# Patient Record
Sex: Male | Born: 1935
Health system: Southern US, Community
[De-identification: ages and names within clinical notes are randomized; demographics above are authoritative.]

## PROBLEM LIST (undated history)

## (undated) DIAGNOSIS — N189 Chronic kidney disease, unspecified: Secondary | ICD-10-CM

## (undated) DIAGNOSIS — N400 Enlarged prostate without lower urinary tract symptoms: Secondary | ICD-10-CM

## (undated) DIAGNOSIS — I10 Essential (primary) hypertension: Secondary | ICD-10-CM

## (undated) HISTORY — PX: TONSILLECTOMY: SUR1361

---

## 2004-08-21 ENCOUNTER — Inpatient Hospital Stay: Payer: Self-pay | Admitting: Internal Medicine

## 2004-08-21 ENCOUNTER — Other Ambulatory Visit: Payer: Self-pay

## 2009-04-29 ENCOUNTER — Ambulatory Visit: Payer: Self-pay | Admitting: Unknown Physician Specialty

## 2011-04-19 ENCOUNTER — Other Ambulatory Visit: Payer: Self-pay | Admitting: Physician Assistant

## 2012-10-18 DIAGNOSIS — I82409 Acute embolism and thrombosis of unspecified deep veins of unspecified lower extremity: Secondary | ICD-10-CM

## 2012-10-18 HISTORY — DX: Acute embolism and thrombosis of unspecified deep veins of unspecified lower extremity: I82.409

## 2017-10-18 DIAGNOSIS — I499 Cardiac arrhythmia, unspecified: Secondary | ICD-10-CM

## 2017-10-18 HISTORY — DX: Cardiac arrhythmia, unspecified: I49.9

## 2020-01-17 ENCOUNTER — Other Ambulatory Visit: Payer: Self-pay | Admitting: Student

## 2020-01-17 ENCOUNTER — Other Ambulatory Visit: Payer: Self-pay

## 2020-01-17 ENCOUNTER — Other Ambulatory Visit: Payer: Self-pay | Admitting: Family Medicine

## 2020-01-17 ENCOUNTER — Ambulatory Visit
Admission: RE | Admit: 2020-01-17 | Discharge: 2020-01-17 | Disposition: A | Discharge status: Home / Self Care | Payer: Medicare Other | Source: Ambulatory Visit | Attending: Family Medicine | Admitting: Family Medicine

## 2020-01-17 DIAGNOSIS — M542 Cervicalgia: Secondary | ICD-10-CM | POA: Insufficient documentation

## 2020-01-17 DIAGNOSIS — I6782 Cerebral ischemia: Secondary | ICD-10-CM | POA: Diagnosis not present

## 2020-01-17 DIAGNOSIS — W19XXXA Unspecified fall, initial encounter: Secondary | ICD-10-CM

## 2020-01-17 DIAGNOSIS — S12001A Unspecified nondisplaced fracture of first cervical vertebra, initial encounter for closed fracture: Secondary | ICD-10-CM

## 2020-01-17 DIAGNOSIS — R519 Headache, unspecified: Secondary | ICD-10-CM | POA: Diagnosis present

## 2020-01-17 DIAGNOSIS — Y939 Activity, unspecified: Secondary | ICD-10-CM | POA: Insufficient documentation

## 2020-01-17 DIAGNOSIS — Y92538 Other ambulatory health services establishments as the place of occurrence of the external cause: Secondary | ICD-10-CM | POA: Insufficient documentation

## 2020-01-17 DIAGNOSIS — S12000A Unspecified displaced fracture of first cervical vertebra, initial encounter for closed fracture: Secondary | ICD-10-CM | POA: Diagnosis not present

## 2020-01-21 ENCOUNTER — Ambulatory Visit
Admission: RE | Admit: 2020-01-21 | Discharge: 2020-01-21 | Disposition: A | Discharge status: Home / Self Care | Payer: Medicare Other | Source: Ambulatory Visit | Attending: Internal Medicine | Admitting: Internal Medicine

## 2020-01-21 ENCOUNTER — Other Ambulatory Visit: Payer: Self-pay | Admitting: Internal Medicine

## 2020-01-21 ENCOUNTER — Other Ambulatory Visit: Payer: Self-pay

## 2020-01-21 DIAGNOSIS — S12001A Unspecified nondisplaced fracture of first cervical vertebra, initial encounter for closed fracture: Secondary | ICD-10-CM | POA: Insufficient documentation

## 2020-01-22 ENCOUNTER — Ambulatory Visit
Admission: RE | Admit: 2020-01-22 | Discharge: 2020-01-22 | Disposition: A | Discharge status: Home / Self Care | Payer: Medicare Other | Source: Ambulatory Visit | Attending: Student | Admitting: Student

## 2020-01-22 ENCOUNTER — Other Ambulatory Visit: Payer: Self-pay

## 2020-01-22 DIAGNOSIS — S12001A Unspecified nondisplaced fracture of first cervical vertebra, initial encounter for closed fracture: Secondary | ICD-10-CM | POA: Insufficient documentation

## 2020-06-13 ENCOUNTER — Ambulatory Visit: Payer: Self-pay

## 2020-07-07 ENCOUNTER — Ambulatory Visit: Payer: Medicare Other | Attending: Internal Medicine

## 2020-07-07 DIAGNOSIS — Z23 Encounter for immunization: Secondary | ICD-10-CM

## 2020-07-07 NOTE — Progress Notes (Signed)
   Covid-19 Vaccination Clinic  Name:  Alexander Lane    MRN: [id number] DOB: 10-25-1929  07/07/2020  [name] was observed post Covid-19 immunization for 15 minutes without incident. He was provided with Vaccine Information Sheet and instruction to access the V-Safe system.   [name] was instructed to call 911 with any severe reactions post vaccine: Marland Kitchen Difficulty breathing  . Swelling of face and throat  . A fast heartbeat  . A bad rash all over body  . Dizziness and weakness

## 2021-02-18 ENCOUNTER — Other Ambulatory Visit: Payer: Self-pay | Admitting: Gastroenterology

## 2021-02-18 DIAGNOSIS — R1310 Dysphagia, unspecified: Secondary | ICD-10-CM

## 2021-02-23 ENCOUNTER — Ambulatory Visit
Admission: RE | Admit: 2021-02-23 | Discharge: 2021-02-23 | Disposition: A | Discharge status: Home / Self Care | Payer: Medicare Other | Source: Ambulatory Visit | Attending: Gastroenterology | Admitting: Gastroenterology

## 2021-02-23 ENCOUNTER — Other Ambulatory Visit: Payer: Self-pay

## 2021-02-23 DIAGNOSIS — R1310 Dysphagia, unspecified: Secondary | ICD-10-CM | POA: Insufficient documentation

## 2021-03-05 ENCOUNTER — Encounter: Payer: Self-pay | Admitting: *Deleted

## 2021-03-06 ENCOUNTER — Encounter: Payer: Self-pay | Admitting: *Deleted

## 2021-03-06 ENCOUNTER — Ambulatory Visit: Payer: Medicare Other | Admitting: Certified Registered"

## 2021-03-06 ENCOUNTER — Encounter
Admission: RE | Disposition: A | Discharge status: Home / Self Care | Payer: Self-pay | Source: Home / Self Care | Attending: Gastroenterology

## 2021-03-06 ENCOUNTER — Ambulatory Visit
Admission: RE | Admit: 2021-03-06 | Discharge: 2021-03-06 | Disposition: A | Discharge status: Home / Self Care | Payer: Medicare Other | Source: Home / Self Care | Attending: Gastroenterology | Admitting: Gastroenterology

## 2021-03-06 DIAGNOSIS — K449 Diaphragmatic hernia without obstruction or gangrene: Secondary | ICD-10-CM | POA: Insufficient documentation

## 2021-03-06 DIAGNOSIS — R1310 Dysphagia, unspecified: Secondary | ICD-10-CM | POA: Insufficient documentation

## 2021-03-06 DIAGNOSIS — Z87891 Personal history of nicotine dependence: Secondary | ICD-10-CM | POA: Insufficient documentation

## 2021-03-06 DIAGNOSIS — Z886 Allergy status to analgesic agent status: Secondary | ICD-10-CM | POA: Insufficient documentation

## 2021-03-06 DIAGNOSIS — K222 Esophageal obstruction: Secondary | ICD-10-CM | POA: Insufficient documentation

## 2021-03-06 DIAGNOSIS — Q398 Other congenital malformations of esophagus: Secondary | ICD-10-CM | POA: Insufficient documentation

## 2021-03-06 DIAGNOSIS — Z79899 Other long term (current) drug therapy: Secondary | ICD-10-CM | POA: Insufficient documentation

## 2021-03-06 DIAGNOSIS — Z7901 Long term (current) use of anticoagulants: Secondary | ICD-10-CM | POA: Insufficient documentation

## 2021-03-06 HISTORY — DX: Essential (primary) hypertension: I10

## 2021-03-06 HISTORY — PX: ESOPHAGOGASTRODUODENOSCOPY (EGD) WITH PROPOFOL: SHX5813

## 2021-03-06 HISTORY — DX: Chronic kidney disease, unspecified: N18.9

## 2021-03-06 HISTORY — DX: Benign prostatic hyperplasia without lower urinary tract symptoms: N40.0

## 2021-03-06 SURGERY — ESOPHAGOGASTRODUODENOSCOPY (EGD) WITH PROPOFOL
Anesthesia: General

## 2021-03-06 MED ORDER — LIDOCAINE HCL (CARDIAC) PF 100 MG/5ML IV SOSY
PREFILLED_SYRINGE | INTRAVENOUS | Status: DC | PRN
  Administered 2021-03-06: 40 mg via INTRAVENOUS

## 2021-03-06 MED ORDER — PROPOFOL 500 MG/50ML IV EMUL
INTRAVENOUS | Status: DC | PRN
  Administered 2021-03-06: 100 ug/kg/min via INTRAVENOUS

## 2021-03-06 MED ORDER — SODIUM CHLORIDE 0.9 % IV SOLN
INTRAVENOUS | Status: DC
  Administered 2021-03-06: 20 mL/h via INTRAVENOUS

## 2021-03-06 MED ORDER — PROPOFOL 10 MG/ML IV BOLUS
INTRAVENOUS | Status: DC | PRN
  Administered 2021-03-06: 20 mg via INTRAVENOUS
  Administered 2021-03-06: 30 mg via INTRAVENOUS
  Administered 2021-03-06: 10 mg via INTRAVENOUS

## 2021-03-06 NOTE — Anesthesia Postprocedure Evaluation (Signed)
Anesthesia Post Note  Patient: Alexander Lane  Procedure(s) Performed: ESOPHAGOGASTRODUODENOSCOPY (EGD) WITH PROPOFOL (N/A )  Patient location during evaluation: PACU Anesthesia Type: General Level of consciousness: awake and alert and oriented Pain management: pain level controlled Vital Signs Assessment: post-procedure vital signs reviewed and stable Respiratory status: spontaneous breathing Cardiovascular status: blood pressure returned to baseline Anesthetic complications: no   No complications documented.   Last Vitals:  Vitals:   03/06/21 1000 03/06/21 1010  BP: 127/77 (!) 154/94  Pulse: 72 74  Resp: (!) 24 17  Temp:    SpO2: 97% 97%    Last Pain:  Vitals:   03/06/21 0823  TempSrc: Temporal  PainSc: 0-No pain                 Teva Bronkema

## 2021-03-06 NOTE — Interval H&P Note (Signed)
History and Physical Interval Note:  03/06/2021 9:32 AM  Alexander Lane  has presented today for surgery, with the diagnosis of dysphagia.  The various methods of treatment have been discussed with the patient and family. After consideration of risks, benefits and other options for treatment, the patient has consented to  Procedure(s): ESOPHAGOGASTRODUODENOSCOPY (EGD) WITH PROPOFOL (N/A) as a surgical intervention.  The patient's history has been reviewed, patient examined, no change in status, stable for surgery.  I have reviewed the patient's chart and labs.  Questions were answered to the patient's satisfaction.     [name]  Ok to proceed with EGD

## 2021-03-06 NOTE — Op Note (Signed)
[facility] Patient Name: Alexander Lane Procedure Date: 03/06/2021 9:25 AM MRN: [id number] Account #: [id number] Date of Birth: 07/17/1930 Admit Type: Outpatient Age: 87 Room: [facility] ENDO ROOM 3 Gender: Male Note Status: Finalized Procedure:             Upper GI endoscopy Indications:           Dysphagia Providers:             Andrey Farmer MD, MD Referring MD:          Rusty Aus, MD (Referring MD) Medicines:             Monitored Anesthesia Care Complications:         No immediate complications. Estimated blood loss:                         Minimal. Procedure:             Pre-Anesthesia Assessment:                        - Prior to the procedure, a History and Physical was                         performed, and patient medications and allergies were                         reviewed. The patient is competent. The risks and                         benefits of the procedure and the sedation options and                         risks were discussed with the patient. All questions                         were answered and informed consent was obtained.                         Patient identification and proposed procedure were                         verified by the physician, the nurse, the anesthetist                         and the technician in the endoscopy suite. Mental                         Status Examination: alert and oriented. Airway                         Examination: normal oropharyngeal airway and neck                         mobility. Respiratory Examination: clear to                         auscultation. CV Examination: normal. Prophylactic                         Antibiotics:  The patient does not require prophylactic                         antibiotics. Prior Anticoagulants: The patient has                         taken Coumadin (warfarin), last dose was 7 days prior                         to procedure. ASA Grade Assessment:  III - A patient                         with severe systemic disease. After reviewing the                         risks and benefits, the patient was deemed in                         satisfactory condition to undergo the procedure. The                         anesthesia plan was to use monitored anesthesia care                         (MAC). Immediately prior to administration of                         medications, the patient was re-assessed for adequacy                         to receive sedatives. The heart rate, respiratory                         rate, oxygen saturations, blood pressure, adequacy of                         pulmonary ventilation, and response to care were                         monitored throughout the procedure. The physical                         status of the patient was re-assessed after the                         procedure.                        After obtaining informed consent, the endoscope was                         passed under direct vision. Throughout the procedure,                         the patient's blood pressure, pulse, and oxygen                         saturations were monitored continuously. The Endoscope  was introduced through the mouth, and advanced to the                         second part of duodenum. The upper GI endoscopy was                         accomplished without difficulty. The patient tolerated                         the procedure well. Findings:      A moderate Schatzki ring was found at the gastroesophageal junction. A       TTS dilator was passed through the scope. Dilation with a 12-13.5-15 mm       balloon dilator was performed to 15 mm. The dilation site was examined       and showed moderate mucosal disruption. Estimated blood loss was minimal.      A 4 cm hiatal hernia was present.      The examined esophagus was tortuous.      The entire examined stomach was normal.      The examined  duodenum was normal. Impression:            - Moderate Schatzki ring. Dilated.                        - 4 cm hiatal hernia.                        - Tortuous esophagus.                        - Normal stomach.                        - Normal examined duodenum.                        - No specimens collected. Recommendation:        - Discharge patient to home.                        - Advance diet as tolerated.                        - Resume Coumadin (warfarin) at prior dose today.                        - Use Protonix (pantoprazole) 40 mg PO BID.                        - Repeat upper endoscopy in 4 weeks for retreatment.                        - Return to referring physician as previously                         scheduled. Procedure Code(s):     --- Professional ---                        [phone], Esophagogastroduodenoscopy, flexible,  transoral; with transendoscopic balloon dilation of                         esophagus (less than 30 mm diameter) Diagnosis Code(s):     --- Professional ---                        K22.2, Esophageal obstruction                        K44.9, Diaphragmatic hernia without obstruction or                         gangrene                        Q39.9, Congenital malformation of esophagus,                         unspecified                        R13.10, Dysphagia, unspecified CPT copyright 2019 American Medical Association. All rights reserved. The codes documented in this report are preliminary and upon coder review may  be revised to meet current compliance requirements. Andrey Farmer MD, MD 03/06/2021 9:53:42 AM Number of Addenda: 0 Note Initiated On: 03/06/2021 9:25 AM Estimated Blood Loss:  Estimated blood loss was minimal.      [facility]

## 2021-03-06 NOTE — Transfer of Care (Signed)
Immediate Anesthesia Transfer of Care Note  Patient: CLIFTON SAFLEY  Procedure(s) Performed: ESOPHAGOGASTRODUODENOSCOPY (EGD) WITH PROPOFOL (N/A )  Patient Location: PACU  Anesthesia Type:General  Level of Consciousness: awake and alert   Airway & Oxygen Therapy: Patient Spontanous Breathing  Post-op Assessment: Report given to RN and Post -op Vital signs reviewed and stable  Post vital signs: Reviewed and stable  Last Vitals:  Vitals Value Taken Time  BP 121/67 03/06/21 0951  Temp    Pulse 72 03/06/21 0952  Resp 19 03/06/21 0952  SpO2 99 % 03/06/21 0952  Vitals shown include unvalidated device data.  Last Pain:  Vitals:   03/06/21 0823  TempSrc: Temporal  PainSc: 0-No pain         Complications: No complications documented.

## 2021-03-06 NOTE — H&P (Signed)
Outpatient short stay form Pre-procedure 03/06/2021 9:29 AM Alexander Miyamoto MD, MPH  Primary Physician: Dr. Sabra Heck  Reason for visit:  Dysphagia  History of present illness:   87 y/o gentleman with history of smoking here for 3 month history of dysphagia to solids. Takes coumadin but has not had anything for one week. No family history of GI malignancies.    Current Facility-Administered Medications:  .  0.9 %  sodium chloride infusion, , Intravenous, Continuous, Aira Sallade, Hilton Cork, MD, Last Rate: 20 mL/hr at 03/06/21 0834, 20 mL/hr at 03/06/21 0834  Medications Prior to Admission  Medication Sig Dispense Refill Last Dose  . acetaminophen (TYLENOL) 500 MG tablet Take 500 mg by mouth.   Past Week at Unknown time  . amLODipine (NORVASC) 2.5 MG tablet Take 2.5 mg by mouth at bedtime.   03/05/2021 at Unknown time  . loratadine (CLARITIN) 10 MG tablet Take 10 mg by mouth daily as needed for allergies.   Past Week at Unknown time  . olmesartan (BENICAR) 20 MG tablet Take 10 mg by mouth daily.   Past Week at Unknown time  . pantoprazole (PROTONIX) 40 MG tablet Take 40 mg by mouth daily.   Past Week at Unknown time  . simvastatin (ZOCOR) 20 MG tablet Take 20 mg by mouth at bedtime.   03/05/2021 at Unknown time  . terazosin (HYTRIN) 5 MG capsule Take 5 mg by mouth at bedtime.   Past Week at Unknown time  . warfarin (COUMADIN) 5 MG tablet Take 5 mg by mouth daily. Alternating with 1 and 1/2 tablets (7.5mg ) every other day   Past Week at Unknown time     Allergies  Allergen Reactions  . Celebrex [Celecoxib]      Past Medical History:  Diagnosis Date  . BPH (benign prostatic hyperplasia)   . Chronic kidney disease   . DVT (deep venous thrombosis) ([place]) 2014   right leg, IVC filter  . Dysrhythmia 2019   Paroxysmal atrial fibrillation  . Hypertension     Review of systems:  Otherwise negative.    Physical Exam  Gen: Alert, oriented. Appears stated age.  HEENT: PERRLA. Lungs: No  respiratory distress CV: RRR Abd: soft, benign, no masses Ext: No edema.     Planned procedures: Proceed with EGD. The patient understands the nature of the planned procedure, indications, risks, alternatives and potential complications including but not limited to bleeding, infection, perforation, damage to internal organs and possible oversedation/side effects from anesthesia. The patient agrees and gives consent to proceed.  Please refer to procedure notes for findings, recommendations and patient disposition/instructions.     Alexander Miyamoto MD, MPH Gastroenterology 03/06/2021  9:29 AM

## 2021-03-06 NOTE — Anesthesia Preprocedure Evaluation (Signed)
Anesthesia Evaluation  Patient identified by MRN, date of birth, ID band Patient awake    Reviewed: Allergy & Precautions, NPO status , Patient's Chart, lab work & pertinent test results  Airway Mallampati: II  TM Distance: >3 FB     Dental   Pulmonary neg pulmonary ROS,    Pulmonary exam normal        Cardiovascular hypertension, + DVT  Normal cardiovascular exam+ dysrhythmias Atrial Fibrillation      Neuro/Psych negative neurological ROS  negative psych ROS   GI/Hepatic negative GI ROS, Neg liver ROS,   Endo/Other  negative endocrine ROS  Renal/GU Renal InsufficiencyRenal disease  negative genitourinary   Musculoskeletal   Abdominal Normal abdominal exam  (+)   Peds negative pediatric ROS (+)  Hematology negative hematology ROS (+)   Anesthesia Other Findings Past Medical History: No date: BPH (benign prostatic hyperplasia) No date: Chronic kidney disease 2014: DVT (deep venous thrombosis) ([facility])     Comment:  right leg, IVC filter 2019: Dysrhythmia     Comment:  Paroxysmal atrial fibrillation No date: Hypertension  Reproductive/Obstetrics                             Anesthesia Physical Anesthesia Plan  ASA: III  Anesthesia Plan: General   Post-op Pain Management:    Induction: Intravenous  PONV Risk Score and Plan: Propofol infusion  Airway Management Planned: Nasal Cannula  Additional Equipment:   Intra-op Plan:   Post-operative Plan:   Informed Consent: I have reviewed the patients History and Physical, chart, labs and discussed the procedure including the risks, benefits and alternatives for the proposed anesthesia with the patient or authorized representative who has indicated his/her understanding and acceptance.     Dental advisory given  Plan Discussed with: CRNA and Surgeon  Anesthesia Plan Comments:         Anesthesia Quick Evaluation

## 2021-03-08 ENCOUNTER — Emergency Department: Payer: Medicare Other

## 2021-03-08 ENCOUNTER — Encounter: Payer: Self-pay | Admitting: Internal Medicine

## 2021-03-08 ENCOUNTER — Other Ambulatory Visit: Payer: Self-pay

## 2021-03-08 ENCOUNTER — Inpatient Hospital Stay
Admission: EM | Admit: 2021-03-08 | Discharge: 2021-03-18 | DRG: 177 | Disposition: A | Discharge status: Skilled Nursing Facility | Payer: Medicare Other | Attending: Internal Medicine | Admitting: Internal Medicine

## 2021-03-08 ENCOUNTER — Inpatient Hospital Stay: Payer: Medicare Other

## 2021-03-08 DIAGNOSIS — D329 Benign neoplasm of meninges, unspecified: Secondary | ICD-10-CM | POA: Diagnosis not present

## 2021-03-08 DIAGNOSIS — I10 Essential (primary) hypertension: Secondary | ICD-10-CM | POA: Diagnosis present

## 2021-03-08 DIAGNOSIS — Z87891 Personal history of nicotine dependence: Secondary | ICD-10-CM

## 2021-03-08 DIAGNOSIS — G936 Cerebral edema: Secondary | ICD-10-CM | POA: Diagnosis present

## 2021-03-08 DIAGNOSIS — N183 Chronic kidney disease, stage 3 unspecified: Secondary | ICD-10-CM | POA: Diagnosis present

## 2021-03-08 DIAGNOSIS — Z86718 Personal history of other venous thrombosis and embolism: Secondary | ICD-10-CM

## 2021-03-08 DIAGNOSIS — I82409 Acute embolism and thrombosis of unspecified deep veins of unspecified lower extremity: Secondary | ICD-10-CM | POA: Diagnosis present

## 2021-03-08 DIAGNOSIS — Z515 Encounter for palliative care: Secondary | ICD-10-CM | POA: Diagnosis not present

## 2021-03-08 DIAGNOSIS — D320 Benign neoplasm of cerebral meninges: Secondary | ICD-10-CM | POA: Diagnosis present

## 2021-03-08 DIAGNOSIS — E785 Hyperlipidemia, unspecified: Secondary | ICD-10-CM | POA: Diagnosis present

## 2021-03-08 DIAGNOSIS — E860 Dehydration: Secondary | ICD-10-CM | POA: Diagnosis present

## 2021-03-08 DIAGNOSIS — W19XXXA Unspecified fall, initial encounter: Secondary | ICD-10-CM | POA: Diagnosis not present

## 2021-03-08 DIAGNOSIS — K222 Esophageal obstruction: Secondary | ICD-10-CM | POA: Diagnosis present

## 2021-03-08 DIAGNOSIS — J1282 Pneumonia due to coronavirus disease 2019: Secondary | ICD-10-CM | POA: Diagnosis present

## 2021-03-08 DIAGNOSIS — R531 Weakness: Secondary | ICD-10-CM

## 2021-03-08 DIAGNOSIS — I480 Paroxysmal atrial fibrillation: Secondary | ICD-10-CM | POA: Diagnosis present

## 2021-03-08 DIAGNOSIS — Z7901 Long term (current) use of anticoagulants: Secondary | ICD-10-CM

## 2021-03-08 DIAGNOSIS — Z79899 Other long term (current) drug therapy: Secondary | ICD-10-CM

## 2021-03-08 DIAGNOSIS — G935 Compression of brain: Secondary | ICD-10-CM | POA: Diagnosis present

## 2021-03-08 DIAGNOSIS — G9389 Other specified disorders of brain: Secondary | ICD-10-CM | POA: Diagnosis not present

## 2021-03-08 DIAGNOSIS — K449 Diaphragmatic hernia without obstruction or gangrene: Secondary | ICD-10-CM | POA: Diagnosis present

## 2021-03-08 DIAGNOSIS — N179 Acute kidney failure, unspecified: Secondary | ICD-10-CM | POA: Diagnosis present

## 2021-03-08 DIAGNOSIS — I129 Hypertensive chronic kidney disease with stage 1 through stage 4 chronic kidney disease, or unspecified chronic kidney disease: Secondary | ICD-10-CM | POA: Diagnosis present

## 2021-03-08 DIAGNOSIS — Z888 Allergy status to other drugs, medicaments and biological substances status: Secondary | ICD-10-CM

## 2021-03-08 DIAGNOSIS — J690 Pneumonitis due to inhalation of food and vomit: Secondary | ICD-10-CM

## 2021-03-08 DIAGNOSIS — I160 Hypertensive urgency: Secondary | ICD-10-CM | POA: Diagnosis present

## 2021-03-08 DIAGNOSIS — Q399 Congenital malformation of esophagus, unspecified: Secondary | ICD-10-CM

## 2021-03-08 DIAGNOSIS — A419 Sepsis, unspecified organism: Secondary | ICD-10-CM | POA: Diagnosis present

## 2021-03-08 DIAGNOSIS — J9601 Acute respiratory failure with hypoxia: Secondary | ICD-10-CM | POA: Diagnosis present

## 2021-03-08 DIAGNOSIS — Z7189 Other specified counseling: Secondary | ICD-10-CM | POA: Diagnosis not present

## 2021-03-08 DIAGNOSIS — D496 Neoplasm of unspecified behavior of brain: Secondary | ICD-10-CM

## 2021-03-08 DIAGNOSIS — Y92009 Unspecified place in unspecified non-institutional (private) residence as the place of occurrence of the external cause: Secondary | ICD-10-CM

## 2021-03-08 DIAGNOSIS — G9341 Metabolic encephalopathy: Secondary | ICD-10-CM | POA: Diagnosis present

## 2021-03-08 DIAGNOSIS — U071 COVID-19: Secondary | ICD-10-CM | POA: Diagnosis present

## 2021-03-08 DIAGNOSIS — N400 Enlarged prostate without lower urinary tract symptoms: Secondary | ICD-10-CM | POA: Diagnosis present

## 2021-03-08 DIAGNOSIS — K219 Gastro-esophageal reflux disease without esophagitis: Secondary | ICD-10-CM | POA: Diagnosis present

## 2021-03-08 DIAGNOSIS — R6510 Systemic inflammatory response syndrome (SIRS) of non-infectious origin without acute organ dysfunction: Secondary | ICD-10-CM | POA: Diagnosis present

## 2021-03-08 DIAGNOSIS — N1831 Chronic kidney disease, stage 3a: Secondary | ICD-10-CM | POA: Diagnosis present

## 2021-03-08 DIAGNOSIS — Z808 Family history of malignant neoplasm of other organs or systems: Secondary | ICD-10-CM

## 2021-03-08 DIAGNOSIS — R748 Abnormal levels of other serum enzymes: Secondary | ICD-10-CM | POA: Diagnosis present

## 2021-03-08 DIAGNOSIS — Z86011 Personal history of benign neoplasm of the brain: Secondary | ICD-10-CM

## 2021-03-08 LAB — CBC WITH DIFFERENTIAL/PLATELET
Abs Immature Granulocytes: 0.02 10*3/uL (ref 0.00–0.07)
Basophils Absolute: 0 10*3/uL (ref 0.0–0.1)
Basophils Relative: 1 %
Eosinophils Absolute: 0 10*3/uL (ref 0.0–0.5)
Eosinophils Relative: 0 %
HCT: 36.7 % — ABNORMAL LOW (ref 39.0–52.0)
Hemoglobin: 12.3 g/dL — ABNORMAL LOW (ref 13.0–17.0)
Immature Granulocytes: 0 %
Lymphocytes Relative: 13 %
Lymphs Abs: 0.8 10*3/uL (ref 0.7–4.0)
MCH: 30.2 pg (ref 26.0–34.0)
MCHC: 33.5 g/dL (ref 30.0–36.0)
MCV: 90.2 fL (ref 80.0–100.0)
Monocytes Absolute: 0.8 10*3/uL (ref 0.1–1.0)
Monocytes Relative: 13 %
Neutro Abs: 4.5 10*3/uL (ref 1.7–7.7)
Neutrophils Relative %: 73 %
Platelets: 187 10*3/uL (ref 150–400)
RBC: 4.07 MIL/uL — ABNORMAL LOW (ref 4.22–5.81)
RDW: 13 % (ref 11.5–15.5)
WBC: 6.1 10*3/uL (ref 4.0–10.5)
nRBC: 0 % (ref 0.0–0.2)

## 2021-03-08 LAB — TROPONIN I (HIGH SENSITIVITY): Troponin I (High Sensitivity): 14 ng/L (ref <18)

## 2021-03-08 LAB — COMPREHENSIVE METABOLIC PANEL
ALT: 15 U/L (ref 0–44)
AST: 30 U/L (ref 15–41)
Albumin: 4 g/dL (ref 3.5–5.0)
Alkaline Phosphatase: 52 U/L (ref 38–126)
Anion gap: 9 (ref 5–15)
BUN: 29 mg/dL — ABNORMAL HIGH (ref 8–23)
CO2: 26 mmol/L (ref 22–32)
Calcium: 9.2 mg/dL (ref 8.9–10.3)
Chloride: 108 mmol/L (ref 98–111)
Creatinine, Ser: 1.39 mg/dL — ABNORMAL HIGH (ref 0.61–1.24)
GFR, Estimated: 49 mL/min — ABNORMAL LOW (ref >60)
Glucose, Bld: 100 mg/dL — ABNORMAL HIGH (ref 70–99)
Potassium: 4 mmol/L (ref 3.5–5.1)
Sodium: 143 mmol/L (ref 135–145)
Total Bilirubin: 0.7 mg/dL (ref 0.3–1.2)
Total Protein: 6.5 g/dL (ref 6.5–8.1)

## 2021-03-08 LAB — PROTIME-INR
INR: 1.2 (ref 0.8–1.2)
Prothrombin Time: 14.7 seconds (ref 11.4–15.2)

## 2021-03-08 LAB — LACTIC ACID, PLASMA
Lactic Acid, Venous: 1 mmol/L (ref 0.5–1.9)
Lactic Acid, Venous: 0.8 mmol/L (ref 0.5–1.9)

## 2021-03-08 LAB — CK: Total CK: 742 U/L — ABNORMAL HIGH (ref 49–397)

## 2021-03-08 LAB — C-REACTIVE PROTEIN: CRP: 4.9 mg/dL — ABNORMAL HIGH (ref <1.0)

## 2021-03-08 LAB — RESP PANEL BY RT-PCR (FLU A&B, COVID) ARPGX2
Influenza A by PCR: NEGATIVE
Influenza B by PCR: NEGATIVE
SARS Coronavirus 2 by RT PCR: POSITIVE — AB

## 2021-03-08 LAB — PROCALCITONIN: Procalcitonin: <0.1 ng/mL

## 2021-03-08 LAB — FERRITIN: Ferritin: 111 ng/mL (ref 24–336)

## 2021-03-08 LAB — D-DIMER, QUANTITATIVE: D-Dimer, Quant: 0.99 ug/mL-FEU — ABNORMAL HIGH (ref 0.00–0.50)

## 2021-03-08 MED ORDER — WARFARIN - PHARMACIST DOSING INPATIENT: Freq: Every day | Status: DC

## 2021-03-08 MED ORDER — LACTATED RINGERS IV BOLUS
1000.0000 mL | Freq: Once | INTRAVENOUS | Status: AC
  Administered 2021-03-08: 1000 mL via INTRAVENOUS

## 2021-03-08 MED ORDER — SODIUM CHLORIDE 0.9 % IV SOLN
200.0000 mg | Freq: Once | INTRAVENOUS | Status: AC
  Administered 2021-03-08: 200 mg via INTRAVENOUS
  Filled 2021-03-08: qty 200

## 2021-03-08 MED ORDER — PANTOPRAZOLE SODIUM 40 MG PO TBEC
40.0000 mg | DELAYED_RELEASE_TABLET | Freq: Two times a day (BID) | ORAL | Status: DC
  Administered 2021-03-08 – 2021-03-18 (×20): 40 mg via ORAL
  Filled 2021-03-08 (×21): qty 1

## 2021-03-08 MED ORDER — TERAZOSIN HCL 5 MG PO CAPS
5.0000 mg | ORAL_CAPSULE | Freq: Every day | ORAL | Status: DC
  Administered 2021-03-08 – 2021-03-17 (×10): 5 mg via ORAL
  Filled 2021-03-08 (×11): qty 1

## 2021-03-08 MED ORDER — DEXAMETHASONE SODIUM PHOSPHATE 10 MG/ML IJ SOLN
10.0000 mg | Freq: Once | INTRAMUSCULAR | Status: AC
  Administered 2021-03-08: 10 mg via INTRAVENOUS
  Filled 2021-03-08: qty 1

## 2021-03-08 MED ORDER — LEVETIRACETAM IN NACL 500 MG/100ML IV SOLN
500.0000 mg | Freq: Two times a day (BID) | INTRAVENOUS | Status: DC
  Administered 2021-03-08 (×2): 500 mg via INTRAVENOUS
  Filled 2021-03-08 (×4): qty 100

## 2021-03-08 MED ORDER — DM-GUAIFENESIN ER 30-600 MG PO TB12
1.0000 | ORAL_TABLET | Freq: Two times a day (BID) | ORAL | Status: DC | PRN
  Administered 2021-03-11 – 2021-03-15 (×4): 1 via ORAL
  Filled 2021-03-08 (×4): qty 1

## 2021-03-08 MED ORDER — SIMVASTATIN 20 MG PO TABS
20.0000 mg | ORAL_TABLET | Freq: Every day | ORAL | Status: DC
  Administered 2021-03-08 – 2021-03-17 (×10): 20 mg via ORAL
  Filled 2021-03-08 (×9): qty 1

## 2021-03-08 MED ORDER — IPRATROPIUM BROMIDE HFA 17 MCG/ACT IN AERS
2.0000 | INHALATION_SPRAY | RESPIRATORY_TRACT | Status: DC
  Administered 2021-03-08 – 2021-03-18 (×59): 2 via RESPIRATORY_TRACT
  Filled 2021-03-08 (×4): qty 12.9

## 2021-03-08 MED ORDER — LORAZEPAM 2 MG/ML IJ SOLN: 1.0000 mg | INTRAMUSCULAR | Status: DC | PRN

## 2021-03-08 MED ORDER — ACETAMINOPHEN 325 MG PO TABS: 650.0000 mg | ORAL_TABLET | Freq: Four times a day (QID) | ORAL | Status: DC | PRN

## 2021-03-08 MED ORDER — GADOBUTROL 1 MMOL/ML IV SOLN
7.5000 mL | Freq: Once | INTRAVENOUS | Status: AC | PRN
  Administered 2021-03-08: 7.5 mL via INTRAVENOUS

## 2021-03-08 MED ORDER — DEXAMETHASONE 4 MG PO TABS
4.0000 mg | ORAL_TABLET | Freq: Four times a day (QID) | ORAL | Status: DC
  Administered 2021-03-08 – 2021-03-18 (×39): 4 mg via ORAL
  Filled 2021-03-08 (×39): qty 1

## 2021-03-08 MED ORDER — SODIUM CHLORIDE 0.9 % IV SOLN
100.0000 mg | Freq: Every day | INTRAVENOUS | Status: AC
  Administered 2021-03-09 – 2021-03-12 (×4): 100 mg via INTRAVENOUS
  Filled 2021-03-08: qty 20
  Filled 2021-03-08 (×4): qty 100

## 2021-03-08 MED ORDER — IRBESARTAN 150 MG PO TABS
150.0000 mg | ORAL_TABLET | Freq: Every day | ORAL | Status: DC
  Administered 2021-03-08 – 2021-03-18 (×10): 150 mg via ORAL
  Filled 2021-03-08 (×11): qty 1

## 2021-03-08 MED ORDER — HYDRALAZINE HCL 20 MG/ML IJ SOLN
5.0000 mg | INTRAMUSCULAR | Status: DC | PRN
  Filled 2021-03-08: qty 1

## 2021-03-08 MED ORDER — AMLODIPINE BESYLATE 5 MG PO TABS
2.5000 mg | ORAL_TABLET | Freq: Every day | ORAL | Status: DC
  Administered 2021-03-08 – 2021-03-11 (×3): 2.5 mg via ORAL
  Filled 2021-03-08 (×4): qty 1

## 2021-03-08 MED ORDER — DEXAMETHASONE SODIUM PHOSPHATE 10 MG/ML IJ SOLN: 10.0000 mg | Freq: Once | INTRAMUSCULAR | Status: DC

## 2021-03-08 MED ORDER — ALBUTEROL SULFATE HFA 108 (90 BASE) MCG/ACT IN AERS
2.0000 | INHALATION_SPRAY | RESPIRATORY_TRACT | Status: DC | PRN
  Filled 2021-03-08: qty 6.7

## 2021-03-08 MED ORDER — WARFARIN SODIUM 7.5 MG PO TABS
7.5000 mg | ORAL_TABLET | Freq: Once | ORAL | Status: AC
  Administered 2021-03-08: 17:00:00 7.5 mg via ORAL
  Filled 2021-03-08: qty 1

## 2021-03-08 MED ORDER — ACETAMINOPHEN 650 MG RE SUPP: 650.0000 mg | Freq: Four times a day (QID) | RECTAL | Status: DC | PRN

## 2021-03-08 NOTE — Consult Note (Signed)
Remdesivir - Pharmacy Brief Note   O:  ALT: 15 CXR: "No acute cardiopulmonary abnormality" SpO2: On RA   A/P:  5/22 SARS-CoV-2 PCR (+)  Remdesivir 200 mg IVPB once followed by 100 mg IVPB daily x 4 days.   Benita Gutter 03/08/2021 9:36 AM

## 2021-03-08 NOTE — ED Notes (Signed)
Report given to Kate, RN

## 2021-03-08 NOTE — Consult Note (Signed)
Oct[facility] for Warfarin Indication: History of DVT / Afib  Patient Measurements: Height: 5\' 7"  (170.2 cm) Weight: 83.9 kg (185 lb) IBW/kg (Calculated) : 66.1  Labs: Recent Labs    03/08/21 0535  HGB 12.3*  HCT 36.7*  PLT 187  LABPROT 14.7  INR 1.2  CREATININE 1.39*  TROPONINIHS 14    Estimated Creatinine Clearance: 38 mL/min (A) (by C-G formula based on SCr of 1.39 mg/dL (H)).   Medical History: Past Medical History:  Diagnosis Date  . BPH (benign prostatic hyperplasia)   . Chronic kidney disease   . DVT (deep venous thrombosis) ([place]) 2014   right leg, IVC filter  . Dysrhythmia 2019   Paroxysmal atrial fibrillation  . Hypertension     Medications:  Warfarin 5 mg daily prior to admission per med rec  Per [facility] records: warfarin 5 mg alternating with 7.5 mg every other day  Last patient reported dose 5/21 at 1800  Assessment: Patient is an 87 y/o M with medical history as above including Afib / DVT s/p IVC filter on warfarin who presented with generalized weakness / falls. Subsequently found to be positive for COVID-19 infection. 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 Pharmacy has been consulted for management of warfarin while patient is in-house.   Baseline CBC with Hgb 12.3, Plt 187 Albumin: 4, other LFTs WNL DDIs: Dexamethasone  Date INR Plan  5/22 1.2 Warfarin 7.5 mg    Goal of Therapy:  INR 2-3   Plan:  --Suspect INR low given patient was holding warfarin for EGD on 03/06/21 and likely just resumed within last 1-2 days --Will give warfarin 7.5 mg tonight --Daily INR per protocol --CBC at least every 3 days per protocol  Benita Gutter 03/08/2021,10:50 AM

## 2021-03-08 NOTE — ED Provider Notes (Signed)
[facility] Emergency Department Provider Note  ____________________________________________  Time seen: Approximately 6:30 AM  I have reviewed the triage vital signs and the nursing notes.   HISTORY  Chief Complaint Weakness and Fall   HPI Alexander Lane is a 87 y.o. male with a history of A. fib on Coumadin, DVT, BPH, CKD, hypertension who presents for evaluation of generalized weakness and fall.  Patient has had 2 falls last night.  After the first 1 and the family was able to assist him up but the second time they were unable due to generalized weakness.  Wife reports that the only thing different that happened over the last 2 days was a colonoscopy 48 hours ago.  He has had no cough, no chest pain, no shortness of breath, no headache, no fever, no abdominal pain, no vomiting or diarrhea, no dysuria or hematuria.  Patient just feels very weak.  He denies any injuries from the fall.   Past Medical History:  Diagnosis Date  . BPH (benign prostatic hyperplasia)   . Chronic kidney disease   . DVT (deep venous thrombosis) ([place]) 2014   right leg, IVC filter  . Dysrhythmia 2019   Paroxysmal atrial fibrillation  . Hypertension     There are no problems to display for this patient.   Past Surgical History:  Procedure Laterality Date  . TONSILLECTOMY      Prior to Admission medications   Medication Sig Start Date End Date Taking? Authorizing Provider  acetaminophen (TYLENOL) 500 MG tablet Take 500 mg by mouth.    [provider]  amLODipine (NORVASC) 2.5 MG tablet Take 2.5 mg by mouth at bedtime.    [provider]  loratadine (CLARITIN) 10 MG tablet Take 10 mg by mouth daily as needed for allergies.    [provider]  olmesartan (BENICAR) 20 MG tablet Take 10 mg by mouth daily.    [provider]  pantoprazole (PROTONIX) 40 MG tablet Take 40 mg by mouth daily.    [provider]  simvastatin (ZOCOR) 20  MG tablet Take 20 mg by mouth at bedtime.    [provider]  terazosin (HYTRIN) 5 MG capsule Take 5 mg by mouth at bedtime.    [provider]  warfarin (COUMADIN) 5 MG tablet Take 5 mg by mouth daily. Alternating with 1 and 1/2 tablets (7.5mg ) every other day    [provider]    Allergies Celebrex [celecoxib]  FH Heart failure Mother       Social History  Smoking - no Alcohol - no Drugs - no  Review of Systems  Constitutional: Negative for fever. + generalized weakness Eyes: Negative for visual changes. ENT: Negative for sore throat. Neck: No neck pain  Cardiovascular: Negative for chest pain. Respiratory: Negative for shortness of breath. Gastrointestinal: Negative for abdominal pain, vomiting or diarrhea. Genitourinary: Negative for dysuria. Musculoskeletal: Negative for back pain. Skin: Negative for rash. Neurological: Negative for headaches, weakness or numbness. Psych: No SI or HI  ____________________________________________   PHYSICAL EXAM:  VITAL SIGNS: ED Triage Vitals [03/08/21 0529]  Enc Vitals Group     BP (!) 155/100     Pulse Rate 94     Resp (!) 23     Temp 98.5 F (36.9 C)     Temp Source Oral     SpO2 97 %     Weight 185 lb (83.9 kg)     Height 5\' 7"  (1.702 m)  Head Circumference      Peak Flow      Pain Score      Pain Loc      Pain Edu?      Excl. in [place]?     Constitutional: Alert and oriented. Well appearing and in no apparent distress. HEENT:      Head: Normocephalic and atraumatic.         Eyes: Conjunctivae are normal. Sclera is non-icteric.       Mouth/Throat: Mucous membranes are moist.       Neck: Supple with no signs of meningismus. Cardiovascular: Regular rate and rhythm. No murmurs, gallops, or rubs. 2+ symmetrical distal pulses are present in all extremities.  Respiratory: Patient is tachypneic with normal sats and clear lungs Gastrointestinal: Soft, non tender, and non distended with  positive bowel sounds. No rebound or guarding. Genitourinary: Soiled with watery brown diarrhea Musculoskeletal:  No edema, cyanosis, or erythema of extremities. Neurologic: Normal speech and language. Face is symmetric. Moving all extremities. No gross focal neurologic deficits are appreciated. Skin: Skin is warm, dry and intact. No rash noted. Psychiatric: Mood and affect are normal. Speech and behavior are normal.  ____________________________________________   LABS (all labs ordered are listed, but only abnormal results are displayed)  Labs Reviewed  CBC WITH DIFFERENTIAL/PLATELET - Abnormal; Notable for the following components:      Result Value   RBC 4.07 (*)    Hemoglobin 12.3 (*)    HCT 36.7 (*)    All other components within normal limits  COMPREHENSIVE METABOLIC PANEL - Abnormal; Notable for the following components:   Glucose, Bld 100 (*)    BUN 29 (*)    Creatinine, Ser 1.39 (*)    GFR, Estimated 49 (*)    All other components within normal limits  RESP PANEL BY RT-PCR (FLU A&B, COVID) [id number]  PROTIME-INR  URINALYSIS, COMPLETE (UACMP) WITH MICROSCOPIC  TROPONIN I (HIGH SENSITIVITY)   ____________________________________________  EKG  ED ECG REPORT I, Rudene Re, the attending physician, personally viewed and interpreted this ECG.  Normal sinus rhythm with a rate of 95, normal intervals, normal axis, no ST elevations or depressions. ____________________________________________  RADIOLOGY  I have personally reviewed the images performed during this visit and I agree with the Radiologist's read.  Imaging PND   ____________________________________________   PROCEDURES  Procedure(s) performed:yes .1-3 Lead EKG Interpretation Performed by: Rudene Re, MD Authorized by: Rudene Re, MD     Interpretation: non-specific     ECG rate assessment: normal     Rhythm: sinus rhythm     Ectopy: none     Conduction: normal     Critical  Care performed:  None ____________________________________________   INITIAL IMPRESSION / ASSESSMENT AND PLAN / ED COURSE   87 y.o. male with a history of A. fib on Coumadin, DVT, BPH, CKD, hypertension who presents for evaluation of generalized weakness and fall.  Patient is status post colonoscopy 48 hours ago.  Has increased work of breathing and is tachypneic with no hypoxia and clear lungs.  Looks euvolemic.  Patient looks pale, abdomen is soft and nontender.  EKG is nonischemic and with no signs of dysrhythmias.  He was placed on telemetry for monitoring of cardiorespiratory status.  History gathered from his wife and patient.  Ddx dehydration versus aspiration versus UTI versus DKA versus pneumonia versus COVID versus flu versus anemia  Labs pending, will get CT head due to the several falls, chest x-ray to rule out pneumonia.  UA to rule out UTI.  Will give IV fluids.   _________________________ 7:16 AM on 03/08/2021 ----------------------------------------- Labs showing stable kidney function, no significant electrolyte derangements, no leukocytosis, no significant anemia, normal troponin.  Patient on Coumadin therefore INR was added on and that is pending.  Respiratory panel, chest x-ray and urinalysis pending.  Care transferred to Dr. Joni Fears.     _____________________________________________ Please note:  Patient was evaluated in Emergency Department today for the symptoms described in the history of present illness. Patient was evaluated in the context of the global COVID-19 pandemic, which necessitated consideration that the patient might be at risk for infection with the SARS-CoV-2 virus that causes COVID-19. Institutional protocols and algorithms that pertain to the evaluation of patients at risk for COVID-19 are in a state of rapid change based on information released by regulatory bodies including the CDC and federal and state organizations. These policies and algorithms were  followed during the patient's care in the ED.  Some ED evaluations and interventions may be delayed as a result of limited staffing during the pandemic.   [place] Controlled Substance Database was reviewed by me. ____________________________________________   FINAL CLINICAL IMPRESSION(S) / ED DIAGNOSES   Final diagnoses:  Generalized weakness  Fall, initial encounter      NEW MEDICATIONS STARTED DURING THIS VISIT:  ED Discharge Orders    None       Note:  This document was prepared using Dragon voice recognition software and may include unintentional dictation errors.    Rudene Re, MD 03/08/21 [phone]

## 2021-03-08 NOTE — ED Notes (Signed)
Pt resting in the bed with wife at bedside

## 2021-03-08 NOTE — ED Notes (Signed)
US at bedside

## 2021-03-08 NOTE — ED Provider Notes (Signed)
Procedures     ----------------------------------------- 10:00 AM on 03/08/2021 -----------------------------------------  COVID test is positive.  CT scan shows a left frontal mass with vasogenic edema and some regional midline shift, concerning for malignancy.  Results discussed with the patient and his spouse at bedside.  They are both triple vaccinated for COVID.  Case discussed with neurosurgery Dr. Cari Caraway who recommends treatment for COVID at this time, medical optimization after which point he can follow-up in neurosurgery office for evaluation and brain mass management.  Recommends Decadron and usual COVID care.  Case discussed with hospitalist for further management.  Decadron and remdesivir ordered.  MRI ordered per radiology recommendation for further evaluation of brain mass.    Carrie Mew, MD 03/08/21 1036

## 2021-03-08 NOTE — H&P (Addendum)
History and Physical    Alexander Lane OFB:[id number] DOB: 22-Apr-1930 DOA: 03/08/2021  Referring MD/NP/PA:   PCP: Rusty Aus, MD   Patient coming from:  The patient is coming from home.       Chief Complaint: Generalized weakness, fall, cough, confusion  HPI: Alexander Lane is a 87 y.o. male with medical history significant of HTN, HLD, GERD, DVT and A. fib on Coumadin, CKD-3a, BPH, who presents with generalized weakness, fall, cough, confusion.  Per pt's wife at bedside, patient has generalized weakness in the past several days.  Patient fell twice, first fall on Saturday and second fall happened this early morning at about 3 AM. After the first fall, family was able to assist him up but the second time they were unable due to generalized weakness.  Patient also has dry cough, mild shortness of breath, no chest pain, fever or chills.  Patient does not have nausea, vomiting, diarrhea or abdominal pain currently, but wife states that patient had one loose stool bowel movement yesterday.  Patient did not complains of symptoms of UTI.  At his normal baseline, patient is alert and oriented x3.  Today patient is confused.  He knows his own name, an knows that he is in hospital, but confused about time. No facial droop or slurred speech.  Patient was vaccinated against COVID-19, received 3 doses of vaccine.  ED Course: pt was found to have positive COVID-19 PCR, WBC 6.1, INR 1.2, troponin level 14, stable renal function, temperature normal, blood pressure 260/100, 159/75, heart rate 94, RR 30, oxygen saturation 97% on room air.  Chest x-ray negative.  Patient is admitted to [facility] bed as inpatient.  Dr. Izora Ribas of neurosurgery is consulted.  CT of head: 1. Anterior left hemisphere mixed density mass measures at least 5 cm diameter with associated vasogenic edema, regional midline shift. Although a progressive left frontal Meningioma is possible, hypodensity and expansion of the  anterior corpus callosum is suspicious for an infiltrative malignancy. Recommend Brain MRI without and with contrast to further characterize. 2. No acute traumatic injury identified to the brain. 3. Mild anterior scalp hematoma. No skull fracture identified.    Review of Systems: Could not be reviewed accurately due to altered mental status.   Allergy:  Allergies  Allergen Reactions  . Celebrex [Celecoxib]     Past Medical History:  Diagnosis Date  . BPH (benign prostatic hyperplasia)   . Chronic kidney disease   . DVT (deep venous thrombosis) ([place]) 2014   right leg, IVC filter  . Dysrhythmia 2019   Paroxysmal atrial fibrillation  . Hypertension     Past Surgical History:  Procedure Laterality Date  . TONSILLECTOMY      Social History:  reports that he has quit smoking. He has never used smokeless tobacco. He reports that he does not drink alcohol and does not use drugs.  Family History:  Family History  Problem Relation Age of Onset  . Melanoma Brother      Prior to Admission medications   Medication Sig Start Date End Date Taking? Authorizing Provider  acetaminophen (TYLENOL) 500 MG tablet Take 500-1,000 mg by mouth every 6 (six) hours as needed for mild pain or moderate pain.   Yes [provider]  amLODipine (NORVASC) 2.5 MG tablet Take 2.5 mg by mouth at bedtime.    [provider]  loratadine (CLARITIN) 10 MG tablet Take 10 mg by mouth daily as needed for allergies.    [provider]  olmesartan (BENICAR) 20 MG tablet Take 10 mg by mouth daily.    [provider]  pantoprazole (PROTONIX) 40 MG tablet Take 40 mg by mouth daily.    [provider]  simvastatin (ZOCOR) 20 MG tablet Take 20 mg by mouth at bedtime.    [provider]  terazosin (HYTRIN) 5 MG capsule Take 5 mg by mouth at bedtime.    [provider]  warfarin (COUMADIN) 5 MG tablet Take 5 mg by mouth daily. Alternating with 1 and 1/2  tablets (7.5mg ) every other day    [provider]    Physical Exam: Vitals:   03/08/21 0600 03/08/21 0630 03/08/21 0700 03/08/21 0730  BP: (!) 173/74 (!) 206/67 (!) 170/71 (!) 159/75  Pulse: 89 91 81 87  Resp: (!) 23 (!) 26 20 18   Temp:      TempSrc:      SpO2: 97% 98% 96% 97%  Weight:      Height:       General: Not in acute distress HEENT:       Eyes: PERRL, EOMI, no scleral icterus.       ENT: No discharge from the ears and nose.       Neck: No JVD, no bruit, no mass felt. Heme: No neck lymph node enlargement. Cardiac: S1/S2, RRR, No murmurs, No gallops or rubs. Respiratory: No rales, wheezing, rhonchi or rubs. GI: Soft, nondistended, nontender, no organomegaly, BS present. GU: No hematuria Ext: has 1+ pitting leg edema bilaterally. The right lower leg is mildly erythematous.  1+DP/PT pulse bilaterally. Musculoskeletal: No joint deformities, No joint redness or warmth, no limitation of ROM in spin. Skin: No rashes.  Neuro: Knows his own name, an knows that he is in hospital, but confused about time. cranial nerves II-XII grossly intact, moves all extremities. Psych: Patient is not psychotic, no suicidal or hemocidal ideation.  Labs on Admission: I have personally reviewed following labs and imaging studies  CBC: Recent Labs  Lab 03/08/21 0535  WBC 6.1  NEUTROABS 4.5  HGB 12.3*  HCT 36.7*  MCV 90.2  PLT 384   Basic Metabolic Panel: Recent Labs  Lab 03/08/21 0535  NA 143  K 4.0  CL 108  CO2 26  GLUCOSE 100*  BUN 29*  CREATININE 1.39*  CALCIUM 9.2   GFR: Estimated Creatinine Clearance: 38 mL/min (A) (by C-G formula based on SCr of 1.39 mg/dL (H)). Liver Function Tests: Recent Labs  Lab 03/08/21 0535  AST 30  ALT 15  ALKPHOS 52  BILITOT 0.7  PROT 6.5  ALBUMIN 4.0   No results for input(s): LIPASE, AMYLASE in the last 168 hours. No results for input(s): AMMONIA in the last 168 hours. Coagulation Profile: Recent Labs  Lab 03/08/21 0535   INR 1.2   Cardiac Enzymes: Recent Labs  Lab 03/08/21 0535  CKTOTAL 742*   BNP (last 3 results) No results for input(s): PROBNP in the last 8760 hours. HbA1C: No results for input(s): HGBA1C in the last 72 hours. CBG: No results for input(s): GLUCAP in the last 168 hours. Lipid Profile: No results for input(s): CHOL, HDL, LDLCALC, TRIG, CHOLHDL, LDLDIRECT in the last 72 hours. Thyroid Function Tests: No results for input(s): TSH, T4TOTAL, FREET4, T3FREE, THYROIDAB in the last 72 hours. Anemia Panel: No results for input(s): VITAMINB12, FOLATE, FERRITIN, TIBC, IRON, RETICCTPCT in the last 72 hours. Urine analysis: No results found for: COLORURINE, APPEARANCEUR, [place], [place], [place], [place], [place], [place], [place], [place],  NITRITE, LEUKOCYTESUR Sepsis Labs: @LABRCNTIP (procalcitonin:4,lacticidven:4) ) Recent Results (from the past 240 hour(s))  Resp Panel by RT-PCR (Flu A&B, Covid) Nasopharyngeal Swab     Status: Abnormal   Collection Time: 03/08/21  5:26 AM   Specimen: Nasopharyngeal Swab; Nasopharyngeal(NP) swabs in vial transport medium  Result Value Ref Range Status   SARS Coronavirus 2 by RT PCR POSITIVE (A) NEGATIVE Final    Comment: RESULT CALLED TO, READ BACK BY AND VERIFIED WITH: ARIEL SMITH 03/08/21 0837 [facility] (NOTE) SARS-CoV-2 target nucleic acids are DETECTED.  The SARS-CoV-2 RNA is generally detectable in upper respiratory specimens during the acute phase of infection. Positive results are indicative of the presence of the identified virus, but do not rule out bacterial infection or co-infection with other pathogens not detected by the test. Clinical correlation with patient history and other diagnostic information is necessary to determine patient infection status. The expected result is Negative.  Fact Sheet for Patients: EntrepreneurPulse.com.au  Fact Sheet for Healthcare  Providers: IncredibleEmployment.be  This test is not yet approved or cleared by the [place] FDA and  has been authorized for detection and/or diagnosis of SARS-CoV-2 by FDA under an Emergency Use Authorization (EUA).  This EUA will remain in effect (meaning this test can be use d) for the duration of  the COVID-19 declaration under Section 564(b)(1) of the Act, 21 U.S.C. section 360bbb-3(b)(1), unless the authorization is terminated or revoked sooner.     Influenza A by PCR NEGATIVE NEGATIVE Final   Influenza B by PCR NEGATIVE NEGATIVE Final    Comment: (NOTE) The Xpert Xpress SARS-CoV-2/FLU/RSV plus assay is intended as an aid in the diagnosis of influenza from Nasopharyngeal swab specimens and should not be used as a sole basis for treatment. Nasal washings and aspirates are unacceptable for Xpert Xpress SARS-CoV-2/FLU/RSV testing.  Fact Sheet for Patients: EntrepreneurPulse.com.au  Fact Sheet for Healthcare Providers: IncredibleEmployment.be  This test is not yet approved or cleared by the [place] FDA and has been authorized for detection and/or diagnosis of SARS-CoV-2 by FDA under an Emergency Use Authorization (EUA). This EUA will remain in effect (meaning this test can be used) for the duration of the COVID-19 declaration under Section 564(b)(1) of the Act, 21 U.S.C. section 360bbb-3(b)(1), unless the authorization is terminated or revoked.  Performed at [facility], [address], [place], [place] [address]      Radiological Exams on Admission: DG Chest 2 View  Result Date: 03/08/2021 CLINICAL DATA:  87 year old male positive COVID-19.  Weakness. EXAM: CHEST - 2 VIEW COMPARISON:  None. FINDINGS: Seated AP and lateral views of the chest. Borderline to mild cardiomegaly. Other mediastinal contours are within normal limits. Visualized tracheal air column is within normal limits. Lung volumes  and ventilation within normal limits. No pneumothorax, pleural effusion, pulmonary edema or confluent pulmonary opacity. Probable widespread thoracic spine ankylosis due to flowing endplate osteophytes. Osteopenia. No acute osseous abnormality identified. Negative visible bowel gas. IMPRESSION: No acute cardiopulmonary abnormality. Electronically Signed   By: Genevie Ann M.D.   On: 03/08/2021 08:27     EKG: I have personally reviewed.  Sinus rhythm, QTC 447, low voltage, early R wave progression, nonspecific T wave change  Assessment/Plan Principal Problem:   COVID-19 virus infection Active Problems:   BPH (benign prostatic hyperplasia)   CKD (chronic kidney disease), stage IIIa   DVT (deep venous thrombosis) (HCC)   Hypertension   PAF (paroxysmal atrial fibrillation) (HCC)   Brain mass   Acute metabolic encephalopathy  Hypertensive urgency   Fall at home, initial encounter   GERD (gastroesophageal reflux disease)   HLD (hyperlipidemia)   Sepsis (HCC)   Elevated CK  Sepsis due to COVID-19 virus infection: Patient has dry cough, mild shortness of breath, tachypnea with RR up to 30s.  No oxygen desaturation.  No infiltration on chest x-ray.  Patient initially meets criteria for sepsis with tachycardia with heart rate up to 94, tachypnea with RR 30.  Pending lactic acid level.  Currently hemodynamically stable.  -will admit to med-surg bed as inpt -Remdesivir per pharm -Bronchodilators -PRN Mucinex for cough -Gentle IV fluid: 1L of LR was given, then 75 cc/h due to elevated CK 742 --> will give more IVF bolus if lactic acid is elevated -Get procalcitonin and trend lactic acid level -f/u inflammation markers - if pt develop oxygen desaturation, will ask the patient to maintain an awake prone position for 16+ hours a day, if possible, with a minimum of 2-3 hours at a time   BPH (benign prostatic hyperplasia) -Terazosin  CKD (chronic kidney disease), stage IIIa: Stable -Follow-up with  the BMP  DVT (deep venous thrombosis) (HCC): on Coumadin, but INR is subtherapeutic at 1.2 -coumadin per pharm -Repeat lower extremity Doppler  PAF (paroxysmal atrial fibrillation): HR 94 -->87 -on coumadin  Hypertension and hypertensive urgency: Blood pressure 260/100, 159/75 -IV hydralazine as needed -Amlodipine, irbesartan  Acute metabolic encephalopathy: Likely multifactorial etiology, including COVID infection) mass. -Frequent neurochecks -Follow-up urinalysis  Fall at home, initial encounter and generalized weakness -PT/OT  GERD (gastroesophageal reflux disease) -Protonix  HLD (hyperlipidemia) -Zocor  Brain mass: CT-head showed anterior left hemisphere mixed density mass measures at least 5 cm diameter with associated vasogenic edema, regional midline shift. Although a progressive left frontal Meningioma is possible, hypodensity and expansion of the anterior corpus callosum is suspicious for an infiltrative malignancy.  EDP consulted discussed with neurosurgery Dr. Marcell Barlow. He recommends "treatment for COVID at this time, medical optimization after which point he can follow-up in neurosurgery office for evaluation and brain mass management.  Recommends Decadron and usual COVID care".  -will get brain MRI without and with contrast -Decadrone, 10 mg in ED --> then 4 mg q6h -Palliative care consult  Addendum: Dr. Myer Haff sent me a security chat message, recommended to get MRI w/ and w/o contrast when patient is stable, fine to start Keppra 500 mg bid. He will see pt when MRI is done. -will start Kappra 500 mg bid -f/u MRI-brain w/ and w/o constrast. -Seizure precaution -When necessary Ativan for seizure   Elevated CK: CK 742 -1L of LR was given, then 75 cc/h -repeat CK in AM   DVT ppx: on Coumadin Code Status: Full code per her daughter who is the power of attorney Family Communication:  Yes, patient's wife   at bed side.  I also talked to her daughter on the  phone Disposition Plan:  Anticipate discharge back to previous environment Consults called: neurosurgeon, Dr. Myer Haff Admission status and Level of care: Med-Surg:    as inpt       Status is: Inpatient  Remains inpatient appropriate because:Inpatient level of care appropriate due to severity of illness   Dispo: The patient is from: Home              Anticipated d/c is to: Home              Patient currently is not medically stable to d/c.   Difficult to place patient No  Date of Service 03/08/2021    Ivor Costa [facility]   If 7PM-7AM, please contact night-coverage [email or web address] 03/08/2021, 11:56 AM

## 2021-03-08 NOTE — ED Notes (Signed)
Pt taken for scans 

## 2021-03-08 NOTE — ED Triage Notes (Signed)
Patient to rm 26 from home via EMS for generalized weakness.  EMS reports that patient has had 2 falls tonight and that with the 2nd fall he and wife were unable to get him back up and into bed.  Wife reports she heard the patient shuffling around at 2am then realized he had not gotten back into bed and went to check on him and found him in the floor.

## 2021-03-09 ENCOUNTER — Encounter: Payer: Self-pay | Admitting: Gastroenterology

## 2021-03-09 DIAGNOSIS — U071 COVID-19: Secondary | ICD-10-CM | POA: Diagnosis not present

## 2021-03-09 DIAGNOSIS — W19XXXA Unspecified fall, initial encounter: Secondary | ICD-10-CM | POA: Diagnosis not present

## 2021-03-09 DIAGNOSIS — Z7189 Other specified counseling: Secondary | ICD-10-CM

## 2021-03-09 DIAGNOSIS — Z515 Encounter for palliative care: Secondary | ICD-10-CM

## 2021-03-09 DIAGNOSIS — G9389 Other specified disorders of brain: Secondary | ICD-10-CM | POA: Diagnosis not present

## 2021-03-09 DIAGNOSIS — G9341 Metabolic encephalopathy: Secondary | ICD-10-CM | POA: Diagnosis not present

## 2021-03-09 LAB — COMPREHENSIVE METABOLIC PANEL
ALT: 16 U/L (ref 0–44)
AST: 34 U/L (ref 15–41)
Albumin: 3.4 g/dL — ABNORMAL LOW (ref 3.5–5.0)
Alkaline Phosphatase: 46 U/L (ref 38–126)
Anion gap: 9 (ref 5–15)
BUN: 27 mg/dL — ABNORMAL HIGH (ref 8–23)
CO2: 26 mmol/L (ref 22–32)
Calcium: 8.6 mg/dL — ABNORMAL LOW (ref 8.9–10.3)
Chloride: 107 mmol/L (ref 98–111)
Creatinine, Ser: 1.04 mg/dL (ref 0.61–1.24)
GFR, Estimated: >60 mL/min (ref >60)
Glucose, Bld: 122 mg/dL — ABNORMAL HIGH (ref 70–99)
Potassium: 4.1 mmol/L (ref 3.5–5.1)
Sodium: 142 mmol/L (ref 135–145)
Total Bilirubin: 0.7 mg/dL (ref 0.3–1.2)
Total Protein: 5.8 g/dL — ABNORMAL LOW (ref 6.5–8.1)

## 2021-03-09 LAB — D-DIMER, QUANTITATIVE: D-Dimer, Quant: 1.3 ug/mL-FEU — ABNORMAL HIGH (ref 0.00–0.50)

## 2021-03-09 LAB — FERRITIN: Ferritin: 110 ng/mL (ref 24–336)

## 2021-03-09 LAB — CBC WITH DIFFERENTIAL/PLATELET
Abs Immature Granulocytes: 0.01 10*3/uL (ref 0.00–0.07)
Basophils Absolute: 0 10*3/uL (ref 0.0–0.1)
Basophils Relative: 0 %
Eosinophils Absolute: 0 10*3/uL (ref 0.0–0.5)
Eosinophils Relative: 0 %
HCT: 36.3 % — ABNORMAL LOW (ref 39.0–52.0)
Hemoglobin: 12.2 g/dL — ABNORMAL LOW (ref 13.0–17.0)
Immature Granulocytes: 0 %
Lymphocytes Relative: 15 %
Lymphs Abs: 0.8 10*3/uL (ref 0.7–4.0)
MCH: 29.8 pg (ref 26.0–34.0)
MCHC: 33.6 g/dL (ref 30.0–36.0)
MCV: 88.5 fL (ref 80.0–100.0)
Monocytes Absolute: 0.2 10*3/uL (ref 0.1–1.0)
Monocytes Relative: 4 %
Neutro Abs: 4.3 10*3/uL (ref 1.7–7.7)
Neutrophils Relative %: 81 %
Platelets: 184 10*3/uL (ref 150–400)
RBC: 4.1 MIL/uL — ABNORMAL LOW (ref 4.22–5.81)
RDW: 12.6 % (ref 11.5–15.5)
WBC: 5.4 10*3/uL (ref 4.0–10.5)
nRBC: 0 % (ref 0.0–0.2)

## 2021-03-09 LAB — C-REACTIVE PROTEIN: CRP: 6.2 mg/dL — ABNORMAL HIGH (ref <1.0)

## 2021-03-09 LAB — PROTIME-INR
INR: 1.3 — ABNORMAL HIGH (ref 0.8–1.2)
Prothrombin Time: 16.4 seconds — ABNORMAL HIGH (ref 11.4–15.2)

## 2021-03-09 LAB — CK: Total CK: 589 U/L — ABNORMAL HIGH (ref 49–397)

## 2021-03-09 MED ORDER — WARFARIN SODIUM 7.5 MG PO TABS
7.5000 mg | ORAL_TABLET | Freq: Once | ORAL | Status: AC
  Administered 2021-03-09: 7.5 mg via ORAL
  Filled 2021-03-09 (×2): qty 1

## 2021-03-09 MED ORDER — ACETAMINOPHEN 325 MG PO TABS
650.0000 mg | ORAL_TABLET | Freq: Four times a day (QID) | ORAL | Status: DC | PRN
  Administered 2021-03-14 – 2021-03-15 (×4): 650 mg via ORAL
  Filled 2021-03-09: qty 2

## 2021-03-09 NOTE — Evaluation (Signed)
Physical Therapy Evaluation Patient Details Name: Alexander Lane MRN: [id number] DOB: 1930/06/22 Today's Date: 03/09/2021   History of Present Illness  Pt is admitted for covid with complaints of weakness and falls. HIstory includes HTN, HLD, GERD, falls and cough. Per chart, multiple falls at home prior to admission.  Clinical Impression  Pt is a pleasant 87 year old male who was admitted for covid. Pt performs bed mobility, transfers, and ambulation with mod assist and HHA. Pt demonstrates deficits with unsteadiness/endurance/strength/cognition. Doesn't appear to be at baseline level. Would benefit from skilled PT to address above deficits and promote optimal return to PLOF; recommend transition to STR upon discharge from acute hospitalization.     Follow Up Recommendations SNF    Equipment Recommendations  Rolling walker with 5" wheels    Recommendations for Other Services       Precautions / Restrictions Precautions Precautions: Fall Restrictions Weight Bearing Restrictions: No      Mobility  Bed Mobility Overal bed mobility: Needs Assistance Bed Mobility: Supine to Sit     Supine to sit: Mod assist     General bed mobility comments: needs assist for sliding B LE off bed and trunkal assistance. Once seated at EOB, upright posture attained and able to sit with supervision.    Transfers Overall transfer level: Needs assistance Equipment used: 1 person hand held assist Transfers: Sit to/from Stand Sit to Stand: Mod assist         General transfer comment: unsteady and reaches out for furniture. Forward flexed posture. No AD available, however would benefit with further transfers  Ambulation/Gait Ambulation/Gait assistance: Mod assist Gait Distance (Feet): 2 Feet Assistive device: 1 person hand held assist Gait Pattern/deviations: Step-to pattern     General Gait Details: ambulated with unsteadiness over to recliner. Reaching out for furniture and has  difficulty with weight shifting for stepping. Not safe for further ambulation trial at this time  Stairs            Wheelchair Mobility    Modified Rankin (Stroke Patients Only)       Balance Overall balance assessment: Needs assistance;History of Falls Sitting-balance support: Bilateral upper extremity supported Sitting balance-Leahy Scale: Fair     Standing balance support: Single extremity supported Standing balance-Leahy Scale: Poor                               Pertinent Vitals/Pain Pain Assessment: No/denies pain    Home Living Family/patient expects to be discharged to:: Private residence Living Arrangements: Spouse/significant other Available Help at Discharge: Family Type of Home: House Home Access: Stairs to enter Entrance Stairs-Rails: Right Entrance Stairs-Number of Steps: 7 Home Layout: Two level;Bed/bath upstairs Home Equipment: Walker - 4 wheels;Cane - quad;Cane - single point      Prior Function Level of Independence: Independent with assistive device(s)         Comments: primarily uses SPC for ambulation     Hand Dominance        Extremity/Trunk Assessment   Upper Extremity Assessment Upper Extremity Assessment: Overall WFL for tasks assessed    Lower Extremity Assessment Lower Extremity Assessment: Generalized weakness (R knee flexion contracture; B LE grossly 3+/5)       Communication   Communication: HOH  Cognition Arousal/Alertness: Awake/alert Behavior During Therapy: WFL for tasks assessed/performed Overall Cognitive Status: Impaired/Different from baseline  General Comments: appears confused and is poor historian. Alert to name/place. Not oriented to year and situation.      General Comments      Exercises Other Exercises Other Exercises: seated ther-ex performed on B LE including SLRs, hip abd/add, and AP. 10 reps performed with min assist. Other  Exercises: Pt set up with breakfast tray, however has difficulty taking tops off, opening milk and then begins pouring milk on pancakes. Was able to cut food into bite size pieces.   Assessment/Plan    PT Assessment Patient needs continued PT services  PT Problem List Decreased strength;Decreased activity tolerance;Decreased balance;Decreased mobility;Decreased knowledge of use of DME;Decreased safety awareness       PT Treatment Interventions Gait training;DME instruction;Therapeutic exercise;Balance training    PT Goals (Current goals can be found in the Care Plan section)  Acute Rehab PT Goals Patient Stated Goal: to go home PT Goal Formulation: With patient Time For Goal Achievement: 03/23/21 Potential to Achieve Goals: Good    Frequency Min 2X/week   Barriers to discharge        Co-evaluation               AM-PAC PT "6 Clicks" Mobility  Outcome Measure Help needed turning from your back to your side while in a flat bed without using bedrails?: A Little Help needed moving from lying on your back to sitting on the side of a flat bed without using bedrails?: A Little Help needed moving to and from a bed to a chair (including a wheelchair)?: A Lot Help needed standing up from a chair using your arms (e.g., wheelchair or bedside chair)?: A Lot Help needed to walk in hospital room?: Total Help needed climbing 3-5 steps with a railing? : Total 6 Click Score: 12    End of Session Equipment Utilized During Treatment: Gait belt Activity Tolerance: Patient tolerated treatment well Patient left: in chair;with chair alarm set Nurse Communication: Mobility status PT Visit Diagnosis: Unsteadiness on feet (R26.81);Muscle weakness (generalized) (M62.81);Difficulty in walking, not elsewhere classified (R26.2)    Time: 7829-5621 PT Time Calculation (min) (ACUTE ONLY): 26 min   Charges:   PT Evaluation $PT Eval Low Complexity: 1 Low PT Treatments $Therapeutic Exercise: 8-22  mins        Greggory Stallion, PT, DPT [phone]   Carlynn Leduc 03/09/2021, 1:28 PM

## 2021-03-09 NOTE — Progress Notes (Signed)
PROGRESS NOTE  [name]  DOB: 1930/01/04  PCP: Rusty Aus, MD TDD:[id number]  DOA: 03/08/2021  LOS: 1 day  Hospital Day: 2   Chief Complaint  Patient presents with  . Weakness  . Fall    Brief narrative: Alexander Lane is a 87 y.o. adult with PMH significant for HTN, HLD,  DVT, A. fib on Coumadin, CKD 3A, GERD, BPH. Patient presented to the ED on 5/22 with complaint of generalized weakness, fall, cough, confusion, progressively worsening for the last several days. Patient had received all 3 doses of vaccine against COVID-19.  In the ED, blood pressure mostly elevated up to 206/67, heart rate 94 COVID PCR positive. Chest x-ray negative. CT head showed an anterior left hemisphere mixed density mass measuring at least 5 cm diameter with associated vasogenic edema, regional midline shift. Although a progressive left frontal Meningioma is possible, hypodensity and expansion of the anterior corpus callosum is suspicious for an infiltrative malignancy. MRI brain was obtained which showed a large enhancing mass in the left frontal lobe, extra-axial and consistent with meningioma with extensive mass-effect and edema in the left frontal lobe with 11 mm subfalcine herniation. Admitted to hospitalist service. Neurosurgery consulted.  Subjective: Patient was seen and examined this morning.  Pleasant elderly Caucasian male.  Sitting up in chair.  Not in distress.  Feels weak.  Just worked with physical therapy.  Not on supplemental oxygen.  Wants to go home. Chart reviewed. No fever, heart rate in 80s, blood pressure in 150s mostly CK level slightly elevated 589, INR subtherapeutic at 1.3  Assessment/Plan: COVID pneumonia Acute respiratory failure with hypoxia  -Presented with cough, shortness of breath, tachypnea. -COVID test: PCR positive on admission -Chest imaging: Chest x-ray negative for infiltrates. -Treatment: Started on IV remdesivir and dexamethasone.  Currently  breathing on room air. -Progression: Not on supplemental oxygen but still has significant weakness. -Supportive care: Vitamin C, Zinc, PRN inhalers, Tylenol, Antitussives (benzonatate/ Mucinex/Tussionex).   -Encouraged incentive spirometry, prone position, out of bed and early mobilization as much as possible -WBC and inflammatory markers trend as below. Recent Labs  Lab 03/08/21 0526 03/08/21 0535 03/08/21 0631 03/08/21 1332 03/08/21 1656 03/09/21 0425  SARSCOV2NAA POSITIVE*  --   --   --   --   --   WBC  --  6.1  --   --   --  5.4  LATICACIDVEN  --   --   --  1.0 0.8  --   PROCALCITON  --  <0.10  --   --   --   --   DDIMER  --   --  0.99*  --   --  1.30*  FERRITIN  --   --  111  --   --  110  CRP  --   --   --  4.9*  --   --   ALT  --  15  --   --   --  16   SIRS - POA -Patient met SIRS criteria on admission because of COVID infection.  No evidence of bacterial infection.  Procalcitonin negative.  Lactic acid level normal.  WC count normal.  Sepsis ruled out.  Hypertensive urgency -Systolic blood pressure on admission was over 200.   -On amlodipine and irbesartan at home.  Acute metabolic encephalopathy:  -Likely multifactorial etiology, including COVID infection, dehydration, elevated blood pressure, intracranial mass. -Frequent neurochecks -Continue to monitor mental status change  Intracranial mass  -CT-head and MRI brain  finding as above showing a large meningioma.   -On admission, patient was started on steroids and Keppra.   -Neurology consultation obtained with Dr. Izora Ribas.  No need of inpatient intervention.  Recommended to stop Keppra and wean off steroids in 5 to 7 days.  He will arrange for outpatient follow-up in couple of weeks.   Paroxysmal A. Fib Subtherapeutic INR -On Coumadin. -Pharmacy to monitor INR Recent Labs  Lab 03/08/21 0535 03/09/21 0425  INR 1.2 1.3*   AKI on CKD 3A -Creatinine was elevated to 1.39 on admission.  Improved this morning.   Continue to monitor. Recent Labs    03/08/21 0535 03/09/21 0425  BUN 29* 27*  CREATININE 1.39* 1.04   BPH (benign prostatic hyperplasia) -Continue terazosin  DVT (deep venous thrombosis) -Continue Coumadin.  DVT scan of lower extremities negative.    generalized weakness/fall -Pending PT/OT eval  GERD (gastroesophageal reflux disease) -Protonix  HLD (hyperlipidemia) -Zocor  Mobility: PT eval appreciated. Code Status:   Code Status: Full Code  Nutritional status: Body mass index is 28.98 kg/m.     Diet Order            Diet Heart Room service appropriate? Yes; Fluid consistency: Thin  Diet effective now                 DVT prophylaxis: warfarin   Antimicrobials:  None Fluid: None Consultants: Neurosurgery Family Communication:  Called and updated patient's wife Mrs. Debono this morning.  Status is: Inpatient  Remains inpatient appropriate because: COVID treatment  Dispo: The patient is from: Home              Anticipated d/c is to: Depends on PT recommendation              Patient currently is not medically stable to d/c.   Difficult to place patient No     Infusions:  . levETIRAcetam 500 mg (03/08/21 2213)  . remdesivir 100 mg in NS 100 mL      Scheduled Meds: . amLODipine  2.5 mg Oral Daily  . dexamethasone  4 mg Oral Q6H  . ipratropium  2 puff Inhalation Q4H  . irbesartan  150 mg Oral Daily  . pantoprazole  40 mg Oral BID  . simvastatin  20 mg Oral q1800  . terazosin  5 mg Oral QHS  . Warfarin - Pharmacist Dosing Inpatient   Does not apply q1600    Antimicrobials: Anti-infectives (From admission, onward)   Start     Dose/Rate Route Frequency Ordered Stop   03/09/21 1000  remdesivir 100 mg in sodium chloride 0.9 % 100 mL IVPB       "Followed by" Linked Group Details   100 mg 200 mL/hr over 30 Minutes Intravenous Daily 03/08/21 0935 03/13/21 0959   03/08/21 1200  remdesivir 200 mg in sodium chloride 0.9% 250 mL IVPB       "Followed  by" Linked Group Details   200 mg 580 mL/hr over 30 Minutes Intravenous Once 03/08/21 0935 03/08/21 1548      PRN meds: acetaminophen, acetaminophen, albuterol, dextromethorphan-guaiFENesin, hydrALAZINE, LORazepam   Objective: Vitals:   03/09/21 0452 03/09/21 0812  BP: (!) 154/76 (!) 160/85  Pulse: 77 84  Resp: 20 18  Temp: (!) 97.5 F (36.4 C) (!) 97.5 F (36.4 C)  SpO2: 95% 95%    Intake/Output Summary (Last 24 hours) at 03/09/2021 0904 Last data filed at 03/09/2021 0452 Gross per 24 hour  Intake 350 ml  Output  950 ml  Net -600 ml   Filed Weights   03/08/21 0529  Weight: 83.9 kg   Weight change:  Body mass index is 28.98 kg/m.   Physical Exam: General exam: Pleasant, elderly Caucasian male.  Sitting up in chair.  Not in distress Skin: No rashes, lesions or ulcers. HEENT: Atraumatic, normocephalic, no obvious bleeding Lungs: Clear on auscultation bilaterally CVS: Regular rate and rhythm, no murmur GI/Abd soft, nontender, nondistended, bowel sound present CNS: Alert, awake, oriented x3, slow to respond Psychiatry: Mood appropriate Extremities: No pedal edema, no calf tenderness  Data Review: I have personally reviewed the laboratory data and studies available.  Recent Labs  Lab 03/08/21 0535 03/09/21 0425  WBC 6.1 5.4  NEUTROABS 4.5 4.3  HGB 12.3* 12.2*  HCT 36.7* 36.3*  MCV 90.2 88.5  PLT 187 184   Recent Labs  Lab 03/08/21 0535 03/09/21 0425  NA 143 142  K 4.0 4.1  CL 108 107  CO2 26 26  GLUCOSE 100* 122*  BUN 29* 27*  CREATININE 1.39* 1.04  CALCIUM 9.2 8.6*    F/u labs ordered. Unresulted Labs (From admission, onward)          Start     Ordered   03/09/21 0500  CBC with Differential/Platelet  Daily,   STAT      03/08/21 1130   03/09/21 0500  Comprehensive metabolic panel  Daily,   STAT      03/08/21 1130   03/09/21 0500  C-reactive protein  Tomorrow morning,   R        03/08/21 1130   03/09/21 0500  Protime-INR  Daily,   R       03/08/21 1311   03/08/21 1131  Culture, sputum-assessment  Once,   STAT        03/08/21 1130   03/08/21 0526  Urinalysis, Complete w Microscopic  ONCE - STAT,   STAT        03/08/21 0525          Signed, Terrilee Croak, MD [facility] 03/09/2021

## 2021-03-09 NOTE — Consult Note (Signed)
[place] for Warfarin Indication: History of DVT / Afib  Patient Measurements: Height: 5\' 7"  (170.2 cm) Weight: 83.9 kg (185 lb) IBW/kg (Calculated) : 66.1  Labs: Recent Labs    03/08/21 0535 03/09/21 0425  HGB 12.3* 12.2*  HCT 36.7* 36.3*  PLT 187 184  LABPROT 14.7 16.4*  INR 1.2 1.3*  CREATININE 1.39* 1.04  CKTOTAL 742* 589*  TROPONINIHS 14  --     Estimated Creatinine Clearance (by C-G formula based on SCr of 1.04 mg/dL) Male: 41.6 mL/min Male: 50.8 mL/min   Medical History: Past Medical History:  Diagnosis Date  . BPH (benign prostatic hyperplasia)   . Chronic kidney disease   . DVT (deep venous thrombosis) ([place]) 2014   right leg, IVC filter  . Dysrhythmia 2019   Paroxysmal atrial fibrillation  . Hypertension     Medications:  Warfarin 5 mg daily prior to admission per med rec  Per [facility] records: warfarin 5 mg alternating with 7.5 mg every other day  Last patient reported dose 5/21 at 1800  Assessment: Patient is an 88 y/o M with medical history as above including Afib / DVT s/p IVC filter on warfarin who presented with generalized weakness / falls. Subsequently found to be positive for COVID-19 infection. 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   Pharmacy has been consulted for management of warfarin while patient is in-house.   Baseline CBC with Hgb 12.3, Plt 187 Albumin: 4, other LFTs WNL DDIs: Dexamethasone  Date INR Plan  5/22 1.2 warfarin 7.5 mg  5/23 1.3 Warfarin 7.5mg     Goal of Therapy:  INR 2-3   Plan:  --Suspect INR low given patient was holding warfarin for EGD on 03/06/21 and likely just resumed within last 1-2 days --Will give warfarin 7.5 mg tonight again tonight with plan to resume alternating dosing once INR therapeutic --Daily INR per protocol --CBC at least every 3 days per protocol  Lu Duffel, PharmD,  BCPS Clinical Pharmacist 03/09/2021 7:33 AM

## 2021-03-09 NOTE — Consult Note (Signed)
Consultation Note Date: 03/09/2021   Patient Name: Alexander Lane  DOB: 1930/06/20  MRN: [id number]  Age / Sex: 87 y.o., adult   PCP: Rusty Aus, MD Referring Physician: Terrilee Croak, MD   REASON FOR CONSULTATION:Establishing goals of care  Palliative Care consult requested for goals of care discussion in this 87 y.o. adult with a medical history significant for lipidemia, GERD, hypertension, atrial fibrillation (Coumadin), BPH, CKD stage III, and DVT.  Patient presented from home via EMS with complaints of generalized weakness.  Wife reports patient fell twice within 24 hours.  COVID 19 PCR positive.  Chest x-ray negative.  CT of head showed anterior left hemisphere density mass measuring 5 cm with associated vasogenic edema, regional midline shift.  Possible left frontal meningioma, hypodensity and expansion of the anterior corpus callosum is suspicious for an infiltrative malignancy.  Neurosurgery has been consulted recommendations for this medical management (steroids) with plans for outpatient follow-up.  Clinical Assessment and Goals of Care: I have reviewed medical records including lab results, imaging, Epic notes, and MAR, received report from the bedside RN, and assessed the patient.   Patient is working with physical therapy.  Much more awake alert and oriented today.  I spoke with patient's wife via phone to discuss diagnosis prognosis, Blue, EOL wishes, disposition and options.  I introduced Palliative Medicine as specialized medical care for people living with serious illness. It focuses on providing relief from the symptoms and stress of a serious illness. The goal is to improve quality of life for both the patient and the family.  We discussed a brief life review of the patient, along with his functional and nutritional status.  Patient and wife have been married for more than 60 years.  They have 2 children, 4 grandchildren, and 5 great-grandchildren who they love  dearly and are very actively involved in their lives.  Wife shares their son-in-law who recently passed away and was generalized on last Tuesday.  Emotional support provided.  Patient is a retired Engineer, manufacturing systems.  Prior to admission and current illness wife reports patient was ambulatory with a cane.  He was able to perform all ADLs independently.  He has not driven in some time due to his low reflexes.  Appetite has always been good with no concerns.  Several days prior to admission wife shares noticing patient becoming more weak.  He had a fall on the Saturday prior to admission and EMS came to assist him up.  He used a walker for the remainder of the day however early Sunday morning wife found patient on the floor again with increased weakness at that time warranting hospital admission.  He states patient seems somewhat disoriented which is not his baseline as he has not had any problems with memory prior to this incident.  We discussed His current illness and what it means in the larger context of His on-going co-morbidities. Natural disease trajectory and expectations at EOL were discussed.  Wife is clear in her understanding of patient's current illness and ongoing comorbidities.  She recently received updates from attending and verbalizes appreciation of continued support and communication.  We discussed newly found brain mass at length.  Wife states she and her children have been in constant discussion regarding next steps.  She states they are unsure if they would seek any aggressive work-up and are for certain they would not pursue any surgical interventions.   A detailed discussion was had today regarding advanced directives.  Concepts specific to code status, artifical feeding and hydration, continued IV antibiotics and rehospitalization.  Mrs. Zucker states patient does have an advanced directive in place.  Their daughter is his Education officer, community.  Wife states they chose to name their  daughter as their medical decision-maker given their elderly age and uncertainty if either of them would pass away.  We discussed at length patient's current full CODE STATUS with consideration of his current illness, newly found mass, and ongoing comorbidities.  Wife verbalized patient has always stated he wanted all aggressive medical interventions including heroic measures however she plans to further discuss with him as she feels if ever needed life-sustaining measures he would not do well and would not want her or their children to have to make such a decision.  We discussed DNR/DNI and what this would look like for patient.  Wife verbalized her understanding and her agreement with DNR.  She states she plans to further discuss with patient later today or tomorrow and would like to revisit his wishes at that time.  The difference between a aggressive medical intervention and a palliative comfort care path were discussed at length. Values and goals of care important to patient and family were attempted to be elicited.   Patient and wife are clear and expressed goals to continue to treat the treatable, no surgical interventions, with a goal of eventually returning home.  Patient states his wishes are to return home after hospitalization however wife is concerned with his deconditioning and weakness.  She states she does not feel it is safe for him to return home in his current state and is very much open and considering rehabilitation to allow him every opportunity to show some improvement prior to returning home.  She plans to continue ongoing discussions with her husband with hopes that he will be in agreement as she does not feel comfortable with him coming home at this time but does remain hopeful he will return home soon.   I discussed the importance of continued conversation with family and their medical providers regarding overall plan of care and treatment options, ensuring decisions are within the  context of the patients values and GOCs.  Questions and concerns were addressed. The family was encouraged to call with questions or concerns.  PMT will continue to support holistically as needed.   CODE STATUS: Full code  ADVANCE DIRECTIVES: Primary Decision Maker: Patient, wife, daughter   SYMPTOM MANAGEMENT: Per attending  Palliative Prophylaxis:   Aspiration, Delirium Protocol and Frequent Pain Assessment  PSYCHO-SOCIAL/SPIRITUAL:  Support System: Family  Desire for further Chaplaincy support: No  Additional Recommendations (Limitations, Scope, Preferences):  No Surgical Procedures and Continue to treat the treatable  Education on hospice/palliative    PAST MEDICAL HISTORY: Past Medical History:  Diagnosis Date  . BPH (benign prostatic hyperplasia)   . Chronic kidney disease   . DVT (deep venous thrombosis) ([place]) 2014   right leg, IVC filter  . Dysrhythmia 2019   Paroxysmal atrial fibrillation  . Hypertension     ALLERGIES:  is allergic to celebrex [celecoxib].   MEDICATIONS:  Current Facility-Administered Medications  Medication Dose Route Frequency Provider Last Rate Last Admin  . acetaminophen (TYLENOL) suppository 650 mg  650 mg Rectal Q6H PRN Ivor Costa, MD      . acetaminophen (TYLENOL) tablet 650 mg  650 mg Oral Q6H PRN Shanlever, Pierce Crane, RPH      . albuterol (VENTOLIN HFA) 108 (90 Base) MCG/ACT inhaler 2 puff  2  puff Inhalation Q4H PRN Ivor Costa, MD      . amLODipine (NORVASC) tablet 2.5 mg  2.5 mg Oral Daily Ivor Costa, MD   2.5 mg at 03/09/21 1036  . dexamethasone (DECADRON) tablet 4 mg  4 mg Oral Q6H Ivor Costa, MD   4 mg at 03/09/21 [phone]  . dextromethorphan-guaiFENesin (MUCINEX DM) 30-600 MG per 12 hr tablet 1 tablet  1 tablet Oral BID PRN Ivor Costa, MD      . hydrALAZINE (APRESOLINE) injection 5 mg  5 mg Intravenous Q2H PRN Ivor Costa, MD      . ipratropium (ATROVENT HFA) inhaler 2 puff  2 puff Inhalation Q4H Ivor Costa, MD   2 puff at  03/09/21 0850  . irbesartan (AVAPRO) tablet 150 mg  150 mg Oral Daily Ivor Costa, MD   150 mg at 03/09/21 1035  . LORazepam (ATIVAN) injection 1 mg  1 mg Intravenous Q2H PRN Ivor Costa, MD      . pantoprazole (PROTONIX) EC tablet 40 mg  40 mg Oral BID Ivor Costa, MD   40 mg at 03/09/21 1036  . remdesivir 100 mg in sodium chloride 0.9 % 100 mL IVPB  100 mg Intravenous Daily Ivor Costa, MD 200 mL/hr at 03/09/21 1041 100 mg at 03/09/21 1041  . simvastatin (ZOCOR) tablet 20 mg  20 mg Oral q1800 Ivor Costa, MD   20 mg at 03/08/21 1733  . terazosin (HYTRIN) capsule 5 mg  5 mg Oral QHS Ivor Costa, MD   5 mg at 03/08/21 2209  . Warfarin - Pharmacist Dosing Inpatient   Does not apply q1600 Benita Gutter, [facility]   Given at 03/08/21 1716    VITAL SIGNS: BP 134/81 (BP Location: Right Arm)   Pulse 84   Temp 98.1 F (36.7 C)   Resp (!) 22   Ht 5\' 7"  (1.702 m)   Wt 83.9 kg   SpO2 94%   BMI 28.98 kg/m  Filed Weights   03/08/21 0529  Weight: 83.9 kg    Estimated body mass index is 28.98 kg/m as calculated from the following:   Height as of this encounter: 5\' 7"  (1.702 m).   Weight as of this encounter: 83.9 kg.  LABS: CBC:    Component Value Date/Time   WBC 5.4 03/09/2021 0425   HGB 12.2 (L) 03/09/2021 0425   HCT 36.3 (L) 03/09/2021 0425   PLT 184 03/09/2021 0425   Comprehensive Metabolic Panel:    Component Value Date/Time   NA 142 03/09/2021 0425   K 4.1 03/09/2021 0425   BUN 27 (H) 03/09/2021 0425   CREATININE 1.04 03/09/2021 0425   ALBUMIN 3.4 (L) 03/09/2021 0425     Review of Systems  Constitutional: Positive for fatigue.  Neurological: Positive for weakness.  Unless otherwise noted, a complete review of systems is negative.  The above conversation was completed via telephone due to the visitor restrictions during the COVID-19 pandemic. Thorough chart review and discussion with necessary members of the care team was completed as part of assessment. All issues were discussed  and addressed but no physical exam was performed.   Prognosis: Unable to determine guarded  Discharge Planning:  To Be Determined  Recommendations: . Full Code-as confirmed by wife.  Wife states she is in agreement with DNR/DNI recommendation however she would like to have further discussions with her husband if able as he is always stated he would want heroic measures.  She is realistic in her understanding as well  as her children. . Continue to treat the treatable . Family is clear and expressed goals for no surgical interventions, they continue to have ongoing discussions in regards to work-up for newly found brain mass, open to SNF for rehabilitation to allow patient every opportunity to continue to thrive and increase his strength prior to returning home. . Recommendation for outpatient palliative at minimum to continue ongoing support/discussions.  Marland Kitchen PMT will continue to support and follow as needed. Please call team line with urgent needs.   Palliative Performance Scale: PPS 30%              Wife expressed understanding and was in agreement with this plan.   Thank you for allowing the Palliative Medicine Team to assist in the care of this patient. Please utilize secure chat with additional questions, if there is no response within 30 minutes please call the above phone number.   Time In: 1245 Time Out: 1340 Time Total: 55 min   Visit consisted of counseling and education dealing with the complex and emotionally intense issues of symptom management and palliative care in the setting of serious and potentially life-threatening illness.Greater than 50%  of this time was spent counseling and coordinating care related to the above assessment and plan.  Signed by:  Alda Lea, AGPCNP-BC Palliative Medicine Team  Phone: [phone] Pager: [phone] Amion: [facility] Team providers are available by phone from 7am to 7pm daily and can be  reached through the team cell phone.  Should this patient require assistance outside of these hours, please call the patient's attending physician.

## 2021-03-09 NOTE — Evaluation (Signed)
Occupational Therapy Evaluation Patient Details Name: Alexander Lane MRN: [id number] DOB: 1930-07-17 Today's Date: 03/09/2021    History of Present Illness Pt is admitted for covid with complaints of weakness and falls. HIstory includes HTN, HLD, GERD, falls and cough. Per chart, multiple falls at home prior to admission.   Clinical Impression   Pt was seen for OT evaluation this date. Prior to hospital admission, pt reports he was independent with basic ADL and using a SPC vs 2WW for mobility. Pt lives with his spouse and is eager to return home with her. Pt alert and oriented x3 upon OT's arrival, agreeable to session. Pt unsure why he is here. Able to follow commands with verbal cues (also HOH). Currently pt demonstrates impairments as described below (See OT problem list) which functionally limit his ability to perform ADL/self-care tasks. Pt currently requires MIN-MOD A for LB ADL, MIN A for ADL transfers with RW + cues for safety and RW mgt. During repeated STS transfers EOB, pt had 1 LOB posteriorly and sat back down on EOB.  Pt would benefit from skilled OT services to address noted impairments and functional limitations (see below for any additional details) in order to maximize safety and independence while minimizing falls risk and caregiver burden. Upon hospital discharge, recommend STR to maximize pt safety and return to PLOF.     Follow Up Recommendations  SNF    Equipment Recommendations  3 in 1 bedside commode    Recommendations for Other Services       Precautions / Restrictions Precautions Precautions: Fall Restrictions Weight Bearing Restrictions: No      Mobility Bed Mobility Overal bed mobility: Needs Assistance Bed Mobility: Supine to Sit;Sit to Supine     Supine to sit: Min assist;HOB elevated Sit to supine: Min assist   General bed mobility comments: assist for BLE and trunk elevation    Transfers Overall transfer level: Needs assistance Equipment  used: 1 person hand held assist Transfers: Sit to/from Stand Sit to Stand: Mod assist         General transfer comment: unsteady and reaches out for furniture. Forward flexed posture. No AD available, however would benefit with further transfers    Balance Overall balance assessment: Needs assistance;History of Falls Sitting-balance support: Bilateral upper extremity supported Sitting balance-Leahy Scale: Fair     Standing balance support: Bilateral upper extremity supported Standing balance-Leahy Scale: Poor Standing balance comment: requires BUE support, has LOB posteriorly and sits back down on EOB                           ADL either performed or assessed with clinical judgement   ADL Overall ADL's : Needs assistance/impaired                                       General ADL Comments: Pt requires MIN-MOD A for LB ADL tasks involving STS transfers, MIN A for transfers, and set up and supervision for seated grooming tasks     Vision Patient Visual Report: No change from baseline       Perception     Praxis      Pertinent Vitals/Pain Pain Assessment: No/denies pain     Hand Dominance     Extremity/Trunk Assessment Upper Extremity Assessment Upper Extremity Assessment: Overall WFL for tasks assessed (grossly 4+/5)   Lower Extremity Assessment Lower  Extremity Assessment: Generalized weakness (R knee flexion contracture; B LE grossly 3+/5)       Communication Communication Communication: HOH   Cognition Arousal/Alertness: Awake/alert Behavior During Therapy: WFL for tasks assessed/performed Overall Cognitive Status: No family/caregiver present to determine baseline cognitive functioning                                 General Comments: Alert and oriented to place, month, year, self, and disoriented to situation (not sure why he is here), follows commands with cues, HOH   General Comments       Exercises Other  Exercises: Pt performed repeated STS transfers EOB with RW and MIN A + VC for hand placement and safety   Shoulder Instructions      Home Living Family/patient expects to be discharged to:: Private residence Living Arrangements: Spouse/significant other Available Help at Discharge: Family Type of Home: House Home Access: Stairs to enter Technical brewer of Steps: 7 Entrance Stairs-Rails: Right Home Layout: Two level;Bed/bath upstairs Alternate Level Stairs-Number of Steps: flight   Bathroom Shower/Tub: Teacher, early years/pre: Standard     Home Equipment: Environmental consultant - 4 wheels;Cane - quad;Cane - single point          Prior Functioning/Environment Level of Independence: Independent with assistive device(s)        Comments: primarily uses SPC for ambulation        OT Problem List: Decreased strength;Decreased activity tolerance;Impaired balance (sitting and/or standing);Decreased knowledge of use of DME or AE      OT Treatment/Interventions: Self-care/ADL training;Therapeutic exercise;Therapeutic activities;DME and/or AE instruction;Patient/family education;Balance training;Energy conservation    OT Goals(Current goals can be found in the care plan section) Acute Rehab OT Goals Patient Stated Goal: to go home OT Goal Formulation: With patient Time For Goal Achievement: 03/23/21 Potential to Achieve Goals: Good ADL Goals Pt Will Perform Lower Body Dressing: with supervision;with set-up;sit to/from stand Pt Will Transfer to Toilet: with supervision;with set-up;ambulating (elevated commode, LRAD For amb) Additional ADL Goal #1: Pt will verbalize plan to implement at least 1 learned falls prevention stategy.  OT Frequency: Min 2X/week   Barriers to D/C:            Co-evaluation              AM-PAC OT "6 Clicks" Daily Activity     Outcome Measure Help from another person eating meals?: A Little Help from another person taking care of personal  grooming?: A Little Help from another person toileting, which includes using toliet, bedpan, or urinal?: A Little Help from another person bathing (including washing, rinsing, drying)?: A Lot Help from another person to put on and taking off regular upper body clothing?: A Little Help from another person to put on and taking off regular lower body clothing?: A Lot 6 Click Score: 16   End of Session    Activity Tolerance: Patient tolerated treatment well Patient left: in bed;with bed alarm set;with call bell/phone within reach  OT Visit Diagnosis: Other abnormalities of gait and mobility (R26.89);Muscle weakness (generalized) (M62.81)                Time: 5427-0623 OT Time Calculation (min): 24 min Charges:  OT General Charges $OT Visit: 1 Visit OT Evaluation $OT Eval Moderate Complexity: 1 Mod OT Treatments $Self Care/Home Management : 8-22 mins  Hanley Hays, MPH, MS, OTR/L ascom [phone] 03/09/21, 2:36 PM

## 2021-03-10 DIAGNOSIS — U071 COVID-19: Secondary | ICD-10-CM | POA: Diagnosis not present

## 2021-03-10 LAB — CBC WITH DIFFERENTIAL/PLATELET
Abs Immature Granulocytes: 0.07 10*3/uL (ref 0.00–0.07)
Basophils Absolute: 0 10*3/uL (ref 0.0–0.1)
Basophils Relative: 0 %
Eosinophils Absolute: 0 10*3/uL (ref 0.0–0.5)
Eosinophils Relative: 0 %
HCT: 39.6 % (ref 39.0–52.0)
Hemoglobin: 13.9 g/dL (ref 13.0–17.0)
Immature Granulocytes: 1 %
Lymphocytes Relative: 11 %
Lymphs Abs: 1.2 10*3/uL (ref 0.7–4.0)
MCH: 30.4 pg (ref 26.0–34.0)
MCHC: 35.1 g/dL (ref 30.0–36.0)
MCV: 86.7 fL (ref 80.0–100.0)
Monocytes Absolute: 0.6 10*3/uL (ref 0.1–1.0)
Monocytes Relative: 6 %
Neutro Abs: 9.3 10*3/uL — ABNORMAL HIGH (ref 1.7–7.7)
Neutrophils Relative %: 82 %
Platelets: 228 10*3/uL (ref 150–400)
RBC: 4.57 MIL/uL (ref 4.22–5.81)
RDW: 12.7 % (ref 11.5–15.5)
WBC: 11.1 10*3/uL — ABNORMAL HIGH (ref 4.0–10.5)
nRBC: 0 % (ref 0.0–0.2)

## 2021-03-10 LAB — COMPREHENSIVE METABOLIC PANEL
ALT: 19 U/L (ref 0–44)
AST: 42 U/L — ABNORMAL HIGH (ref 15–41)
Albumin: 3.7 g/dL (ref 3.5–5.0)
Alkaline Phosphatase: 47 U/L (ref 38–126)
Anion gap: 10 (ref 5–15)
BUN: 34 mg/dL — ABNORMAL HIGH (ref 8–23)
CO2: 27 mmol/L (ref 22–32)
Calcium: 8.9 mg/dL (ref 8.9–10.3)
Chloride: 101 mmol/L (ref 98–111)
Creatinine, Ser: 1.15 mg/dL (ref 0.61–1.24)
GFR, Estimated: >60 mL/min (ref >60)
Glucose, Bld: 118 mg/dL — ABNORMAL HIGH (ref 70–99)
Potassium: 4 mmol/L (ref 3.5–5.1)
Sodium: 138 mmol/L (ref 135–145)
Total Bilirubin: 0.6 mg/dL (ref 0.3–1.2)
Total Protein: 6 g/dL — ABNORMAL LOW (ref 6.5–8.1)

## 2021-03-10 LAB — PROTIME-INR
INR: 1.6 — ABNORMAL HIGH (ref 0.8–1.2)
Prothrombin Time: 18.8 seconds — ABNORMAL HIGH (ref 11.4–15.2)

## 2021-03-10 MED ORDER — WARFARIN SODIUM 5 MG PO TABS
5.0000 mg | ORAL_TABLET | Freq: Once | ORAL | Status: AC
  Administered 2021-03-10: 5 mg via ORAL
  Filled 2021-03-10: qty 1

## 2021-03-10 NOTE — Progress Notes (Signed)
Physical Therapy Treatment Patient Details Name: Alexander Lane MRN: [id number] DOB: Aug 18, 1930 Today's Date: 03/10/2021    History of Present Illness Pt is admitted for covid with complaints of weakness and falls. HIstory includes HTN, HLD, GERD, falls and cough. Per chart, multiple falls at home prior to admission.    PT Comments    Pt is making good progress towards goals. Pt agreeable to ambulate to recliner. Still presents with weakness and balance deficits. Currently not at baseline level. Will continue to progress as able.   Follow Up Recommendations  SNF     Equipment Recommendations  Rolling walker with 5" wheels    Recommendations for Other Services       Precautions / Restrictions Precautions Precautions: Fall Restrictions Weight Bearing Restrictions: No    Mobility  Bed Mobility Overal bed mobility: Needs Assistance Bed Mobility: Supine to Sit;Sit to Supine     Supine to sit: Min guard     General bed mobility comments: needs cues for initiation. Once seated at EOB, able to sit with upright posture    Transfers Overall transfer level: Needs assistance Equipment used: Rolling walker (2 wheeled) Transfers: Sit to/from Stand Sit to Stand: Min assist         General transfer comment: cues for hand placement. Once standing with RW, limited weight bearing on R LE due to pain. L Lateral leaning  Ambulation/Gait Ambulation/Gait assistance: Mod assist Gait Distance (Feet): 5 Feet Assistive device: Rolling walker (2 wheeled) Gait Pattern/deviations: Step-to pattern     General Gait Details: decreased weight shift towards L with decreased R step length. manual facilitation to step with L LE. Poor use of RW keeping too far away from body, needing constant cues for safety.   Stairs             Wheelchair Mobility    Modified Rankin (Stroke Patients Only)       Balance Overall balance assessment: Needs assistance;History of  Falls Sitting-balance support: Bilateral upper extremity supported Sitting balance-Leahy Scale: Fair     Standing balance support: Bilateral upper extremity supported Standing balance-Leahy Scale: Fair                              Cognition Arousal/Alertness: Awake/alert Behavior During Therapy: WFL for tasks assessed/performed Overall Cognitive Status: Impaired/Different from baseline                                 General Comments: sleepy, however easily awakens to verbal commands. Pt thinks he's in [facility], re-oriented      Exercises Other Exercises Other Exercises: supine/seated ther-ex performed on B LE including SLRs, hip abd/add, and LAQ. All ther-ex performed x 10 reps with cues for sequencing    General Comments        Pertinent Vitals/Pain Pain Assessment: No/denies pain    Home Living                      Prior Function            PT Goals (current goals can now be found in the care plan section) Acute Rehab PT Goals Patient Stated Goal: to go home PT Goal Formulation: With patient Time For Goal Achievement: 03/23/21 Potential to Achieve Goals: Good Progress towards PT goals: Progressing toward goals    Frequency    Min 2X/week  PT Plan Current plan remains appropriate    Co-evaluation              AM-PAC PT "6 Clicks" Mobility   Outcome Measure  Help needed turning from your back to your side while in a flat bed without using bedrails?: A Little Help needed moving from lying on your back to sitting on the side of a flat bed without using bedrails?: A Little Help needed moving to and from a bed to a chair (including a wheelchair)?: A Lot Help needed standing up from a chair using your arms (e.g., wheelchair or bedside chair)?: A Lot Help needed to walk in hospital room?: A Lot Help needed climbing 3-5 steps with a railing? : Total 6 Click Score: 13    End of Session Equipment  Utilized During Treatment: Gait belt Activity Tolerance: Patient tolerated treatment well Patient left: in chair;with chair alarm set Nurse Communication: Mobility status PT Visit Diagnosis: Unsteadiness on feet (R26.81);Muscle weakness (generalized) (M62.81);Difficulty in walking, not elsewhere classified (R26.2)     Time: 1610-9604 PT Time Calculation (min) (ACUTE ONLY): 24 min  Charges:  $Gait Training: 8-22 mins $Therapeutic Exercise: 8-22 mins                     Greggory Stallion, PT, DPT [phone]    Hartlee Amedee 03/10/2021, 4:36 PM

## 2021-03-10 NOTE — Consult Note (Signed)
[facility] for Warfarin Indication: History of DVT / Afib  Patient Measurements: Height: 5\' 7"  (170.2 cm) Weight: 83.9 kg (185 lb) IBW/kg (Calculated) : 66.1  Labs: Recent Labs    03/08/21 0535 03/09/21 0425 03/10/21 0533  HGB 12.3* 12.2* 13.9  HCT 36.7* 36.3* 39.6  PLT 187 184 228  LABPROT 14.7 16.4* 18.8*  INR 1.2 1.3* 1.6*  CREATININE 1.39* 1.04 1.15  CKTOTAL 742* 589*  --   TROPONINIHS 14  --   --     Estimated Creatinine Clearance (by C-G formula based on SCr of 1.15 mg/dL) Male: 37.6 mL/min Male: 46 mL/min   Medical History: Past Medical History:  Diagnosis Date  . BPH (benign prostatic hyperplasia)   . Chronic kidney disease   . DVT (deep venous thrombosis) ([place]) 2014   right leg, IVC filter  . Dysrhythmia 2019   Paroxysmal atrial fibrillation  . Hypertension     Medications:  Warfarin 5 mg daily prior to admission per med rec  Per [facility] records: warfarin 5 mg alternating with 7.5 mg every other day  Last patient reported dose 5/21 at 1800  Assessment: Patient is an 87 y/o M with medical history as above including Afib / DVT s/p IVC filter on warfarin who presented with generalized weakness / falls. Subsequently found to be positive for COVID-19 infection. 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   Pharmacy has been consulted for management of warfarin while patient is in-house.   Baseline CBC with Hgb 12.3, Plt 187 Albumin: 4, other LFTs WNL DDIs: Dexamethasone  Date INR Plan  5/22 1.2 warfarin 7.5 mg  5/23 1.3 Warfarin 7.5mg   5/24 1.6         Goal of Therapy:  INR 2-3   Plan:  --Suspect INR low given patient was holding warfarin for EGD on 03/06/21 and likely just resumed within last 1-2 days --Will give warfarin 5 mg today as INR trending up. --Daily INR per protocol --CBC at least every 3 days per protocol  Noralee Space,  PharmD Clinical Pharmacist 03/10/2021 10:11 AM

## 2021-03-10 NOTE — Progress Notes (Signed)
PROGRESS NOTE  Alexander Lane  DOB: Mar 12, 1930  PCP: Rusty Aus, MD YSA:[id number]  DOA: 03/08/2021  LOS: 2 days  Hospital Day: 3   Chief Complaint  Patient presents with  . Weakness  . Fall    Brief narrative: Alexander Lane is a 87 y.o. adult with PMH significant for HTN, HLD,  DVT, A. fib on Coumadin, CKD 3A, GERD, BPH. Patient presented to the ED on 5/22 with complaint of generalized weakness, fall, cough, confusion, progressively worsening for the last several days. Patient had received all 3 doses of vaccine against COVID-19.  In the ED, blood pressure mostly elevated up to 206/67, heart rate 94 COVID PCR positive. Chest x-ray negative. CT head showed an anterior left hemisphere mixed density mass measuring at least 5 cm diameter with associated vasogenic edema, regional midline shift. Although a progressive left frontal Meningioma is possible, hypodensity and expansion of the anterior corpus callosum is suspicious for an infiltrative malignancy. MRI brain was obtained which showed a large enhancing mass in the left frontal lobe, extra-axial and consistent with meningioma with extensive mass-effect and edema in the left frontal lobe with 11 mm subfalcine herniation. Admitted to hospitalist service. Neurosurgery consulted.  Subjective: Patient was seen and examined this morning.   Propped up in bed.  Taking breakfast.  Feels better than yesterday.  Not on submental oxygen.    Assessment/Plan: COVID pneumonia Acute respiratory failure with hypoxia  -Presented with cough, shortness of breath, tachypnea. -COVID test: PCR positive on admission -Chest imaging: Chest x-ray negative for infiltrates. -Treatment: Currently on IV remdesivir and dexamethasone.  Currently breathing on room air. -Progression: Not on supplemental oxygen but still has significant weakness.  -Supportive care: Vitamin C, Zinc, PRN inhalers, Tylenol, Antitussives -Encouraged incentive  spirometry, prone position, out of bed and early mobilization as much as possible -WBC and inflammatory markers trend as below. Recent Labs  Lab 03/08/21 0526 03/08/21 0535 03/08/21 0631 03/08/21 1332 03/08/21 1656 03/09/21 0425 03/10/21 0533  SARSCOV2NAA POSITIVE*  --   --   --   --   --   --   WBC  --  6.1  --   --   --  5.4 11.1*  LATICACIDVEN  --   --   --  1.0 0.8  --   --   PROCALCITON  --  <0.10  --   --   --   --   --   DDIMER  --   --  0.99*  --   --  1.30*  --   FERRITIN  --   --  111  --   --  110  --   CRP  --   --   --  4.9*  --  6.2*  --   ALT  --  15  --   --   --  16 19   SIRS - POA -Patient met SIRS criteria on admission because of COVID infection.  No evidence of bacterial infection.  Procalcitonin negative.  Lactic acid level normal.  WBC count was normal.  Sepsis ruled out.  Hypertensive urgency -Systolic blood pressure on admission was over 200.   -On amlodipine and irbesartan at home.  Currently continued on the same.  Blood pressure reasonably controlled.  Acute metabolic encephalopathy:  -Likely multifactorial etiology, including COVID infection, dehydration, elevated blood pressure, intracranial mass. -Mental status improved back to baseline.  Intracranial mass  -CT-head and MRI brain finding as above showing a large meningioma.   -  On admission, patient was started on steroids and Keppra.   -Neurology consultation obtained with Dr. Izora Ribas.  No need of inpatient intervention.  Recommended to stop Keppra and wean off steroids in 5 to 7 days.  He will arrange for outpatient follow-up in couple of weeks.   Paroxysmal A. Fib Subtherapeutic INR -On Coumadin.  Subtherapeutic. -Pharmacy to monitor INR Recent Labs  Lab 03/08/21 0535 03/09/21 0425 03/10/21 0533  INR 1.2 1.3* 1.6*   AKI on CKD 3A -Creatinine was elevated to 1.39 on admission.  Improved with hydration.  Currently not on IV fluid Recent Labs    03/08/21 0535 03/09/21 0425  03/10/21 0533  BUN 29* 27* 34*  CREATININE 1.39* 1.04 1.15   BPH (benign prostatic hyperplasia) -Continue terazosin  DVT (deep venous thrombosis) -Continue Coumadin.  DVT scan of lower extremities negative.    GERD (gastroesophageal reflux disease) -Protonix  HLD (hyperlipidemia) -Zocor  generalized weakness/fall -PT OT eval obtained.  SNF recommended.  Mobility: PT eval appreciated. Code Status:   Code Status: Full Code  Nutritional status: Body mass index is 28.98 kg/m.     Diet Order            Diet Heart Room service appropriate? Yes; Fluid consistency: Thin  Diet effective now                 DVT prophylaxis: warfarin warfarin (COUMADIN) tablet 5 mg   Antimicrobials:  None Fluid: None Consultants: Neurosurgery signed off Family Communication:  Called and updated patient's wife Mrs. Hemminger on 5/23  Status is: Inpatient  Remains inpatient appropriate because: COVID treatment  Dispo: The patient is from: Home              Anticipated d/c is to: SNF recommended by PT. I have a feeling that he would ask to be discharged to home while waiting for 10 days of isolation to be over for SNF.              Patient currently is not medically stable to d/c.   Difficult to place patient No     Infusions:  . remdesivir 100 mg in NS 100 mL 100 mg (03/10/21 1126)    Scheduled Meds: . amLODipine  2.5 mg Oral Daily  . dexamethasone  4 mg Oral Q6H  . ipratropium  2 puff Inhalation Q4H  . irbesartan  150 mg Oral Daily  . pantoprazole  40 mg Oral BID  . simvastatin  20 mg Oral q1800  . terazosin  5 mg Oral QHS  . warfarin  5 mg Oral ONCE-1600  . Warfarin - Pharmacist Dosing Inpatient   Does not apply q1600    Antimicrobials: Anti-infectives (From admission, onward)   Start     Dose/Rate Route Frequency Ordered Stop   03/09/21 1000  remdesivir 100 mg in sodium chloride 0.9 % 100 mL IVPB       "Followed by" Linked Group Details   100 mg 200 mL/hr over 30  Minutes Intravenous Daily 03/08/21 0935 03/13/21 0959   03/08/21 1200  remdesivir 200 mg in sodium chloride 0.9% 250 mL IVPB       "Followed by" Linked Group Details   200 mg 580 mL/hr over 30 Minutes Intravenous Once 03/08/21 0935 03/08/21 1548      PRN meds: acetaminophen, acetaminophen, albuterol, dextromethorphan-guaiFENesin, hydrALAZINE, LORazepam   Objective: Vitals:   03/10/21 0839 03/10/21 1156  BP: (!) 151/77 (!) 147/71  Pulse: 80 73  Resp: 20 17  Temp: (!)  97.3 F (36.3 C) (!) 97.5 F (36.4 C)  SpO2: 97% 97%    Intake/Output Summary (Last 24 hours) at 03/10/2021 1220 Last data filed at 03/10/2021 0203 Gross per 24 hour  Intake 100 ml  Output 650 ml  Net -550 ml   Filed Weights   03/08/21 0529  Weight: 83.9 kg   Weight change:  Body mass index is 28.98 kg/m.   Physical Exam: General exam: Pleasant, elderly Caucasian male.  Sitting up in bed.  Not in distress.  Not on supplemental oxygen. Skin: No rashes, lesions or ulcers. HEENT: Atraumatic, normocephalic, no obvious bleeding Lungs: Clear on auscultation bilaterally.  No wheezing. CVS: Regular rate and rhythm, no murmur GI/Abd soft, nontender, nondistended, bowel sound present CNS: Alert, awake, oriented x3 Psychiatry: Mood appropriate Extremities: No pedal edema, no calf tenderness  Data Review: I have personally reviewed the laboratory data and studies available.  Recent Labs  Lab 03/08/21 0535 03/09/21 0425 03/10/21 0533  WBC 6.1 5.4 11.1*  NEUTROABS 4.5 4.3 9.3*  HGB 12.3* 12.2* 13.9  HCT 36.7* 36.3* 39.6  MCV 90.2 88.5 86.7  PLT 187 184 228   Recent Labs  Lab 03/08/21 0535 03/09/21 0425 03/10/21 0533  NA 143 142 138  K 4.0 4.1 4.0  CL 108 107 101  CO2 26 26 27   GLUCOSE 100* 122* 118*  BUN 29* 27* 34*  CREATININE 1.39* 1.04 1.15  CALCIUM 9.2 8.6* 8.9    F/u labs ordered. Unresulted Labs (From admission, onward)          Start     Ordered   03/09/21 0500  CBC with  Differential/Platelet  Daily,   STAT      03/08/21 1130   03/09/21 0500  Comprehensive metabolic panel  Daily,   STAT      03/08/21 1130   03/09/21 0500  Protime-INR  Daily,   R      03/08/21 1311   03/08/21 1131  Culture, sputum-assessment  Once,   STAT        03/08/21 1130   03/08/21 0526  Urinalysis, Complete w Microscopic  ONCE - STAT,   STAT        03/08/21 0525          Signed, Terrilee Croak, MD [facility] 03/10/2021

## 2021-03-11 DIAGNOSIS — G9389 Other specified disorders of brain: Secondary | ICD-10-CM | POA: Diagnosis not present

## 2021-03-11 DIAGNOSIS — N1831 Chronic kidney disease, stage 3a: Secondary | ICD-10-CM | POA: Diagnosis not present

## 2021-03-11 DIAGNOSIS — U071 COVID-19: Secondary | ICD-10-CM | POA: Diagnosis not present

## 2021-03-11 DIAGNOSIS — W19XXXA Unspecified fall, initial encounter: Secondary | ICD-10-CM | POA: Diagnosis not present

## 2021-03-11 LAB — CBC WITH DIFFERENTIAL/PLATELET
Abs Immature Granulocytes: 0.05 10*3/uL (ref 0.00–0.07)
Basophils Absolute: 0 10*3/uL (ref 0.0–0.1)
Basophils Relative: 0 %
Eosinophils Absolute: 0 10*3/uL (ref 0.0–0.5)
Eosinophils Relative: 0 %
HCT: 35 % — ABNORMAL LOW (ref 39.0–52.0)
Hemoglobin: 12.1 g/dL — ABNORMAL LOW (ref 13.0–17.0)
Immature Granulocytes: 1 %
Lymphocytes Relative: 10 %
Lymphs Abs: 1 10*3/uL (ref 0.7–4.0)
MCH: 29.9 pg (ref 26.0–34.0)
MCHC: 34.6 g/dL (ref 30.0–36.0)
MCV: 86.4 fL (ref 80.0–100.0)
Monocytes Absolute: 0.6 10*3/uL (ref 0.1–1.0)
Monocytes Relative: 6 %
Neutro Abs: 7.8 10*3/uL — ABNORMAL HIGH (ref 1.7–7.7)
Neutrophils Relative %: 83 %
Platelets: 217 10*3/uL (ref 150–400)
RBC: 4.05 MIL/uL — ABNORMAL LOW (ref 4.22–5.81)
RDW: 12.7 % (ref 11.5–15.5)
WBC: 9.4 10*3/uL (ref 4.0–10.5)
nRBC: 0 % (ref 0.0–0.2)

## 2021-03-11 LAB — COMPREHENSIVE METABOLIC PANEL
ALT: 17 U/L (ref 0–44)
AST: 28 U/L (ref 15–41)
Albumin: 3.2 g/dL — ABNORMAL LOW (ref 3.5–5.0)
Alkaline Phosphatase: 36 U/L — ABNORMAL LOW (ref 38–126)
Anion gap: 7 (ref 5–15)
BUN: 39 mg/dL — ABNORMAL HIGH (ref 8–23)
CO2: 27 mmol/L (ref 22–32)
Calcium: 8.3 mg/dL — ABNORMAL LOW (ref 8.9–10.3)
Chloride: 102 mmol/L (ref 98–111)
Creatinine, Ser: 0.96 mg/dL (ref 0.61–1.24)
GFR, Estimated: >60 mL/min (ref >60)
Glucose, Bld: 106 mg/dL — ABNORMAL HIGH (ref 70–99)
Potassium: 4 mmol/L (ref 3.5–5.1)
Sodium: 136 mmol/L (ref 135–145)
Total Bilirubin: 0.4 mg/dL (ref 0.3–1.2)
Total Protein: 5.3 g/dL — ABNORMAL LOW (ref 6.5–8.1)

## 2021-03-11 LAB — PROTIME-INR
INR: 2 — ABNORMAL HIGH (ref 0.8–1.2)
Prothrombin Time: 22.2 seconds — ABNORMAL HIGH (ref 11.4–15.2)

## 2021-03-11 MED ORDER — AMLODIPINE BESYLATE 5 MG PO TABS
5.0000 mg | ORAL_TABLET | Freq: Every day | ORAL | Status: DC
  Administered 2021-03-12 – 2021-03-15 (×4): 5 mg via ORAL
  Filled 2021-03-11 (×4): qty 1

## 2021-03-11 MED ORDER — WARFARIN SODIUM 7.5 MG PO TABS
7.5000 mg | ORAL_TABLET | Freq: Once | ORAL | Status: AC
  Administered 2021-03-11: 17:00:00 7.5 mg via ORAL
  Filled 2021-03-11: qty 1

## 2021-03-11 NOTE — Progress Notes (Signed)
   Daily Progress Note   Patient Name: Alexander Lane       Date: 03/11/2021 DOB: 1930/05/22  Age: 87 y.o. MRN#: [id number] Attending Physician: Nolberto Hanlon, MD Primary Care Physician: Rusty Aus, MD Admit Date: 03/08/2021  Reason for Consultation/Follow-up: Establishing goals of care  Subjective: Chart Reviewed. Updates Received.  Patient awake, alert and oriented. Denies pain or discomfort. Generalized weakness. No family is at the bedside.   I spoke with wife at length via phone. Updates provided. We reviewed goals of care. Wife and patient remains hopeful for continued improvement.   Wife states she and daughters are hopeful patient can go to rehab prior to returning home to allow him additional time to regain some strength. She is concerned with how he will do in the home with just her assistance. She voiced understanding of his continued improvements with working with PT.   Encouraged continued discussions regarding code status given guarded long-term prognosis.   All questions answered and support provided.   Length of Stay: 3 days  Vital Signs: BP (!) 145/92 (BP Location: Left Arm)   Pulse 77   Temp 97.8 F (36.6 C) (Oral)   Resp 19   Ht 5\' 7"  (1.702 m)   Wt 83.9 kg   SpO2 96%   BMI 28.98 kg/m  SpO2: SpO2: 96 % O2 Device: O2 Device: Room Air O2 Flow Rate:              Palliative Care Assessment & Plan  HPI: Palliative Care consult requested for goals of care discussion in this 87 y.o. adult with a medical history significant for lipidemia, GERD, hypertension, atrial fibrillation (Coumadin), BPH, CKD stage III, and DVT.  Patient presented from home via EMS with complaints of generalized weakness.  Wife reports patient fell twice within 24 hours.  COVID 19 PCR positive.  Chest x-ray negative.  CT of head showed anterior left hemisphere density mass measuring 5 cm with associated vasogenic edema, regional midline shift.  Possible left frontal meningioma,  hypodensity and expansion of the anterior corpus callosum is suspicious for an infiltrative malignancy.  Neurosurgery has been consulted recommendations for this medical management (steroids) with plans for outpatient follow-up.  Code Status:  Full code  Goals of Care/Recommendations:  Ongoing family discussions regarding goals of care/code status.   Patient showing some improvement. Both he and family remaining hopeful but also preparing for future (the unknown with brain mass). They are clear in expressed goals for no surgical interventions.   Wife and daughters are hopeful for rehab prior to discharging home with a goal of increasing strength.   PMT will continue to support and follow as needed.   Prognosis: Guarded  Discharge Planning: To Be Determined  Thank you for allowing the Palliative Medicine Team to assist in the care of this patient.  Time Total: 40 min.   Visit consisted of counseling and education dealing with the complex and emotionally intense issues of symptom management and palliative care in the setting of serious and potentially life-threatening illness.Greater than 50%  of this time was spent counseling and coordinating care related to the above assessment and plan.  Alda Lea, AGPCNP-BC  Palliative Medicine Team [phone]

## 2021-03-11 NOTE — Progress Notes (Signed)
PROGRESS NOTE    Alexander Lane  CZY:[id number] DOB: [birth year] DOA: 03/08/2021 PCP: Rusty Aus, MD    Brief Narrative:  Alexander Lane is a 87 y.o. adult with PMH significant for HTN, HLD, DVT, A. fib on Coumadin, CKD 3A, GERD, BPH. Patient presented to the ED on 5/22 with complaint of generalized weakness, fall, cough, confusion, progressively worsening for the last several days. Patient had received all 3 doses of vaccine against COVID-19.  In the ED, blood pressure mostly elevated up to 206/67, heart rate 94 COVID PCR positive. Chest x-ray negative. CT head showed an anterior left hemisphere mixed density mass measuring at least 5 cm diameter with associated vasogenic edema, regional midline shift. Although a progressive left frontal Meningioma is possible, hypodensity and expansion of the anterior corpus callosum is suspicious for an infiltrative malignancy. MRI brain was obtained which showed a large enhancing mass in the left frontal lobe, extra-axial and consistent with meningioma with extensive mass-effect and edema in the left frontal lobe with 11 mm subfalcine herniation. Admitted to hospitalist service. Neurosurgery consulted.    Consultants:   Palliative care  Procedures:   Antimicrobials:    remdesivir   Subjective: Denies worsening sob, cp, abd pain. Feels and appears weak  Objective: Vitals:   03/10/21 1758 03/11/21 0007 03/11/21 0443 03/11/21 0828  BP: 134/65  (!) 185/77 (!) 174/84  Pulse: 93 62 67 74  Resp: _0 Temp: 97.7 F (36.5 C) 97.6 F (36.4 C) 97.6 F (36.4 C) 97.8 F (36.6 C)  TempSrc: Oral Oral Oral Oral  SpO2: 98% 100% 97% 93%  Weight:      Height:        Intake/Output Summary (Last 24 hours) at 03/11/2021 0859 Last data filed at 03/11/2021 0447 Gross per 24 hour  Intake --  Output 800 ml  Net -800 ml   Filed Weights   03/08/21 0529  Weight: 83.9 kg    Examination: nad calm Decrease bs no w/r Reg s1s2  no gallop Soft benign +bs No edema  Awake and alert grossly intact Mood and affect appropriate in current setting   Data Reviewed: I have personally reviewed following labs and imaging studies  CBC: Recent Labs  Lab 03/08/21 0535 03/09/21 0425 03/10/21 0533 03/11/21 0533  WBC 6.1 5.4 11.1* 9.4  NEUTROABS 4.5 4.3 9.3* 7.8*  HGB 12.3* 12.2* 13.9 12.1*  HCT 36.7* 36.3* 39.6 35.0*  MCV 90.2 88.5 86.7 86.4  PLT 187 184 228 093   Basic Metabolic Panel: Recent Labs  Lab 03/08/21 0535 03/09/21 0425 03/10/21 0533 03/11/21 0533  NA 143 142 138 136  K 4.0 4.1 4.0 4.0  CL 108 107 101 102  CO2 _1 GLUCOSE 100* 122* 118* 106*  BUN 29* 27* 34* 39*  CREATININE 1.39* 1.04 1.15 0.96  CALCIUM 9.2 8.6* 8.9 8.3*   GFR: Estimated Creatinine Clearance (by C-G formula based on SCr of 0.96 mg/dL) Male: 45.1 mL/min Male: 55.1 mL/min Liver Function Tests: Recent Labs  Lab 03/08/21 0535 03/09/21 0425 03/10/21 0533 03/11/21 0533  AST 30 34 42* 28  ALT _2 ALKPHOS 52 46 47 36*  BILITOT 0.7 0.7 0.6 0.4  PROT 6.5 5.8* 6.0* 5.3*  ALBUMIN 4.0 3.4* 3.7 3.2*   No results for input(s): LIPASE, AMYLASE in the last 168 hours. No results for input(s): AMMONIA in the last 168 hours. Coagulation Profile: Recent Labs  Lab 03/08/21 0535 03/09/21  0425 03/10/21 0533 03/11/21 0533  INR 1.2 1.3* 1.6* 2.0*   Cardiac Enzymes: Recent Labs  Lab 03/08/21 0535 03/09/21 0425  CKTOTAL 742* 589*   BNP (last 3 results) No results for input(s): PROBNP in the last 8760 hours. HbA1C: No results for input(s): HGBA1C in the last 72 hours. CBG: No results for input(s): GLUCAP in the last 168 hours. Lipid Profile: No results for input(s): CHOL, HDL, LDLCALC, TRIG, CHOLHDL, LDLDIRECT in the last 72 hours. Thyroid Function Tests: No results for input(s): TSH, T4TOTAL, FREET4, T3FREE, THYROIDAB in the last 72 hours. Anemia Panel: Recent Labs    03/09/21 0425  FERRITIN 110    Sepsis Labs: Recent Labs  Lab 03/08/21 0535 03/08/21 1332 03/08/21 1656  PROCALCITON <0.10  --   --   LATICACIDVEN  --  1.0 0.8    Recent Results (from the past 240 hour(s))  Resp Panel by RT-PCR (Flu A&B, Covid) Nasopharyngeal Swab     Status: Abnormal   Collection Time: 03/08/21  5:26 AM   Specimen: Nasopharyngeal Swab; Nasopharyngeal(NP) swabs in vial transport medium  Result Value Ref Range Status   SARS Coronavirus 2 by RT PCR POSITIVE (A) NEGATIVE Final    Comment: RESULT CALLED TO, READ BACK BY AND VERIFIED WITH: ARIEL SMITH 03/08/21 0837 [facility] (NOTE) SARS-CoV-2 target nucleic acids are DETECTED.  The SARS-CoV-2 RNA is generally detectable in upper respiratory specimens during the acute phase of infection. Positive results are indicative of the presence of the identified virus, but do not rule out bacterial infection or co-infection with other pathogens not detected by the test. Clinical correlation with patient history and other diagnostic information is necessary to determine patient infection status. The expected result is Negative.  Fact Sheet for Patients: EntrepreneurPulse.com.au  Fact Sheet for Healthcare Providers: IncredibleEmployment.be  This test is not yet approved or cleared by the [place] FDA and  has been authorized for detection and/or diagnosis of SARS-CoV-2 by FDA under an Emergency Use Authorization (EUA).  This EUA will remain in effect (meaning this test can be use d) for the duration of  the COVID-19 declaration under Section 564(b)(1) of the Act, 21 U.S.C. section 360bbb-3(b)(1), unless the authorization is terminated or revoked sooner.     Influenza A by PCR NEGATIVE NEGATIVE Final   Influenza B by PCR NEGATIVE NEGATIVE Final    Comment: (NOTE) The Xpert Xpress SARS-CoV-2/FLU/RSV plus assay is intended as an aid in the diagnosis of influenza from Nasopharyngeal swab specimens and should not be  used as a sole basis for treatment. Nasal washings and aspirates are unacceptable for Xpert Xpress SARS-CoV-2/FLU/RSV testing.  Fact Sheet for Patients: EntrepreneurPulse.com.au  Fact Sheet for Healthcare Providers: IncredibleEmployment.be  This test is not yet approved or cleared by the [place] FDA and has been authorized for detection and/or diagnosis of SARS-CoV-2 by FDA under an Emergency Use Authorization (EUA). This EUA will remain in effect (meaning this test can be used) for the duration of the COVID-19 declaration under Section 564(b)(1) of the Act, 21 U.S.C. section 360bbb-3(b)(1), unless the authorization is terminated or revoked.  Performed at [facility], [address] [place], [place] [address]          Radiology Studies: No results found.      Scheduled Meds: . amLODipine  2.5 mg Oral Daily  . dexamethasone  4 mg Oral Q6H  . ipratropium  2 puff Inhalation Q4H  . irbesartan  150 mg Oral Daily  . pantoprazole  40 mg Oral BID  . simvastatin  20 mg Oral q1800  . terazosin  5 mg Oral QHS  . warfarin  7.5 mg Oral ONCE-1600  . Warfarin - Pharmacist Dosing Inpatient   Does not apply q1600   Continuous Infusions: . remdesivir 100 mg in NS 100 mL 100 mg (03/11/21 0840)    Assessment & Plan:   Principal Problem:   COVID-19 virus infection Active Problems:   BPH (benign prostatic hyperplasia)   CKD (chronic kidney disease), stage IIIa   DVT (deep venous thrombosis) (HCC)   Hypertension   PAF (paroxysmal atrial fibrillation) (HCC)   Brain mass   Acute metabolic encephalopathy   Hypertensive urgency   Fall at home, initial encounter   GERD (gastroesophageal reflux disease)   HLD (hyperlipidemia)   Sepsis ([place])   Elevated CK   COVID pneumonia Acute respiratory failure with hypoxia  -Presented with cough, shortness of breath, tachypnea. -COVID test: PCR positive on admission -Chest imaging:  Chest x-ray negative for infiltrates. 5/25- continue IV remdesivir and steroids Currently on room air Appears weak, PT recommends SNF Continue antitussives, vitamin C, zinc I-S, prone positioning     SIRS - POA -Patient met SIRS criteria on admission because of COVID infection.  5/25-no evidence of bacterial infection. LA and wbc nml Sepsis ruled out   Hypertensive urgency -Systolic blood pressure on admission was over 200.   On amlodipine and ARB's.  Has room for improvement we will increase amlodipine to 5 mg daily   Acute metabolic encephalopathy: -Likely multifactorial etiology, including COVID infection, dehydration, elevated blood pressure, intracranial mass. 5/25-MS improved and at baseline  Intracranial mass  -CT-head and MRI brain finding as above showing a large meningioma.   -On admission, patient was started on steroids and Keppra.   -Neurology consultation obtained with Dr. Izora Ribas.  No need of inpatient intervention.  Recommended to stop Keppra and wean off steroids in 5 to 7 days.  He will arrange for outpatient follow-up in couple of weeks.   Paroxysmal A. Fib Subtherapeutic INR -On Coumadin.  INR therapeutic at 2.0    AKI on CKD 3A -Creatinine was elevated to 1.39 on admission.  Improved with hydration.   BPH (benign prostatic hyperplasia) -Continue terazosin  DVT (deep venous thrombosis) -Continue Coumadin.  DVT scan of lower extremities negative.  f  GERD (gastroesophageal reflux disease) -Protonix  HLD (hyperlipidemia) -Zocor   generalized weakness/fall Elevated Ck, trending down, likely from fall.      DVT prophylaxis: coumadin Code Status:full Family Communication: none at baseline  Status is: Inpatient  Remains inpatient appropriate because:Unsafe d/c plan   Dispo: The patient is from: Home              Anticipated d/c is to: SNF              Patient currently is medically stable to d/c.   Difficult to place  patient No   Pt has to wait for 10 days of isolation to be over for SNF         LOS: 3 days   Time spent: 35 minutes with more than 50% on [place], MD [facility] Pager [phone]  If 7PM-7AM, please contact night-coverage 03/11/2021, 8:59 AM

## 2021-03-11 NOTE — Progress Notes (Signed)
Occupational Therapy Treatment Patient Details Name: Alexander Lane MRN: [id number] DOB: [birth year] Today's Date: 03/11/2021    History of present illness Pt is admitted for covid with complaints of weakness and falls. HIstory includes HTN, HLD, GERD, falls and cough. Per chart, multiple falls at home prior to admission.   OT comments  Pt seen for OT tx this date to f/u re: safety with ADLs/ADL mobility. Pt making limitd progress towards OT goals as of this session this date d/t being somewhat self-limiting with OOB activity. OT engages pt in repositioning with MOD/MAX A to scoot up to optimize positioning. Pt declines to get to recliner for meal time. OT educates re: benefits. Pt voices that he is afraid of being forgotten in chair as he was up "way too long last time". Therapeutic listening implemented. Pt educated re: importance of OOB activity including prevention of atrophy, skin breakdown, etc. Pt receptive to education, but not agreeable to getting OOB with OT at this time. In addition, OT engages pt in [facility] tasks to setup dinner tray. Pt requires SETUP to open some of the condiments/containers including the milk carton. Will continue to follow. Continue to anticipate that pt will require STR f/u with OT services upon d/c from acute setting.    Follow Up Recommendations  SNF    Equipment Recommendations  3 in 1 bedside commode    Recommendations for Other Services      Precautions / Restrictions Precautions Precautions: Fall Restrictions Weight Bearing Restrictions: No       Mobility Bed Mobility Overal bed mobility: Needs Assistance Bed Mobility: Supine to Sit;Sit to Supine     Supine to sit: Min assist Sit to supine: Min assist   General bed mobility comments: MOD/MAX A to scoot up towards HOB to optimize positioning for self feeding.    Transfers Overall transfer level: Needs assistance Equipment used: Rolling walker (2 wheeled) Transfers: Sit to/from Stand Sit  to Stand: Min assist         General transfer comment: pt declines to get OOB with OT at this time    Balance Overall balance assessment: Needs assistance;History of Falls Sitting-balance support: Bilateral upper extremity supported Sitting balance-Leahy Scale: Fair     Standing balance support: Bilateral upper extremity supported Standing balance-Leahy Scale: Fair                             ADL either performed or assessed with clinical judgement   ADL Overall ADL's : Needs assistance/impaired Eating/Feeding: Set up;Bed level Eating/Feeding Details (indicate cue type and reason): HOB elevated, high fowler's. Declines getting OOB to recliner to eat dinner. Assist to open some condiment packets, milk carton. Grooming: Wash/dry hands;Set up;Bed level                                 General ADL Comments: pt not agreeable to getting to recliner to eat dinner this afternoon/evening d/t fear of being left up in chair. Therapeutic listening implemented.     Vision Patient Visual Report: No change from baseline     Perception     Praxis      Cognition Arousal/Alertness: Awake/alert Behavior During Therapy: WFL for tasks assessed/performed Overall Cognitive Status: Impaired/Different from baseline  General Comments: more awake and more oriented this date; however still slightly confused        Exercises Other Exercises Other Exercises: OT engages pt in repositioning with MOD/MAX A to scoot up to optimize positioning. Pt declines to get to recliner for meal time. OT educates re: benefits. Pt voices that he is afraid of being forgotten in chair as he was up "way too long last time". Therapeutic listening implemented. Pt educated re: importance of OOB activity including prevention of atrophy, skin breakdown, etc. Pt receptive to education, but not agreeable to getting OOB with OT at this time. In addition, OT  engages pt in [facility] tasks to setup dinner tray. Pt requires SETUP to open some of the condiments/containers including the milk carton. Other Exercises: During mobility, pt noted to be soiled. Assisted with rolling in bed for linen change and CNA called to assist with hygiene.   Shoulder Instructions       General Comments      Pertinent Vitals/ Pain       Pain Assessment: No/denies pain  Home Living                                          Prior Functioning/Environment              Frequency  Min 2X/week        Progress Toward Goals  OT Goals(current goals can now be found in the care plan section)  Progress towards OT goals: OT to reassess next treatment (pt somewhat self-limiting which in-turn limits his progress toward OT goals.)  Acute Rehab OT Goals Patient Stated Goal: to go home OT Goal Formulation: With patient Time For Goal Achievement: 03/23/21 Potential to Achieve Goals: Good  Plan Discharge plan remains appropriate    Co-evaluation                 AM-PAC OT "6 Clicks" Daily Activity     Outcome Measure   Help from another person eating meals?: A Little Help from another person taking care of personal grooming?: A Little Help from another person toileting, which includes using toliet, bedpan, or urinal?: A Little Help from another person bathing (including washing, rinsing, drying)?: A Lot Help from another person to put on and taking off regular upper body clothing?: A Little Help from another person to put on and taking off regular lower body clothing?: A Lot 6 Click Score: 16    End of Session    OT Visit Diagnosis: Other abnormalities of gait and mobility (R26.89);Muscle weakness (generalized) (M62.81)   Activity Tolerance Patient tolerated treatment well   Patient Left in bed;with bed alarm set;with call bell/phone within reach   Nurse Communication          Time: 1610-9604 OT Time Calculation (min): 11  min  Charges: OT General Charges $OT Visit: 1 Visit OT Treatments $Self Care/Home Management : 8-22 mins  Gerrianne Scale, [place], OTR/L ascom [phone] 03/11/21, 5:39 PM

## 2021-03-11 NOTE — Care Management Important Message (Signed)
Important Message  Patient Details  Name: Alexander Lane MRN: [id number] Date of Birth: 10-02-1930   Medicare Important Message Given:  Yes  Patient is in an isolation room so I talked with him by phone [phone]) and reviewed the Important Message from Medicare with him.  He stated he understood the contents of the form and didn't foresee needing to appeal the discharge. I asked if he would like me to e-mail or mail him a copy and he replied no.  I wished him well and thanked him for his time.  Juliann Pulse A Juell Radney 03/11/2021, 10:47 AM

## 2021-03-11 NOTE — Consult Note (Signed)
[facility] for Warfarin Indication: History of DVT / Afib  Patient Measurements: Height: 5\' 7"  (170.2 cm) Weight: 83.9 kg (185 lb) IBW/kg (Calculated) : 66.1  Labs: Recent Labs    03/09/21 0425 03/10/21 0533 03/11/21 0533  HGB 12.2* 13.9 12.1*  HCT 36.3* 39.6 35.0*  PLT 184 228 217  LABPROT 16.4* 18.8* 22.2*  INR 1.3* 1.6* 2.0*  CREATININE 1.04 1.15 0.96  CKTOTAL 589*  --   --     Estimated Creatinine Clearance (by C-G formula based on SCr of 0.96 mg/dL) Male: 45.1 mL/min Male: 55.1 mL/min   Medical History: Past Medical History:  Diagnosis Date  . BPH (benign prostatic hyperplasia)   . Chronic kidney disease   . DVT (deep venous thrombosis) ([place]) 2014   right leg, IVC filter  . Dysrhythmia 2019   Paroxysmal atrial fibrillation  . Hypertension     Medications:  Warfarin 5 mg daily prior to admission per med rec  Per [facility] records: warfarin 5 mg alternating with 7.5 mg every other day  Last patient reported dose 5/21 at 1800  Assessment: Patient is an 87 y/o M with medical history as above including Afib / DVT s/p IVC filter on warfarin who presented with generalized weakness / falls. Subsequently found to be positive for COVID-19 infection. 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   Pharmacy has been consulted for management of warfarin while patient is in-house.   Suspect INR was low given patient was holding warfarin for EGD on 03/06/21 and likely just resumed within last 1-2 days  Baseline CBC with Hgb 12.3, Plt 187 Albumin: 4, other LFTs WNL DDIs: Dexamethasone  Date INR Plan  5/22 1.2 warfarin 7.5 mg  5/23 1.3 Warfarin 7.5mg   5/24 1.6 5 mg  5/25 2.0     Goal of Therapy:  INR 2-3   Plan:  --INR 2.0 therapeutic. Will order warfarin 7.5 mg x1 today. Consider resuming alternating schedule tomorrow if INR appropriate? --Daily INR per protocol. It  has been noted that elevated INRs have been seen in Covid+ patients-monitor. --CBC at least every 3 days per protocol  Noralee Space, PharmD Clinical Pharmacist 03/11/2021 7:29 AM

## 2021-03-11 NOTE — Progress Notes (Signed)
Physical Therapy Treatment Patient Details Name: Alexander Lane MRN: [id number] DOB: 1930-01-20 Today's Date: 03/11/2021    History of Present Illness Pt is admitted for covid with complaints of weakness and falls. HIstory includes HTN, HLD, GERD, falls and cough. Per chart, multiple falls at home prior to admission.    PT Comments    Pt is making limited progress towards goals. Pt reports frustration with RN care reporting he was left in the recliner for hours yesterday after calling for assistance. Today, he declines to get up to recliner due to fear of getting forgotten. Active listening given. Pt agreeable to therapy this date and would like to focus on balance. Pt still demonstrates poor dynamic balance and has difficulty following commands and sequencing with RW. Needs frequent cues for RW management. Appears more oriented today. Returned to bed per patient request at end of session. Will continue to progress as able.   Follow Up Recommendations  SNF     Equipment Recommendations  Rolling walker with 5" wheels    Recommendations for Other Services       Precautions / Restrictions Precautions Precautions: Fall Restrictions Weight Bearing Restrictions: No    Mobility  Bed Mobility Overal bed mobility: Needs Assistance Bed Mobility: Supine to Sit;Sit to Supine     Supine to sit: Min assist Sit to supine: Min assist   General bed mobility comments: needs assist for truncal elevation. Once seated at EOB, upright posture noted    Transfers Overall transfer level: Needs assistance Equipment used: Rolling walker (2 wheeled) Transfers: Sit to/from Stand Sit to Stand: Min assist         General transfer comment: needs bed elevated for successful transfer. RW used with unsteadiness noted. 2 LOB noted with min/mod assist for correction  Ambulation/Gait Ambulation/Gait assistance: Mod assist Gait Distance (Feet): 2 Feet Assistive device: Rolling walker (2  wheeled) Gait Pattern/deviations: Step-to pattern     General Gait Details: decreased stance time on R LE and has trouble sequencing RW. When asked to take side steps, he begins to ambulate forward and turning RW. Needs mod assist for correction.   Stairs             Wheelchair Mobility    Modified Rankin (Stroke Patients Only)       Balance Overall balance assessment: Needs assistance;History of Falls Sitting-balance support: Bilateral upper extremity supported Sitting balance-Leahy Scale: Fair     Standing balance support: Bilateral upper extremity supported Standing balance-Leahy Scale: Fair                              Cognition Arousal/Alertness: Awake/alert Behavior During Therapy: WFL for tasks assessed/performed Overall Cognitive Status: Impaired/Different from baseline                                 General Comments: more awake and more oriented this date; however still slightly confused      Exercises Other Exercises Other Exercises: Supine/seated therex performed including B LE SLR, alt. marching, LAQ, and seated bird dog alt UE/LE. Seated balance activities including forward reaching outside of BOS with B UE in all direction. Attempted standing marching performed, however limited due to pain with R LE WBing Other Exercises: During mobility, pt noted to be soiled. Assisted with rolling in bed for linen change and CNA called to assist with hygiene.    General  Comments        Pertinent Vitals/Pain Pain Assessment: No/denies pain    Home Living                      Prior Function            PT Goals (current goals can now be found in the care plan section) Acute Rehab PT Goals Patient Stated Goal: to go home PT Goal Formulation: With patient Time For Goal Achievement: 03/23/21 Potential to Achieve Goals: Good Progress towards PT goals: Progressing toward goals    Frequency    Min 2X/week      PT  Plan Current plan remains appropriate    Co-evaluation              AM-PAC PT "6 Clicks" Mobility   Outcome Measure  Help needed turning from your back to your side while in a flat bed without using bedrails?: A Little Help needed moving from lying on your back to sitting on the side of a flat bed without using bedrails?: A Little Help needed moving to and from a bed to a chair (including a wheelchair)?: A Lot Help needed standing up from a chair using your arms (e.g., wheelchair or bedside chair)?: A Lot Help needed to walk in hospital room?: A Lot Help needed climbing 3-5 steps with a railing? : Total 6 Click Score: 13    End of Session Equipment Utilized During Treatment: Gait belt Activity Tolerance: Patient tolerated treatment well Patient left: in bed;with bed alarm set Nurse Communication: Mobility status PT Visit Diagnosis: Unsteadiness on feet (R26.81);Muscle weakness (generalized) (M62.81);Difficulty in walking, not elsewhere classified (R26.2)     Time: 3646-8032 PT Time Calculation (min) (ACUTE ONLY): 23 min  Charges:  $Therapeutic Exercise: 8-22 mins $Therapeutic Activity: 8-22 mins                     Greggory Stallion, PT, DPT [phone]    Mili Piltz 03/11/2021, 3:40 PM

## 2021-03-12 DIAGNOSIS — U071 COVID-19: Secondary | ICD-10-CM | POA: Diagnosis not present

## 2021-03-12 LAB — CBC WITH DIFFERENTIAL/PLATELET
Abs Immature Granulocytes: 0.04 10*3/uL (ref 0.00–0.07)
Basophils Absolute: 0 10*3/uL (ref 0.0–0.1)
Basophils Relative: 0 %
Eosinophils Absolute: 0 10*3/uL (ref 0.0–0.5)
Eosinophils Relative: 0 %
HCT: 37.2 % — ABNORMAL LOW (ref 39.0–52.0)
Hemoglobin: 12.8 g/dL — ABNORMAL LOW (ref 13.0–17.0)
Immature Granulocytes: 1 %
Lymphocytes Relative: 13 %
Lymphs Abs: 1 10*3/uL (ref 0.7–4.0)
MCH: 30.4 pg (ref 26.0–34.0)
MCHC: 34.4 g/dL (ref 30.0–36.0)
MCV: 88.4 fL (ref 80.0–100.0)
Monocytes Absolute: 0.4 10*3/uL (ref 0.1–1.0)
Monocytes Relative: 5 %
Neutro Abs: 6.4 10*3/uL (ref 1.7–7.7)
Neutrophils Relative %: 81 %
Platelets: 208 10*3/uL (ref 150–400)
RBC: 4.21 MIL/uL — ABNORMAL LOW (ref 4.22–5.81)
RDW: 12.5 % (ref 11.5–15.5)
WBC: 7.9 10*3/uL (ref 4.0–10.5)
nRBC: 0 % (ref 0.0–0.2)

## 2021-03-12 LAB — PROTIME-INR
INR: 2 — ABNORMAL HIGH (ref 0.8–1.2)
Prothrombin Time: 23 seconds — ABNORMAL HIGH (ref 11.4–15.2)

## 2021-03-12 LAB — COMPREHENSIVE METABOLIC PANEL
ALT: 19 U/L (ref 0–44)
AST: 23 U/L (ref 15–41)
Albumin: 3.4 g/dL — ABNORMAL LOW (ref 3.5–5.0)
Alkaline Phosphatase: 41 U/L (ref 38–126)
Anion gap: 8 (ref 5–15)
BUN: 40 mg/dL — ABNORMAL HIGH (ref 8–23)
CO2: 29 mmol/L (ref 22–32)
Calcium: 8.9 mg/dL (ref 8.9–10.3)
Chloride: 101 mmol/L (ref 98–111)
Creatinine, Ser: 1.3 mg/dL — ABNORMAL HIGH (ref 0.61–1.24)
GFR, Estimated: 53 mL/min — ABNORMAL LOW (ref >60)
Glucose, Bld: 123 mg/dL — ABNORMAL HIGH (ref 70–99)
Potassium: 4.5 mmol/L (ref 3.5–5.1)
Sodium: 138 mmol/L (ref 135–145)
Total Bilirubin: 0.7 mg/dL (ref 0.3–1.2)
Total Protein: 5.6 g/dL — ABNORMAL LOW (ref 6.5–8.1)

## 2021-03-12 MED ORDER — WARFARIN SODIUM 5 MG PO TABS
5.0000 mg | ORAL_TABLET | Freq: Once | ORAL | Status: AC
  Administered 2021-03-12: 5 mg via ORAL
  Filled 2021-03-12: qty 1

## 2021-03-12 NOTE — Progress Notes (Signed)
PROGRESS NOTE    Alexander Lane  [id number]:[id number] DOB: 06-17-1930 DOA: 03/08/2021 PCP: Rusty Aus, MD    Brief Narrative:  Alexander Lane is a 87 y.o. adult with PMH significant for HTN, HLD, DVT, A. fib on Coumadin, CKD 3A, GERD, BPH. Patient presented to the ED on 5/22 with complaint of generalized weakness, fall, cough, confusion, progressively worsening for the last several days. Patient had received all 3 doses of vaccine against COVID-19.  In the ED, blood pressure mostly elevated up to 206/67, heart rate 94 COVID PCR positive. Chest x-ray negative. CT head showed an anterior left hemisphere mixed density mass measuring at least 5 cm diameter with associated vasogenic edema, regional midline shift. Although a progressive left frontal Meningioma is possible, hypodensity and expansion of the anterior corpus callosum is suspicious for an infiltrative malignancy. MRI brain was obtained which showed a large enhancing mass in the left frontal lobe, extra-axial and consistent with meningioma with extensive mass-effect and edema in the left frontal lobe with 11 mm subfalcine herniation. Admitted to hospitalist service. Neurosurgery consulted.  5/26-no complaints this am. No sob. No cp  Consultants:   Palliative care  Procedures:   Antimicrobials:    remdesivir   Subjective: Denies worsening sob,no cp.   Objective: Vitals:   03/11/21 2035 03/11/21 2356 03/12/21 0500 03/12/21 0741  BP: (!) 143/63 (!) 160/72 (!) 146/87 (!) 178/81  Pulse: 73 73 68 78  Resp: 20 18 18 20   Temp: 98 F (36.7 C) 97.9 F (36.6 C) 98 F (36.7 C) 97.7 F (36.5 C)  TempSrc: Oral Oral Oral Oral  SpO2: 96% 97% 95% 95%  Weight:      Height:        Intake/Output Summary (Last 24 hours) at 03/12/2021 0826 Last data filed at 03/12/2021 0502 Gross per 24 hour  Intake --  Output 1100 ml  Net -1100 ml   Filed Weights   03/08/21 0529  Weight: 83.9 kg    Examination: Nad,  calm Decrease bs , no wheezing Regular s1/s2 no gallop Soft benign +bs No edema Awake and alert and oriented x3  Grossly intact  Data Reviewed: I have personally reviewed following labs and imaging studies  CBC: Recent Labs  Lab 03/08/21 0535 03/09/21 0425 03/10/21 0533 03/11/21 0533 03/12/21 0555  WBC 6.1 5.4 11.1* 9.4 7.9  NEUTROABS 4.5 4.3 9.3* 7.8* 6.4  HGB 12.3* 12.2* 13.9 12.1* 12.8*  HCT 36.7* 36.3* 39.6 35.0* 37.2*  MCV 90.2 88.5 86.7 86.4 88.4  PLT 187 184 228 217 448   Basic Metabolic Panel: Recent Labs  Lab 03/08/21 0535 03/09/21 0425 03/10/21 0533 03/11/21 0533 03/12/21 0555  NA 143 142 138 136 138  K 4.0 4.1 4.0 4.0 4.5  CL 108 107 101 102 101  CO2 26 26 27 27 29   GLUCOSE 100* 122* 118* 106* 123*  BUN 29* 27* 34* 39* 40*  CREATININE 1.39* 1.04 1.15 0.96 1.30*  CALCIUM 9.2 8.6* 8.9 8.3* 8.9   GFR: Estimated Creatinine Clearance (by C-G formula based on SCr of 1.3 mg/dL (H)) Male: 33.3 mL/min (A) Male: 40.7 mL/min (A) Liver Function Tests: Recent Labs  Lab 03/08/21 0535 03/09/21 0425 03/10/21 0533 03/11/21 0533 03/12/21 0555  AST 30 34 42* 28 23  ALT 15 16 19 17 19   ALKPHOS 52 46 47 36* 41  BILITOT 0.7 0.7 0.6 0.4 0.7  PROT 6.5 5.8* 6.0* 5.3* 5.6*  ALBUMIN 4.0 3.4* 3.7 3.2* 3.4*   No  results for input(s): LIPASE, AMYLASE in the last 168 hours. No results for input(s): AMMONIA in the last 168 hours. Coagulation Profile: Recent Labs  Lab 03/08/21 0535 03/09/21 0425 03/10/21 0533 03/11/21 0533 03/12/21 0555  INR 1.2 1.3* 1.6* 2.0* 2.0*   Cardiac Enzymes: Recent Labs  Lab 03/08/21 0535 03/09/21 0425  CKTOTAL 742* 589*   BNP (last 3 results) No results for input(s): PROBNP in the last 8760 hours. HbA1C: No results for input(s): HGBA1C in the last 72 hours. CBG: No results for input(s): GLUCAP in the last 168 hours. Lipid Profile: No results for input(s): CHOL, HDL, LDLCALC, TRIG, CHOLHDL, LDLDIRECT in the last 72  hours. Thyroid Function Tests: No results for input(s): TSH, T4TOTAL, FREET4, T3FREE, THYROIDAB in the last 72 hours. Anemia Panel: No results for input(s): VITAMINB12, FOLATE, FERRITIN, TIBC, IRON, RETICCTPCT in the last 72 hours. Sepsis Labs: Recent Labs  Lab 03/08/21 0535 03/08/21 1332 03/08/21 1656  PROCALCITON <0.10  --   --   LATICACIDVEN  --  1.0 0.8    Recent Results (from the past 240 hour(s))  Resp Panel by RT-PCR (Flu A&B, Covid) Nasopharyngeal Swab     Status: Abnormal   Collection Time: 03/08/21  5:26 AM   Specimen: Nasopharyngeal Swab; Nasopharyngeal(NP) swabs in vial transport medium  Result Value Ref Range Status   SARS Coronavirus 2 by RT PCR POSITIVE (A) NEGATIVE Final    Comment: RESULT CALLED TO, READ BACK BY AND VERIFIED WITH: ARIEL SMITH 03/08/21 0837 [facility] (NOTE) SARS-CoV-2 target nucleic acids are DETECTED.  The SARS-CoV-2 RNA is generally detectable in upper respiratory specimens during the acute phase of infection. Positive results are indicative of the presence of the identified virus, but do not rule out bacterial infection or co-infection with other pathogens not detected by the test. Clinical correlation with patient history and other diagnostic information is necessary to determine patient infection status. The expected result is Negative.  Fact Sheet for Patients: EntrepreneurPulse.com.au  Fact Sheet for Healthcare Providers: IncredibleEmployment.be  This test is not yet approved or cleared by the [place] FDA and  has been authorized for detection and/or diagnosis of SARS-CoV-2 by FDA under an Emergency Use Authorization (EUA).  This EUA will remain in effect (meaning this test can be use d) for the duration of  the COVID-19 declaration under Section 564(b)(1) of the Act, 21 U.S.C. section 360bbb-3(b)(1), unless the authorization is terminated or revoked sooner.     Influenza A by PCR NEGATIVE  NEGATIVE Final   Influenza B by PCR NEGATIVE NEGATIVE Final    Comment: (NOTE) The Xpert Xpress SARS-CoV-2/FLU/RSV plus assay is intended as an aid in the diagnosis of influenza from Nasopharyngeal swab specimens and should not be used as a sole basis for treatment. Nasal washings and aspirates are unacceptable for Xpert Xpress SARS-CoV-2/FLU/RSV testing.  Fact Sheet for Patients: EntrepreneurPulse.com.au  Fact Sheet for Healthcare Providers: IncredibleEmployment.be  This test is not yet approved or cleared by the [place] FDA and has been authorized for detection and/or diagnosis of SARS-CoV-2 by FDA under an Emergency Use Authorization (EUA). This EUA will remain in effect (meaning this test can be used) for the duration of the COVID-19 declaration under Section 564(b)(1) of the Act, 21 U.S.C. section 360bbb-3(b)(1), unless the authorization is terminated or revoked.  Performed at [facility], [address] [place], [place] [address]          Radiology Studies: No results found.      Scheduled Meds: .  amLODipine  5 mg Oral Daily  . dexamethasone  4 mg Oral Q6H  . ipratropium  2 puff Inhalation Q4H  . irbesartan  150 mg Oral Daily  . pantoprazole  40 mg Oral BID  . simvastatin  20 mg Oral q1800  . terazosin  5 mg Oral QHS  . Warfarin - Pharmacist Dosing Inpatient   Does not apply q1600   Continuous Infusions: . remdesivir 100 mg in NS 100 mL 100 mg (03/11/21 0840)    Assessment & Plan:   Principal Problem:   COVID-19 virus infection Active Problems:   BPH (benign prostatic hyperplasia)   CKD (chronic kidney disease), stage IIIa   DVT (deep venous thrombosis) (HCC)   Hypertension   PAF (paroxysmal atrial fibrillation) (HCC)   Brain mass   Acute metabolic encephalopathy   Hypertensive urgency   Fall at home, initial encounter   GERD (gastroesophageal reflux disease)   HLD (hyperlipidemia)   Sepsis  ([place])   Elevated CK   COVID pneumonia Acute respiratory failure with hypoxia  -Presented with cough, shortness of breath, tachypnea. -COVID test: PCR positive on admission -Chest imaging: Chest x-ray negative for infiltrates. 5/26 continue iv remdesivir and steroid On RA Pt REC. snf Continue vit c, zn,  IS    SIRS - POA -Patient met SIRS criteria on admission because of COVID infection.  5/26 no evidence of bacterial infection LA and wbc nml Sepsis ruled out    Hypertensive urgency Improving, continue with current meds If still needs adjustment can increase amlodipine to 37m daily   Acute metabolic encephalopathy: -Likely multifactorial etiology, including COVID infection, dehydration, elevated blood pressure, intracranial mass. 5/26-MS improved and at baseline  Paroxysmal A. Fib Subtherapeutic INR On coumadin inr therapeutic   Intracranial mass  -CT-head and MRI brain finding as above showing a large meningioma.   -On admission, patient was started on steroids and Keppra.   -Neurology consultation obtained with Dr. YIzora Ribas  No need of inpatient intervention.  Recommended to stop Keppra and wean off steroids in 5 to 7 days.  He will arrange for outpatient follow-up in couple of weeks.       AKI on CKD 3A Improved with hydration Continue to monitor  BPH (benign prostatic hyperplasia) -Continue terazosin  DVT (deep venous thrombosis) -Continue Coumadin.  DVT scan of lower extremities negative.    GERD (gastroesophageal reflux disease) -Protonix  HLD (hyperlipidemia) -Zocor   generalized weakness/fall Elevated Ck, trending down, likely from fall.      DVT prophylaxis: coumadin Code Status:full Family Communication: updated wife  Status is: Inpatient  Remains inpatient appropriate because:Unsafe d/c plan   Dispo: The patient is from: Home              Anticipated d/c is to: SNF              Patient currently is medically stable  to d/c.   Difficult to place patient No   Pt has to wait for 10 days of isolation to be over for SNF         LOS: 4 days   Time spent: 35 minutes with more than 50% on [facility] MD [facility] Pager [phone]  If 7PM-7AM, please contact night-coverage 03/12/2021, 8:26 AM

## 2021-03-12 NOTE — NC FL2 (Signed)
[place] LEVEL OF CARE SCREENING TOOL     IDENTIFICATION  Patient Name: Alexander Lane Birthdate: 1930-01-16 Sex: adult Admission Date (Current Location): 03/08/2021  [place] and [place] Number:  Engineering geologist and Address:  [facility], [address], [place], [place] [address]      Provider Number: [id number]  Attending Physician Name and Address:  Nolberto Hanlon, MD  Relative Name and Phone Number:  Rafael Salway (wife) [phone]    Current Level of Care: Hospital Recommended Level of Care: [place] Prior Approval Number:    Date Approved/Denied:   PASRR Number: [id number]  Discharge Plan: SNF    Current Diagnoses: Patient Active Problem List   Diagnosis Date Noted  . COVID-19 virus infection 03/08/2021  . Sepsis ([place]) 03/08/2021  . Elevated CK 03/08/2021  . BPH (benign prostatic hyperplasia)   . CKD (chronic kidney disease), stage IIIa   . Hypertension   . PAF (paroxysmal atrial fibrillation) ([place])   . Brain mass   . Acute metabolic encephalopathy   . Hypertensive urgency   . Fall at home, initial encounter   . GERD (gastroesophageal reflux disease)   . HLD (hyperlipidemia)   . DVT (deep venous thrombosis) ([place]) 2014    Orientation RESPIRATION BLADDER Height & Weight     Self,Place  Normal Incontinent,External catheter Weight: 83.9 kg Height:  5\' 7"  (170.2 cm)  BEHAVIORAL SYMPTOMS/MOOD NEUROLOGICAL BOWEL NUTRITION STATUS      Incontinent Diet (heart healthy)  AMBULATORY STATUS COMMUNICATION OF NEEDS Skin   Limited Assist Verbally Normal                       Personal Care Assistance Level of Assistance  Bathing,Feeding,Dressing Bathing Assistance: Limited assistance Feeding assistance: Limited assistance Dressing Assistance: Limited assistance     Functional Limitations Info  Sight,Hearing Sight Info: Impaired Hearing Info: Impaired      SPECIAL CARE FACTORS FREQUENCY   PT (By licensed PT),OT (By licensed OT)     PT Frequency: 5 times per week OT Frequency: 5 times per week            Contractures Contractures Info: Not present    Additional Factors Info  Code Status,Allergies Code Status Info: Full Allergies Info: Celebrex           Current Medications (03/12/2021):  This is the current hospital active medication list Current Facility-Administered Medications  Medication Dose Route Frequency Provider Last Rate Last Admin  . acetaminophen (TYLENOL) suppository 650 mg  650 mg Rectal Q6H PRN Ivor Costa, MD      . acetaminophen (TYLENOL) tablet 650 mg  650 mg Oral Q6H PRN Shanlever, Pierce Crane, RPH      . albuterol (VENTOLIN HFA) 108 (90 Base) MCG/ACT inhaler 2 puff  2 puff Inhalation Q4H PRN Ivor Costa, MD      . amLODipine (NORVASC) tablet 5 mg  5 mg Oral Daily Nolberto Hanlon, MD   5 mg at 03/12/21 1005  . dexamethasone (DECADRON) tablet 4 mg  4 mg Oral Q6H Ivor Costa, MD   4 mg at 03/12/21 1229  . dextromethorphan-guaiFENesin (MUCINEX DM) 30-600 MG per 12 hr tablet 1 tablet  1 tablet Oral BID PRN Ivor Costa, MD   1 tablet at 03/11/21 0840  . hydrALAZINE (APRESOLINE) injection 5 mg  5 mg Intravenous Q2H PRN Ivor Costa, MD      . ipratropium (ATROVENT HFA) inhaler 2 puff  2 puff  Inhalation Q4H Ivor Costa, MD   2 puff at 03/12/21 1229  . irbesartan (AVAPRO) tablet 150 mg  150 mg Oral Daily Ivor Costa, MD   150 mg at 03/12/21 1005  . LORazepam (ATIVAN) injection 1 mg  1 mg Intravenous Q2H PRN Ivor Costa, MD      . pantoprazole (PROTONIX) EC tablet 40 mg  40 mg Oral BID Ivor Costa, MD   40 mg at 03/12/21 1005  . simvastatin (ZOCOR) tablet 20 mg  20 mg Oral q1800 Ivor Costa, MD   20 mg at 03/11/21 1637  . terazosin (HYTRIN) capsule 5 mg  5 mg Oral QHS Ivor Costa, MD   5 mg at 03/11/21 2138  . warfarin (COUMADIN) tablet 5 mg  5 mg Oral ONCE-1600 Hallaji, Sheema M, RPH      . Warfarin - Pharmacist Dosing Inpatient   Does not apply q1600 Benita Gutter  [facility]   Given at 03/10/21 1746     Discharge Medications: Please see discharge summary for a list of discharge medications.  Relevant Imaging Results:  Relevant Lab Results:   Additional Information SS# [id number]  Shelbie Hutching, RN

## 2021-03-12 NOTE — Plan of Care (Signed)

## 2021-03-12 NOTE — Consult Note (Signed)
[place] for Warfarin Indication: History of DVT / Afib  Patient Measurements: Height: 5\' 7"  (170.2 cm) Weight: 83.9 kg (185 lb) IBW/kg (Calculated) : 66.1  Labs: Recent Labs    03/10/21 0533 03/11/21 0533 03/12/21 0555  HGB 13.9 12.1* 12.8*  HCT 39.6 35.0* 37.2*  PLT 228 217 208  LABPROT 18.8* 22.2* 23.0*  INR 1.6* 2.0* 2.0*  CREATININE 1.15 0.96 1.30*    Estimated Creatinine Clearance (by C-G formula based on SCr of 1.3 mg/dL (H)) Male: 33.3 mL/min (A) Male: 40.7 mL/min (A)   Medical History: Past Medical History:  Diagnosis Date  . BPH (benign prostatic hyperplasia)   . Chronic kidney disease   . DVT (deep venous thrombosis) ([place]) 2014   right leg, IVC filter  . Dysrhythmia 2019   Paroxysmal atrial fibrillation  . Hypertension     Medications:  Warfarin 5 mg daily prior to admission per med rec  Per [facility] records: warfarin 5 mg alternating with 7.5 mg every other day  Last patient reported dose 5/21 at 1800  Assessment: Patient is an 87 y/o M with medical history as above including Afib / DVT s/p IVC filter on warfarin who presented with generalized weakness / falls. Subsequently found to be positive for COVID-19 infection. 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   Pharmacy has been consulted for management of warfarin while patient is in-house.   Suspect INR was low given patient was holding warfarin for EGD on 03/06/21 and likely just resumed within last 1-2 days  Baseline CBC with Hgb 12.3, Plt 187 Albumin: 4, other LFTs WNL DDIs: Dexamethasone  Date INR Plan  5/22 1.2 warfarin 7.5 mg  5/23 1.3 Warfarin 7.5mg   5/24 1.6 5 mg  5/25 2.0 7.5  5/26 2.0         Goal of Therapy:  INR 2-3   Plan:  --INR 2.0 therapeutic. Will order warfarin 5mg  x1 today. Consider resuming alternating schedule if INR is stable tomorrow.  --Daily INR per protocol.  It has been noted that elevated INRs have been seen in Covid+ patients-monitor. --CBC at least every 3 days per protocol  Pernell Dupre, PharmD, BCPS Clinical Pharmacist 03/12/2021 9:31 AM

## 2021-03-12 NOTE — TOC Initial Note (Signed)
Transition of Care [facility]) - Initial/Assessment Note    Patient Details  Name: Alexander Lane MRN: [id number] Date of Birth: 11-Apr-1930  [facility] CM/SW Contact:    Shelbie Hutching, RN Phone Number: 03/12/2021, 2:10 PM  Clinical Narrative:                 Patient admitted to the hospital with Alexander Lane.  Patient is from home with his wife and before getting sick was independent with ADL's.  RNCM spoke with patient's wife, Alexander Lane via phone about discharge planning.  PT is recommending SNF an Alexander Lane reports that she will not be able to care for him at home as she just found out she has COVID also.  Alexander Lane would like for patient to go for short term rehab before going back home.  RNCM will begin bed search.  Patient will likely have to complete isolation for 10 days in the hospital before going to rehab but RNCM will go ahead and complete workup.    Expected Discharge Plan: Skilled Nursing Facility Barriers to Discharge: Continued Medical Work up   Patient Goals and CMS Choice Patient states their goals for this hospitalization and ongoing recovery are:: Patient's wife would like for patient to go to rehab before going home CMS Medicare.gov Compare Post Acute Care list provided to:: Patient Represenative (must comment) Choice offered to / list presented to : Spouse  Expected Discharge Plan and Services Expected Discharge Plan: [place]   Discharge Planning Services: CM Consult Post Acute Care Choice: [place] Living arrangements for the past 2 months: Single Family Home                 DME Arranged: N/A DME Agency: NA       HH Arranged: NA          Prior Living Arrangements/Services Living arrangements for the past 2 months: Single Family Home Lives with:: Spouse Patient language and need for interpreter reviewed:: Yes Do you feel safe going back to the place where you live?: Yes      Need for Family Participation in Patient  Care: Yes (Comment) (brain mass, COVID) Care giver support system in place?: Yes (comment) (wife) Current home services: DME (cane, walker, wheelchair) Criminal Activity/Legal Involvement Pertinent to Current Situation/Hospitalization: No - Comment as needed  Activities of Daily Living   ADL Screening (condition at time of admission) Patient's cognitive ability adequate to safely complete daily activities?: No Is the patient deaf or have difficulty hearing?: Yes Does the patient have difficulty seeing, even when wearing glasses/contacts?: No Does the patient have difficulty concentrating, remembering, or making decisions?: No Patient able to express need for assistance with ADLs?: Yes Does the patient have difficulty dressing or bathing?: Yes Independently performs ADLs?: No Communication: Independent Dressing (OT): Needs assistance Is this a change from baseline?: Change from baseline, expected to last <3days Grooming: Needs assistance Is this a change from baseline?: Change from baseline, expected to last <3 days Feeding: Needs assistance Is this a change from baseline?: Change from baseline, expected to last <3 days Bathing: Needs assistance Is this a change from baseline?: Change from baseline, expected to last <3 days Toileting: Needs assistance Is this a change from baseline?: Change from baseline, expected to last <3 days In/Out Bed: Needs assistance Is this a change from baseline?: Change from baseline, expected to last <3 days Does the patient have difficulty walking or climbing stairs?: Yes Weakness of Legs: Both Weakness of  Arms/Hands: Both  Permission Sought/Granted Permission sought to share information with : Case Manager,Family Chief Financial Officer Permission granted to share information with : Yes, Verbal Permission Granted  Share Information with NAME: Alexander Lane  Permission granted to share info w AGENCY: SNFs  Permission granted to share info w  Relationship: wife     Emotional Assessment       Orientation: : Oriented to Self,Oriented to Place Alcohol / Substance Use: Not Applicable Psych Involvement: No (comment)  Admission diagnosis:  DVT (deep venous thrombosis) ([place]) [I82.409] 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 COVID-19 virus infection 03/08/2021  . Sepsis ([place]) 03/08/2021  . Elevated CK 03/08/2021  . BPH (benign prostatic hyperplasia)   . CKD (chronic kidney disease), stage IIIa   . Hypertension   . PAF (paroxysmal atrial fibrillation) ([place])   . Brain mass   . Acute metabolic encephalopathy   . Hypertensive urgency   . Fall at home, initial encounter   . GERD (gastroesophageal reflux disease)   . HLD (hyperlipidemia)   . DVT (deep venous thrombosis) ([place]) 2014   PCP:  Rusty Aus, MD Pharmacy:   [id number] - [place], [place] - [address] [place] [address] Phone: [phone] Fax: [phone]     Social Determinants of Health (SDOH) Interventions    Readmission Risk Interventions No flowsheet data found.

## 2021-03-13 ENCOUNTER — Inpatient Hospital Stay: Payer: Medicare Other

## 2021-03-13 DIAGNOSIS — U071 COVID-19: Secondary | ICD-10-CM | POA: Diagnosis not present

## 2021-03-13 LAB — COMPREHENSIVE METABOLIC PANEL
ALT: 18 U/L (ref 0–44)
AST: 18 U/L (ref 15–41)
Albumin: 3.3 g/dL — ABNORMAL LOW (ref 3.5–5.0)
Alkaline Phosphatase: 39 U/L (ref 38–126)
Anion gap: 7 (ref 5–15)
BUN: 48 mg/dL — ABNORMAL HIGH (ref 8–23)
CO2: 28 mmol/L (ref 22–32)
Calcium: 8.6 mg/dL — ABNORMAL LOW (ref 8.9–10.3)
Chloride: 103 mmol/L (ref 98–111)
Creatinine, Ser: 1.22 mg/dL (ref 0.61–1.24)
GFR, Estimated: 57 mL/min — ABNORMAL LOW (ref >60)
Glucose, Bld: 130 mg/dL — ABNORMAL HIGH (ref 70–99)
Potassium: 4.2 mmol/L (ref 3.5–5.1)
Sodium: 138 mmol/L (ref 135–145)
Total Bilirubin: 0.6 mg/dL (ref 0.3–1.2)
Total Protein: 5.3 g/dL — ABNORMAL LOW (ref 6.5–8.1)

## 2021-03-13 LAB — CBC WITH DIFFERENTIAL/PLATELET
Abs Immature Granulocytes: 0.06 10*3/uL (ref 0.00–0.07)
Basophils Absolute: 0 10*3/uL (ref 0.0–0.1)
Basophils Relative: 0 %
Eosinophils Absolute: 0 10*3/uL (ref 0.0–0.5)
Eosinophils Relative: 0 %
HCT: 36.4 % — ABNORMAL LOW (ref 39.0–52.0)
Hemoglobin: 12.8 g/dL — ABNORMAL LOW (ref 13.0–17.0)
Immature Granulocytes: 1 %
Lymphocytes Relative: 11 %
Lymphs Abs: 1 10*3/uL (ref 0.7–4.0)
MCH: 30.3 pg (ref 26.0–34.0)
MCHC: 35.2 g/dL (ref 30.0–36.0)
MCV: 86.1 fL (ref 80.0–100.0)
Monocytes Absolute: 0.5 10*3/uL (ref 0.1–1.0)
Monocytes Relative: 5 %
Neutro Abs: 7.6 10*3/uL (ref 1.7–7.7)
Neutrophils Relative %: 83 %
Platelets: 221 10*3/uL (ref 150–400)
RBC: 4.23 MIL/uL (ref 4.22–5.81)
RDW: 12.4 % (ref 11.5–15.5)
WBC: 9 10*3/uL (ref 4.0–10.5)
nRBC: 0 % (ref 0.0–0.2)

## 2021-03-13 LAB — PROTIME-INR
INR: 2.2 — ABNORMAL HIGH (ref 0.8–1.2)
Prothrombin Time: 24.1 seconds — ABNORMAL HIGH (ref 11.4–15.2)

## 2021-03-13 MED ORDER — WARFARIN SODIUM 7.5 MG PO TABS
7.5000 mg | ORAL_TABLET | ORAL | Status: DC
  Administered 2021-03-13: 7.5 mg via ORAL
  Filled 2021-03-13 (×2): qty 1

## 2021-03-13 MED ORDER — WARFARIN SODIUM 5 MG PO TABS
5.0000 mg | ORAL_TABLET | ORAL | Status: DC
  Administered 2021-03-14: 5 mg via ORAL
  Filled 2021-03-13: qty 1

## 2021-03-13 NOTE — Plan of Care (Signed)
  Problem: Education: Goal: Knowledge of General Education information will improve Description: Including pain rating scale, medication(s)/side effects and non-pharmacologic comfort measures Outcome: Progressing   Problem: Clinical Measurements: Goal: Ability to maintain clinical measurements within normal limits will improve Outcome: Progressing Goal: Will remain free from infection Outcome: Progressing Goal: Diagnostic test results will improve Outcome: Progressing Goal: Respiratory complications will improve Outcome: Progressing Goal: Cardiovascular complication will be avoided Outcome: Progressing   Problem: Activity: Goal: Risk for activity intolerance will decrease Outcome: Progressing   Problem: Nutrition: Goal: Adequate nutrition will be maintained Outcome: Progressing   Problem: Elimination: Goal: Will not experience complications related to bowel motility Outcome: Progressing Goal: Will not experience complications related to urinary retention Outcome: Progressing   Problem: Pain Managment: Goal: General experience of comfort will improve Outcome: Progressing   Problem: Safety: Goal: Ability to remain free from injury will improve Outcome: Progressing   Problem: Skin Integrity: Goal: Risk for impaired skin integrity will decrease Outcome: Progressing   Problem: Education: Goal: Knowledge of risk factors and measures for prevention of condition will improve Outcome: Progressing   Problem: Coping: Goal: Psychosocial and spiritual needs will be supported Outcome: Progressing   Problem: Respiratory: Goal: Will maintain a patent airway Outcome: Progressing Goal: Complications related to the disease process, condition or treatment will be avoided or minimized Outcome: Progressing

## 2021-03-13 NOTE — Evaluation (Signed)
Physical Therapy Treatment Patient Details Name: Alexander Lane MRN: [id number] DOB: 1929/11/15 Today's Date: 03/13/2021   History of Present Illness  Pt is admitted for covid with complaints of weakness and falls. HIstory includes HTN, HLD, GERD, falls and cough. Per chart, multiple falls at home prior to admission.    Clinical Impression  Pt was pleasant and motivated to participate during the session.  Pt put forth good effort throughout the session and SpO2 and HR remained WNL on room air. Pt required physical assistance with all functional tasks per below along with therapeutic rest breaks between multiple standing episodes and short walks.  Pt was generally weak in standing with difficulty taking steps and min A required for stability.  Pt will benefit from PT services in a SNF setting upon discharge to safely address deficits listed in patient problem list for decreased caregiver assistance and eventual return to PLOF.      Follow Up Recommendations SNF    Equipment Recommendations  Rolling walker with 5" wheels    Recommendations for Other Services       Precautions / Restrictions Precautions Precautions: Fall Restrictions Weight Bearing Restrictions: No      Mobility  Bed Mobility Overal bed mobility: Needs Assistance Bed Mobility: Supine to Sit;Sit to Supine     Supine to sit: Mod assist Sit to supine: Min assist   General bed mobility comments: Min-Mod A for BLE and trunk control    Transfers Overall transfer level: Needs assistance Equipment used: Rolling walker (2 wheeled) Transfers: Sit to/from Stand Sit to Stand: Min assist;Mod assist;From elevated surface         General transfer comment: Min to mod A to come to full upright position from an elevated surface  Ambulation/Gait Ambulation/Gait assistance: Min assist Gait Distance (Feet): 3 Feet x 3 Assistive device: Rolling walker (2 wheeled) Gait Pattern/deviations: Step-to pattern;Decreased  step length - right;Decreased step length - left Gait velocity: decreased   General Gait Details: Min A for stability with gait with short B step length and slow cadence  Stairs            Wheelchair Mobility    Modified Rankin (Stroke Patients Only)       Balance Overall balance assessment: Needs assistance;History of Falls Sitting-balance support: Bilateral upper extremity supported Sitting balance-Leahy Scale: Fair     Standing balance support: Bilateral upper extremity supported;During functional activity Standing balance-Leahy Scale: Poor Standing balance comment: Min A for stability                             Pertinent Vitals/Pain Pain Assessment: No/denies pain    Home Living                        Prior Function                 Hand Dominance        Extremity/Trunk Assessment                Communication      Cognition Arousal/Alertness: Awake/alert Behavior During Therapy: WFL for tasks assessed/performed Overall Cognitive Status: Within Functional Limits for tasks assessed                                        General Comments  Exercises Total Joint Exercises Ankle Circles/Pumps: AROM;Strengthening;Both;5 reps;10 reps Quad Sets: AROM;Strengthening;Both;5 reps;10 reps Heel Slides: AROM;Strengthening;Both;5 reps Hip ABduction/ADduction: AROM;AAROM;Strengthening;Both;10 reps Straight Leg Raises: 10 reps;Both;Strengthening;AAROM;AROM Long Arc Quad: AROM;Strengthening;Both;5 reps;10 reps Knee Flexion: 10 reps;5 reps;Both;Strengthening;AROM Other Exercises Other Exercises: Multiple sit to/from stands from an elevated surface with cues for controlled eccentric phase Other Exercises: Statis unsupported sitting at the EOB for improved activity tolerance and core strength   Assessment/Plan    PT Assessment Patient needs continued PT services  PT Problem List Decreased strength;Decreased  activity tolerance;Decreased balance;Decreased mobility;Decreased knowledge of use of DME;Decreased safety awareness       PT Treatment Interventions Gait training;DME instruction;Therapeutic exercise;Balance training;Functional mobility training;Therapeutic activities;Patient/family education    PT Goals (Current goals can be found in the Care Plan section)  Acute Rehab PT Goals Patient Stated Goal: to go home    Frequency Min 2X/week   Barriers to discharge Decreased caregiver support;Inaccessible home environment      Co-evaluation               AM-PAC PT "6 Clicks" Mobility  Outcome Measure Help needed turning from your back to your side while in a flat bed without using bedrails?: A Little Help needed moving from lying on your back to sitting on the side of a flat bed without using bedrails?: A Lot Help needed moving to and from a bed to a chair (including a wheelchair)?: A Lot Help needed standing up from a chair using your arms (e.g., wheelchair or bedside chair)?: A Lot Help needed to walk in hospital room?: A Lot Help needed climbing 3-5 steps with a railing? : Total 6 Click Score: 12    End of Session Equipment Utilized During Treatment: Gait belt Activity Tolerance: Patient tolerated treatment well Patient left: in bed;with call bell/phone within reach;with bed alarm set;with nursing/sitter in room Nurse Communication: Mobility status PT Visit Diagnosis: Unsteadiness on feet (R26.81);Muscle weakness (generalized) (M62.81);Difficulty in walking, not elsewhere classified (R26.2)    Time: 1515-1610 PT Time Calculation (min) (ACUTE ONLY): 55 min   Charges:     PT Treatments $Gait Training: 8-22 mins $Therapeutic Exercise: 8-22 mins $Therapeutic Activity: 23-37 mins       D. Royetta Asal PT, DPT 03/13/21, 4:22 PM

## 2021-03-13 NOTE — Care Management Important Message (Signed)
Important Message  Patient Details  Name: Alexander Lane MRN: [id number] Date of Birth: 09-05-1930   Medicare Important Message Given:  Other (see comment)  Patient is in an isolation room and I tried calling him 3 times today [phone]), first 2 times the line was busy and 3rd time no answer.  Previously reviewed the Important Message from Medicare on 03/11/21 and stated he understood his Medicare rights if he had a need to appeal.  [place] 03/13/2021, 2:57 PM

## 2021-03-13 NOTE — Progress Notes (Signed)
PROGRESS NOTE    Alexander Lane  QHU:[id number] DOB: December 01, 1929 DOA: 03/08/2021 PCP: Rusty Aus, MD    Brief Narrative:  Alexander Lane is a 87 y.o. adult with PMH significant for HTN, HLD, DVT, A. fib on Coumadin, CKD 3A, GERD, BPH. Patient presented to the ED on 5/22 with complaint of generalized weakness, fall, cough, confusion, progressively worsening for the last several days. Patient had received all 3 doses of vaccine against COVID-19.  In the ED, blood pressure mostly elevated up to 206/67, heart rate 94 COVID PCR positive. Chest x-ray negative. CT head showed an anterior left hemisphere mixed density mass measuring at least 5 cm diameter with associated vasogenic edema, regional midline shift. Although a progressive left frontal Meningioma is possible, hypodensity and expansion of the anterior corpus callosum is suspicious for an infiltrative malignancy. MRI brain was obtained which showed a large enhancing mass in the left frontal lobe, extra-axial and consistent with meningioma with extensive mass-effect and edema in the left frontal lobe with 11 mm subfalcine herniation. Admitted to hospitalist service. Neurosurgery consulted.  5/27- no sob or cp  Consultants:   Palliative care  Procedures:   Antimicrobials:    remdesivir   Subjective: Has no complaints today   Objective: Vitals:   03/13/21 0502 03/13/21 0829 03/13/21 1118 03/13/21 1120  BP: 140/76 (!) 145/74 129/74 129/74  Pulse: 81 85  80  Resp: (!) 22 16    Temp: 98.3 F (36.8 C) 98.3 F (36.8 C)  (!) 97.5 F (36.4 C)  TempSrc: Oral   Oral  SpO2: 97% 95%  96%  Weight:      Height:        Intake/Output Summary (Last 24 hours) at 03/13/2021 1316 Last data filed at 03/13/2021 0500 Gross per 24 hour  Intake 450 ml  Output 550 ml  Net -100 ml   Filed Weights   03/08/21 0529  Weight: 83.9 kg    Examination: Calm, NAD CTA no wheeze rales rhonchi's Regular S1-S2 no gallops Soft  benign positive bowel sounds No edema Awake and alert, grossly intact Mood and affect appropriate in current setting  Data Reviewed: I have personally reviewed following labs and imaging studies  CBC: Recent Labs  Lab 03/09/21 0425 03/10/21 0533 03/11/21 0533 03/12/21 0555 03/13/21 0551  WBC 5.4 11.1* 9.4 7.9 9.0  NEUTROABS 4.3 9.3* 7.8* 6.4 7.6  HGB 12.2* 13.9 12.1* 12.8* 12.8*  HCT 36.3* 39.6 35.0* 37.2* 36.4*  MCV 88.5 86.7 86.4 88.4 86.1  PLT 184 228 217 208 465   Basic Metabolic Panel: Recent Labs  Lab 03/09/21 0425 03/10/21 0533 03/11/21 0533 03/12/21 0555 03/13/21 0551  NA 142 138 136 138 138  K 4.1 4.0 4.0 4.5 4.2  CL 107 101 102 101 103  CO2 _0 GLUCOSE 122* 118* 106* 123* 130*  BUN 27* 34* 39* 40* 48*  CREATININE 1.04 1.15 0.96 1.30* 1.22  CALCIUM 8.6* 8.9 8.3* 8.9 8.6*   GFR: Estimated Creatinine Clearance (by C-G formula based on SCr of 1.22 mg/dL) Male: 35.5 mL/min Male: 43.3 mL/min Liver Function Tests: Recent Labs  Lab 03/09/21 0425 03/10/21 0533 03/11/21 0533 03/12/21 0555 03/13/21 0551  AST 34 42* _1 ALT _2 ALKPHOS 46 47 36* 41 39  BILITOT 0.7 0.6 0.4 0.7 0.6  PROT 5.8* 6.0* 5.3* 5.6* 5.3*  ALBUMIN 3.4* 3.7 3.2* 3.4* 3.3*   No results for input(s): LIPASE,  AMYLASE in the last 168 hours. No results for input(s): AMMONIA in the last 168 hours. Coagulation Profile: Recent Labs  Lab 03/09/21 0425 03/10/21 0533 03/11/21 0533 03/12/21 0555 03/13/21 0551  INR 1.3* 1.6* 2.0* 2.0* 2.2*   Cardiac Enzymes: Recent Labs  Lab 03/08/21 0535 03/09/21 0425  CKTOTAL 742* 589*   BNP (last 3 results) No results for input(s): PROBNP in the last 8760 hours. HbA1C: No results for input(s): HGBA1C in the last 72 hours. CBG: No results for input(s): GLUCAP in the last 168 hours. Lipid Profile: No results for input(s): CHOL, HDL, LDLCALC, TRIG, CHOLHDL, LDLDIRECT in the last 72 hours. Thyroid Function  Tests: No results for input(s): TSH, T4TOTAL, FREET4, T3FREE, THYROIDAB in the last 72 hours. Anemia Panel: No results for input(s): VITAMINB12, FOLATE, FERRITIN, TIBC, IRON, RETICCTPCT in the last 72 hours. Sepsis Labs: Recent Labs  Lab 03/08/21 0535 03/08/21 1332 03/08/21 1656  PROCALCITON <0.10  --   --   LATICACIDVEN  --  1.0 0.8    Recent Results (from the past 240 hour(s))  Resp Panel by RT-PCR (Flu A&B, Covid) Nasopharyngeal Swab     Status: Abnormal   Collection Time: 03/08/21  5:26 AM   Specimen: Nasopharyngeal Swab; Nasopharyngeal(NP) swabs in vial transport medium  Result Value Ref Range Status   SARS Coronavirus 2 by RT PCR POSITIVE (A) NEGATIVE Final    Comment: RESULT CALLED TO, READ BACK BY AND VERIFIED WITH: ARIEL SMITH 03/08/21 0837 [facility] (NOTE) SARS-CoV-2 target nucleic acids are DETECTED.  The SARS-CoV-2 RNA is generally detectable in upper respiratory specimens during the acute phase of infection. Positive results are indicative of the presence of the identified virus, but do not rule out bacterial infection or co-infection with other pathogens not detected by the test. Clinical correlation with patient history and other diagnostic information is necessary to determine patient infection status. The expected result is Negative.  Fact Sheet for Patients: EntrepreneurPulse.com.au  Fact Sheet for Healthcare Providers: IncredibleEmployment.be  This test is not yet approved or cleared by the [place] FDA and  has been authorized for detection and/or diagnosis of SARS-CoV-2 by FDA under an Emergency Use Authorization (EUA).  This EUA will remain in effect (meaning this test can be use d) for the duration of  the COVID-19 declaration under Section 564(b)(1) of the Act, 21 U.S.C. section 360bbb-3(b)(1), unless the authorization is terminated or revoked sooner.     Influenza A by PCR NEGATIVE NEGATIVE Final   Influenza B  by PCR NEGATIVE NEGATIVE Final    Comment: (NOTE) The Xpert Xpress SARS-CoV-2/FLU/RSV plus assay is intended as an aid in the diagnosis of influenza from Nasopharyngeal swab specimens and should not be used as a sole basis for treatment. Nasal washings and aspirates are unacceptable for Xpert Xpress SARS-CoV-2/FLU/RSV testing.  Fact Sheet for Patients: EntrepreneurPulse.com.au  Fact Sheet for Healthcare Providers: IncredibleEmployment.be  This test is not yet approved or cleared by the [place] FDA and has been authorized for detection and/or diagnosis of SARS-CoV-2 by FDA under an Emergency Use Authorization (EUA). This EUA will remain in effect (meaning this test can be used) for the duration of the COVID-19 declaration under Section 564(b)(1) of the Act, 21 U.S.C. section 360bbb-3(b)(1), unless the authorization is terminated or revoked.  Performed at [facility], [address], [place], [place] [address]          Radiology Studies: No results found.      Scheduled Meds: . amLODipine  5  mg Oral Daily  . dexamethasone  4 mg Oral Q6H  . ipratropium  2 puff Inhalation Q4H  . irbesartan  150 mg Oral Daily  . pantoprazole  40 mg Oral BID  . simvastatin  20 mg Oral q1800  . terazosin  5 mg Oral QHS  . warfarin  7.5 mg Oral QODAY   And  . [START ON 03/14/2021] warfarin  5 mg Oral QODAY  . Warfarin - Pharmacist Dosing Inpatient   Does not apply q1600   Continuous Infusions:   Assessment & Plan:   Principal Problem:   COVID-19 virus infection Active Problems:   BPH (benign prostatic hyperplasia)   CKD (chronic kidney disease), stage IIIa   DVT (deep venous thrombosis) ([facility])   Hypertension   PAF (paroxysmal atrial fibrillation) ([facility])   Brain mass   Acute metabolic encephalopathy   Hypertensive urgency   Fall at home, initial encounter   GERD (gastroesophageal reflux disease)   HLD (hyperlipidemia)    Sepsis ([place])   Elevated CK   COVID pneumonia Acute respiratory failure with hypoxia  -Presented with cough, shortness of breath, tachypnea. -COVID test: PCR positive on admission -Chest imaging: Chest x-ray negative for infiltrates. Continue vitamin C, zinc, incentive spirometer  SNF pending      SIRS - POA -Patient met SIRS criteria on admission because of COVID infection.  5/27 no evidence of bacterial infection  Lactic acid and WBC normal  Sepsis was ruled out   \   Hypertensive urgency I improved.   Continue current management.   Will continue amlodipine at current dose if need to can increase to 10 mg daily      Acute metabolic encephalopathy: -Likely multifactorial etiology, including COVID infection, dehydration, elevated blood pressure, intracranial mass. 5/27 MS improved/resolved.  Currently at baseline  Paroxysmal A. Fib Subtherapeutic INR On coumadin inr therapeutic   Intracranial mass  -CT-head and MRI brain finding as above showing a large meningioma.   -On admission, patient was started on steroids and Keppra.   -Neurology consultation obtained with Dr. Izora Ribas.  No need of inpatient intervention.  Recommended to stop Keppra and wean off steroids in 5 to 7 days.  He will arrange for outpatient follow-up in couple of weeks.       AKI on CKD 3A Improved with hydration Continue to monitor  BPH (benign prostatic hyperplasia) -Continue terazosin  DVT (deep venous thrombosis) -Continue Coumadin.  DVT scan of lower extremities negative.    GERD (gastroesophageal reflux disease) -Protonix  HLD (hyperlipidemia) -Zocor   generalized weakness/fall Elevated Ck, trending down, likely from fall.      DVT prophylaxis: coumadin Code Status:full Family Communication: updated wife  Status is: Inpatient  Remains inpatient appropriate because:Unsafe d/c plan   Dispo: The patient is from: Home              Anticipated d/c is to:  SNF              Patient currently is medically stable to d/c.   Difficult to place patient No   Pt has to wait for total 10 days of isolation to be over for SNF         LOS: 5 days   Time spent: 35 minutes with more than 50% on [place], MD [facility] Pager [phone]  If 7PM-7AM, please contact night-coverage 03/13/2021, 1:16 PM

## 2021-03-13 NOTE — Consult Note (Signed)
[facility] for Warfarin Indication: History of DVT / Afib  Patient Measurements: Height: 5\' 7"  (170.2 cm) Weight: 83.9 kg (185 lb) IBW/kg (Calculated) : 66.1  Labs: Recent Labs    03/11/21 0533 03/12/21 0555 03/13/21 0551  HGB 12.1* 12.8* 12.8*  HCT 35.0* 37.2* 36.4*  PLT 217 208 221  LABPROT 22.2* 23.0* 24.1*  INR 2.0* 2.0* 2.2*  CREATININE 0.96 1.30* 1.22    Estimated Creatinine Clearance (by C-G formula based on SCr of 1.22 mg/dL) Male: 35.5 mL/min Male: 43.3 mL/min   Medical History: Past Medical History:  Diagnosis Date  . BPH (benign prostatic hyperplasia)   . Chronic kidney disease   . DVT (deep venous thrombosis) ([place]) 2014   right leg, IVC filter  . Dysrhythmia 2019   Paroxysmal atrial fibrillation  . Hypertension     Medications:  Warfarin 5 mg daily prior to admission per med rec  Per [facility] records: warfarin 5 mg alternating with 7.5 mg every other day  Last patient reported dose 5/21 at 1800  Assessment: Patient is an 87 y/o M with medical history as above including Afib / DVT s/p IVC filter on warfarin who presented with generalized weakness / falls. Subsequently found to be positive for COVID-19 infection. 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   Pharmacy has been consulted for management of warfarin while patient is in-house.   Suspect INR was low given patient was holding warfarin for EGD on 03/06/21 and likely just resumed within last 1-2 days  Baseline CBC with Hgb 12.3, Plt 187 Albumin: 4, other LFTs WNL DDIs: Dexamethasone  Date INR Plan  5/22 1.2 warfarin 7.5 mg  5/23 1.3 Warfarin 7.5mg   5/24 1.6 5 mg  5/25 2.0 7.5  5/26 2.0 5mg   5/27 2.2             Goal of Therapy:  INR 2-3   Plan:  --INR 2.2 therapeutic. Will resume alternating home schedule of Warfarin 7.5mg  and warfarin 5mg   -- Will continue to monitor INR daily. It  has been noted that elevated INRs have been seen in Covid+ patients-monitor. --CBC at least every 3 days per protocol  Pernell Dupre, PharmD, BCPS Clinical Pharmacist 03/13/2021 8:31 AM

## 2021-03-14 DIAGNOSIS — U071 COVID-19: Secondary | ICD-10-CM | POA: Diagnosis not present

## 2021-03-14 LAB — PROTIME-INR
INR: 2.5 — ABNORMAL HIGH (ref 0.8–1.2)
Prothrombin Time: 27.3 seconds — ABNORMAL HIGH (ref 11.4–15.2)

## 2021-03-14 NOTE — Progress Notes (Signed)
PROGRESS NOTE    Alexander Lane  RJJ:[id number] DOB: [birth year] DOA: 03/08/2021 PCP: Rusty Aus, MD    Brief Narrative:  Alexander Lane is a 87 y.o. adult with PMH significant for HTN, HLD, DVT, A. fib on Coumadin, CKD 3A, GERD, BPH. Patient presented to the ED on 5/22 with complaint of generalized weakness, fall, cough, confusion, progressively worsening for the last several days. Patient had received all 3 doses of vaccine against COVID-19.  In the ED, blood pressure mostly elevated up to 206/67, heart rate 94 COVID PCR positive. Chest x-ray negative. CT head showed an anterior left hemisphere mixed density mass measuring at least 5 cm diameter with associated vasogenic edema, regional midline shift. Although a progressive left frontal Meningioma is possible, hypodensity and expansion of the anterior corpus callosum is suspicious for an infiltrative malignancy. MRI brain was obtained which showed a large enhancing mass in the left frontal lobe, extra-axial and consistent with meningioma with extensive mass-effect and edema in the left frontal lobe with 11 mm subfalcine herniation. Admitted to hospitalist service. Neurosurgery consulted.  5/28- has no complaints. Denies being sob, cp, dizzness, diarrhea  Consultants:   Palliative care  Procedures:   Antimicrobials:    remdesivir   Subjective: as above   Objective: Vitals:   03/13/21 2323 03/14/21 0419 03/14/21 0800 03/14/21 1122  BP: (!) 154/83 (!) 169/76 (!) 173/75 (!) 128/59  Pulse: 74 65 77 79  Resp: 20 16 18 18   Temp: 98.3 F (36.8 C) 97.6 F (36.4 C) 97.9 F (36.6 C) 98.3 F (36.8 C)  TempSrc: Oral Oral Oral   SpO2: 94% 96% 97% 97%  Weight:      Height:        Intake/Output Summary (Last 24 hours) at 03/14/2021 1316 Last data filed at 03/14/2021 1125 Gross per 24 hour  Intake 180 ml  Output 1025 ml  Net -845 ml   Filed Weights   03/08/21 0529  Weight: 83.9 kg    Examination: Calm,  nad cta no wr/r Regular s1/s2 no gallop Soft benign +bs No edema Awake and alert/oriented, grossly intact Mood and affect appropriate in current setting  Data Reviewed: I have personally reviewed following labs and imaging studies  CBC: Recent Labs  Lab 03/09/21 0425 03/10/21 0533 03/11/21 0533 03/12/21 0555 03/13/21 0551  WBC 5.4 11.1* 9.4 7.9 9.0  NEUTROABS 4.3 9.3* 7.8* 6.4 7.6  HGB 12.2* 13.9 12.1* 12.8* 12.8*  HCT 36.3* 39.6 35.0* 37.2* 36.4*  MCV 88.5 86.7 86.4 88.4 86.1  PLT 184 228 217 208 016   Basic Metabolic Panel: Recent Labs  Lab 03/09/21 0425 03/10/21 0533 03/11/21 0533 03/12/21 0555 03/13/21 0551  NA 142 138 136 138 138  K 4.1 4.0 4.0 4.5 4.2  CL 107 101 102 101 103  CO2 26 27 27 29 28   GLUCOSE 122* 118* 106* 123* 130*  BUN 27* 34* 39* 40* 48*  CREATININE 1.04 1.15 0.96 1.30* 1.22  CALCIUM 8.6* 8.9 8.3* 8.9 8.6*   GFR: Estimated Creatinine Clearance (by C-G formula based on SCr of 1.22 mg/dL) Male: 34.8 mL/min Male: 42.5 mL/min Liver Function Tests: Recent Labs  Lab 03/09/21 0425 03/10/21 0533 03/11/21 0533 03/12/21 0555 03/13/21 0551  AST 34 42* 28 23 18   ALT 16 19 17 19 18   ALKPHOS 46 47 36* 41 39  BILITOT 0.7 0.6 0.4 0.7 0.6  PROT 5.8* 6.0* 5.3* 5.6* 5.3*  ALBUMIN 3.4* 3.7 3.2* 3.4* 3.3*   No results  for input(s): LIPASE, AMYLASE in the last 168 hours. No results for input(s): AMMONIA in the last 168 hours. Coagulation Profile: Recent Labs  Lab 03/10/21 0533 03/11/21 0533 03/12/21 0555 03/13/21 0551 03/14/21 0543  INR 1.6* 2.0* 2.0* 2.2* 2.5*   Cardiac Enzymes: Recent Labs  Lab 03/08/21 0535 03/09/21 0425  CKTOTAL 742* 589*   BNP (last 3 results) No results for input(s): PROBNP in the last 8760 hours. HbA1C: No results for input(s): HGBA1C in the last 72 hours. CBG: No results for input(s): GLUCAP in the last 168 hours. Lipid Profile: No results for input(s): CHOL, HDL, LDLCALC, TRIG, CHOLHDL, LDLDIRECT in the  last 72 hours. Thyroid Function Tests: No results for input(s): TSH, T4TOTAL, FREET4, T3FREE, THYROIDAB in the last 72 hours. Anemia Panel: No results for input(s): VITAMINB12, FOLATE, FERRITIN, TIBC, IRON, RETICCTPCT in the last 72 hours. Sepsis Labs: Recent Labs  Lab 03/08/21 0535 03/08/21 1332 03/08/21 1656  PROCALCITON <0.10  --   --   LATICACIDVEN  --  1.0 0.8    Recent Results (from the past 240 hour(s))  Resp Panel by RT-PCR (Flu A&B, Covid) Nasopharyngeal Swab     Status: Abnormal   Collection Time: 03/08/21  5:26 AM   Specimen: Nasopharyngeal Swab; Nasopharyngeal(NP) swabs in vial transport medium  Result Value Ref Range Status   SARS Coronavirus 2 by RT PCR POSITIVE (A) NEGATIVE Final    Comment: RESULT CALLED TO, READ BACK BY AND VERIFIED WITH: ARIEL SMITH 03/08/21 0837 [facility] (NOTE) SARS-CoV-2 target nucleic acids are DETECTED.  The SARS-CoV-2 RNA is generally detectable in upper respiratory specimens during the acute phase of infection. Positive results are indicative of the presence of the identified virus, but do not rule out bacterial infection or co-infection with other pathogens not detected by the test. Clinical correlation with patient history and other diagnostic information is necessary to determine patient infection status. The expected result is Negative.  Fact Sheet for Patients: EntrepreneurPulse.com.au  Fact Sheet for Healthcare Providers: IncredibleEmployment.be  This test is not yet approved or cleared by the [place] FDA and  has been authorized for detection and/or diagnosis of SARS-CoV-2 by FDA under an Emergency Use Authorization (EUA).  This EUA will remain in effect (meaning this test can be use d) for the duration of  the COVID-19 declaration under Section 564(b)(1) of the Act, 21 U.S.C. section 360bbb-3(b)(1), unless the authorization is terminated or revoked sooner.     Influenza A by PCR  NEGATIVE NEGATIVE Final   Influenza B by PCR NEGATIVE NEGATIVE Final    Comment: (NOTE) The Xpert Xpress SARS-CoV-2/FLU/RSV plus assay is intended as an aid in the diagnosis of influenza from Nasopharyngeal swab specimens and should not be used as a sole basis for treatment. Nasal washings and aspirates are unacceptable for Xpert Xpress SARS-CoV-2/FLU/RSV testing.  Fact Sheet for Patients: EntrepreneurPulse.com.au  Fact Sheet for Healthcare Providers: IncredibleEmployment.be  This test is not yet approved or cleared by the [place] FDA and has been authorized for detection and/or diagnosis of SARS-CoV-2 by FDA under an Emergency Use Authorization (EUA). This EUA will remain in effect (meaning this test can be used) for the duration of the COVID-19 declaration under Section 564(b)(1) of the Act, 21 U.S.C. section 360bbb-3(b)(1), unless the authorization is terminated or revoked.  Performed at [facility], [address] [place], [place] [address]          Radiology Studies: [facility] Chest [place] 1 View  Result Date: 03/13/2021 CLINICAL DATA:  Weakness and cough EXAM: PORTABLE CHEST 1 VIEW COMPARISON:  03/08/2021 FINDINGS: The heart size and mediastinal contours are within normal limits. Aortic atherosclerosis. Both lungs are clear. The visualized skeletal structures are unremarkable. IMPRESSION: No active disease. Electronically Signed   By: Donavan Foil M.D.   On: 03/13/2021 19:07        Scheduled Meds: . amLODipine  5 mg Oral Daily  . dexamethasone  4 mg Oral Q6H  . ipratropium  2 puff Inhalation Q4H  . irbesartan  150 mg Oral Daily  . pantoprazole  40 mg Oral BID  . simvastatin  20 mg Oral q1800  . terazosin  5 mg Oral QHS  . warfarin  7.5 mg Oral QODAY   And  . warfarin  5 mg Oral QODAY  . Warfarin - Pharmacist Dosing Inpatient   Does not apply q1600   Continuous Infusions:   Assessment & Plan:   Principal  Problem:   COVID-19 virus infection Active Problems:   BPH (benign prostatic hyperplasia)   CKD (chronic kidney disease), stage IIIa   DVT (deep venous thrombosis) ([facility])   Hypertension   PAF (paroxysmal atrial fibrillation) ([facility])   Brain mass   Acute metabolic encephalopathy   Hypertensive urgency   Fall at home, initial encounter   GERD (gastroesophageal reflux disease)   HLD (hyperlipidemia)   Sepsis ([place])   Elevated CK   COVID pneumonia Acute respiratory failure with hypoxia  -Presented with cough, shortness of breath, tachypnea. -COVID test: PCR positive on admission -Chest imaging: Chest x-ray negative for infiltrates. 5/28 received Remdesivir.  Continue steroid Clinically improved SNF pending, as he needs 10 days of isolation  continue vitamin C, zinc, I-S     SIRS - POA -Patient met SIRS criteria on admission because of COVID infection.  5/28 no evidence of bacterial infection  Lactic acid and WBC normal Sepsis ruled out      Hypertensive urgency Improved Continue with amlodipine, terazosin, Avapro      Acute metabolic encephalopathy: -Likely multifactorial etiology, including COVID infection, dehydration, elevated blood pressure, intracranial mass. 5/28 MS improved/resolved.  Currently at baseline  Paroxysmal A. Fib Subtherapeutic INR On Coumadin, INR therapeutic   Intracranial mass  -CT-head and MRI brain finding as above showing a large meningioma.   -On admission, patient was started on steroids and Keppra.   -Neurology consultation obtained with Dr. Izora Ribas.  No need of inpatient intervention.  Recommended to stop Keppra and wean off steroids in 5 to 7 days.  He will arrange for outpatient follow-up in couple of weeks.       AKI on CKD 3A Improved with hydration Continue to monitor  BPH (benign prostatic hyperplasia) -Continue terazosin  DVT (deep venous thrombosis) -Continue Coumadin.  DVT scan of lower extremities  negative.    GERD (gastroesophageal reflux disease) -Protonix  HLD (hyperlipidemia) -Zocor   generalized weakness/fall Elevated Ck, trending down, likely from fall.      DVT prophylaxis: coumadin Code Status:full Family Communication: None at bedside  Status is: Inpatient  Remains inpatient appropriate because:Unsafe d/c plan   Dispo: The patient is from: Home              Anticipated d/c is to: SNF              Patient currently is medically stable to d/c.   Difficult to place patient No   Pt has to wait for total 10 days of isolation to be over for SNF, SNF next week  LOS: 6 days   Time spent: 35 minutes with more than 50% on [place], MD [facility] Pager [phone]  If 7PM-7AM, please contact night-coverage 03/14/2021, 1:16 PM

## 2021-03-14 NOTE — Progress Notes (Signed)
Pt requests nursing staff not "worry" his wife with a phone call. States he already spoke with her this morning and she has covid and just needs to rest.

## 2021-03-14 NOTE — TOC Progression Note (Signed)
Transition of Care [facility]) - Progression Note    Patient Details  Name: Alexander Lane MRN: [id number] Date of Birth: 09/11/1930  Transition [facility] CM/SW Contact  Izola Price, RN Phone Number: 03/14/2021, 3:09 PM  Clinical Narrative:  03/14/21 Attempted to discuss SNF choices as [facility] and [facility] have accepted. Patient was wheezing too much to talk a lot, but said he had not discussed it with his wife and did not want CM to contact her as she was sick with Covid as well. He said he had 3-4 more days in Covid isolation, and this was confirmed by provider in progression earlier today. Will discuss with patient tomorrow when not so short of breath. Simmie Davies RN CM     Expected Discharge Plan: Skilled Nursing Facility Barriers to Discharge: Continued Medical Work up  Expected Discharge Plan and Services Expected Discharge Plan: [place]   Discharge Planning Services: CM Consult Post Acute Care Choice: [place] Living arrangements for the past 2 months: Single Family Home                 DME Arranged: N/A DME Agency: NA       HH Arranged: NA           Social Determinants of Health (SDOH) Interventions    Readmission Risk Interventions No flowsheet data found.

## 2021-03-14 NOTE — Consult Note (Signed)
[facility] for Warfarin Indication: History of DVT / Afib  Patient Measurements: Height: 5\' 7"  (170.2 cm) Weight: 83.9 kg (185 lb) IBW/kg (Calculated) : 66.1  Labs: Recent Labs    03/12/21 0555 03/13/21 0551 03/14/21 0543  HGB 12.8* 12.8*  --   HCT 37.2* 36.4*  --   PLT 208 221  --   LABPROT 23.0* 24.1* 27.3*  INR 2.0* 2.2* 2.5*  CREATININE 1.30* 1.22  --     Estimated Creatinine Clearance (by C-G formula based on SCr of 1.22 mg/dL) Male: 34.8 mL/min Male: 42.5 mL/min   Medical History: Past Medical History:  Diagnosis Date  . BPH (benign prostatic hyperplasia)   . Chronic kidney disease   . DVT (deep venous thrombosis) ([place]) 2014   right leg, IVC filter  . Dysrhythmia 2019   Paroxysmal atrial fibrillation  . Hypertension     Medications:  Warfarin 5 mg daily prior to admission per med rec  Per [facility] records: warfarin 5 mg alternating with 7.5 mg every other day  Last patient reported dose 5/21 at 1800  Assessment: Patient is an 87 y/o M with medical history as above including Afib / DVT s/p IVC filter on warfarin who presented with generalized weakness / falls. Subsequently found to be positive for COVID-19 infection. 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   Pharmacy has been consulted for management of warfarin while patient is in-house.   Suspect INR was low given patient was holding warfarin for EGD on 03/06/21 and likely just resumed within last 1-2 days  Baseline CBC with Hgb 12.3, Plt 187 Albumin: 4, other LFTs WNL DDIs: Dexamethasone  Date INR Plan  5/22 1.2 warfarin 7.5 mg  5/23 1.3 Warfarin 7.5mg   5/24 1.6 5 mg  5/25 2.0 7.5  5/26 2.0 5mg   5/27 2.2 7.5 mg  5/28 2.5         Goal of Therapy:  INR 2-3   Plan:  --INR 2.5 therapeutic. Will continue alternating home schedule of Warfarin 7.5mg  and warfarin 5 mg  -- Will continue to monitor  INR daily. It has been noted that elevated INRs have been seen in Covid+ patients-monitor. --CBC at least every 3 days per protocol  Noralee Space, PharmD Clinical Pharmacist 03/14/2021 10:50 AM

## 2021-03-14 NOTE — Progress Notes (Signed)
Occupational Therapy Treatment Patient Details Name: Alexander Lane MRN: [id number] DOB: 1930-10-15 Today's Date: 03/14/2021    History of present illness Pt is admitted for covid with complaints of weakness and falls. HIstory includes HTN, HLD, GERD, falls and cough. Per chart, multiple falls at home prior to admission.   OT comments  Pt seen for OT tx this date to f/u re: safety with ADLs/ADL mobility. Pt sitting in chair with chair alarm when OT presents and requests to get back to bed. OT engages pt in seated oral care with SETUP and cues to sequence as well as MOD/MAX A arm in arm SPS from chair to EOB and pt requires MIN A for sit to sup. Pt left in bed with alarm set and all needs met and in reach. RN aware of session contents. Will continue to follow acutely.    Follow Up Recommendations  SNF    Equipment Recommendations  3 in 1 bedside commode    Recommendations for Other Services      Precautions / Restrictions Precautions Precautions: Fall Restrictions Weight Bearing Restrictions: No       Mobility Bed Mobility Overal bed mobility: Needs Assistance Bed Mobility: Sit to Supine       Sit to supine: Min assist   General bed mobility comments: MIN A to assist LEs back to bed    Transfers Overall transfer level: Needs assistance Equipment used: 1 person hand held assist Transfers: Stand Pivot Transfers   Stand pivot transfers: Mod assist;Max assist       General transfer comment: arm in arm technique, MOD/MAX A to SPS from recliner back to bed (possibly needed increased assist d/t lower surface than from elevated EOB to transfer wtih PT yesterday).    Balance Overall balance assessment: Needs assistance;History of Falls Sitting-balance support: Bilateral upper extremity supported Sitting balance-Leahy Scale: Fair     Standing balance support: Bilateral upper extremity supported;During functional activity Standing balance-Leahy Scale: Poor Standing  balance comment: requires MOD A to SPS with b/l UE Support                           ADL either performed or assessed with clinical judgement   ADL Overall ADL's : Needs assistance/impaired     Grooming: Set up;Sitting;Oral care Grooming Details (indicate cue type and reason): supported sitting in chair with cues to sequence                                     Vision Patient Visual Report: No change from baseline     Perception     Praxis      Cognition Arousal/Alertness: Awake/alert Behavior During Therapy: WFL for tasks assessed/performed Overall Cognitive Status: Within Functional Limits for tasks assessed                                 General Comments: slight confusion intermittently, but able to follow all simple commands        Exercises Other Exercises Other Exercises: OT engages pt in seated oral care with SETUP and cues to sequence as well as MOD/MAX A arm in arm SPS from chair to EOB and pt requires MIN A for sit to sup.   Shoulder Instructions       General Comments  Pertinent Vitals/ Pain       Pain Assessment: No/denies pain  Home Living                            Prior Functioning/Environment              Frequency  Min 2X/week        Progress Toward Goals  OT Goals(current goals can now be found in the care plan section)  Progress towards OT goals: Progressing toward goals  Acute Rehab OT Goals Patient Stated Goal: to go home OT Goal Formulation: With patient Time For Goal Achievement: 03/23/21 Potential to Achieve Goals: Good  Plan Discharge plan remains appropriate    Co-evaluation                 AM-PAC OT "6 Clicks" Daily Activity     Outcome Measure   Help from another person eating meals?: A Little Help from another person taking care of personal grooming?: A Little Help from another person toileting, which includes using toliet, bedpan, or urinal?: A  Lot Help from another person bathing (including washing, rinsing, drying)?: A Lot Help from another person to put on and taking off regular upper body clothing?: A Little Help from another person to put on and taking off regular lower body clothing?: A Lot 6 Click Score: 15    End of Session Equipment Utilized During Treatment: Gait belt  OT Visit Diagnosis: Other abnormalities of gait and mobility (R26.89);Muscle weakness (generalized) (M62.81)   Activity Tolerance Patient tolerated treatment well   Patient Left in bed;with bed alarm set;with call bell/phone within reach   Nurse Communication Mobility status        Time: 1848-5927 OT Time Calculation (min): 12 min  Charges: OT General Charges $OT Visit: 1 Visit OT Treatments $Therapeutic Activity: 8-22 mins  Gerrianne Scale, [place], OTR/L ascom [phone] 03/14/21, 4:08 PM

## 2021-03-15 DIAGNOSIS — U071 COVID-19: Secondary | ICD-10-CM | POA: Diagnosis not present

## 2021-03-15 LAB — PROTIME-INR
INR: 3.3 — ABNORMAL HIGH (ref 0.8–1.2)
Prothrombin Time: 33.4 seconds — ABNORMAL HIGH (ref 11.4–15.2)

## 2021-03-15 MED ORDER — AMLODIPINE BESYLATE 5 MG PO TABS
2.5000 mg | ORAL_TABLET | Freq: Every day | ORAL | Status: DC
  Administered 2021-03-16 – 2021-03-18 (×3): 2.5 mg via ORAL
  Filled 2021-03-15 (×3): qty 1

## 2021-03-15 MED ORDER — BENZONATATE 100 MG PO CAPS
200.0000 mg | ORAL_CAPSULE | Freq: Two times a day (BID) | ORAL | Status: DC | PRN
  Administered 2021-03-16: 200 mg via ORAL
  Filled 2021-03-15: qty 2

## 2021-03-15 MED ORDER — WARFARIN SODIUM 4 MG PO TABS
4.0000 mg | ORAL_TABLET | Freq: Once | ORAL | Status: AC
  Administered 2021-03-15: 18:00:00 4 mg via ORAL
  Filled 2021-03-15: qty 1

## 2021-03-15 NOTE — Progress Notes (Signed)
PROGRESS NOTE    Alexander Lane  TXH:[id number] DOB: [birth year] DOA: 03/08/2021 PCP: Rusty Aus, MD    Brief Narrative:  Alexander Lane is a 87 y.o. adult with PMH significant for HTN, HLD, DVT, A. fib on Coumadin, CKD 3A, GERD, BPH. Patient presented to the ED on 5/22 with complaint of generalized weakness, fall, cough, confusion, progressively worsening for the last several days. Patient had received all 3 doses of vaccine against COVID-19.  In the ED, blood pressure mostly elevated up to 206/67, heart rate 94 COVID PCR positive. Chest x-ray negative. CT head showed an anterior left hemisphere mixed density mass measuring at least 5 cm diameter with associated vasogenic edema, regional midline shift. Although a progressive left frontal Meningioma is possible, hypodensity and expansion of the anterior corpus callosum is suspicious for an infiltrative malignancy. MRI brain was obtained which showed a large enhancing mass in the left frontal lobe, extra-axial and consistent with meningioma with extensive mass-effect and edema in the left frontal lobe with 11 mm subfalcine herniation. Admitted to hospitalist service. Neurosurgery consulted.  5/29-denies sob, cough, cp. Lying in bed. He reports hes worried b/c his wife also tested covid + now.  Consultants:   Palliative care  Procedures:   Antimicrobials:    remdesivir   Subjective: No abd pain, sob, cp, diarrhea   Objective: Vitals:   03/14/21 1733 03/14/21 1954 03/15/21 0004 03/15/21 0431  BP: (!) 149/55 (!) 152/72 130/73 (!) 128/91  Pulse: 85 71 69 69  Resp: _0 Temp: 98.7 F (37.1 C) 98.3 F (36.8 C) 98.1 F (36.7 C) 97.7 F (36.5 C)  TempSrc:   Oral Oral  SpO2: 97% 95% 96% 97%  Weight:      Height:        Intake/Output Summary (Last 24 hours) at 03/15/2021 0829 Last data filed at 03/15/2021 0431 Gross per 24 hour  Intake 150 ml  Output 675 ml  Net -525 ml   Filed Weights   03/08/21  0529  Weight: 83.9 kg    Examination: NAD, calm Mild end expiratory wheezing very minimal, no rhonchi's Regular S1-S2 no gallops Soft benign positive bowel No edema Awake, and alert. Grossly intact   Data Reviewed: I have personally reviewed following labs and imaging studies  CBC: Recent Labs  Lab 03/09/21 0425 03/10/21 0533 03/11/21 0533 03/12/21 0555 03/13/21 0551  WBC 5.4 11.1* 9.4 7.9 9.0  NEUTROABS 4.3 9.3* 7.8* 6.4 7.6  HGB 12.2* 13.9 12.1* 12.8* 12.8*  HCT 36.3* 39.6 35.0* 37.2* 36.4*  MCV 88.5 86.7 86.4 88.4 86.1  PLT 184 228 217 208 202   Basic Metabolic Panel: Recent Labs  Lab 03/09/21 0425 03/10/21 0533 03/11/21 0533 03/12/21 0555 03/13/21 0551  NA 142 138 136 138 138  K 4.1 4.0 4.0 4.5 4.2  CL 107 101 102 101 103  CO2 _1 GLUCOSE 122* 118* 106* 123* 130*  BUN 27* 34* 39* 40* 48*  CREATININE 1.04 1.15 0.96 1.30* 1.22  CALCIUM 8.6* 8.9 8.3* 8.9 8.6*   GFR: Estimated Creatinine Clearance (by C-G formula based on SCr of 1.22 mg/dL) Male: 34.8 mL/min Male: 42.5 mL/min Liver Function Tests: Recent Labs  Lab 03/09/21 0425 03/10/21 0533 03/11/21 0533 03/12/21 0555 03/13/21 0551  AST 34 42* _2 ALT _3 ALKPHOS 46 47 36* 41 39  BILITOT 0.7 0.6 0.4 0.7 0.6  PROT 5.8* 6.0* 5.3* 5.6*  5.3*  ALBUMIN 3.4* 3.7 3.2* 3.4* 3.3*   No results for input(s): LIPASE, AMYLASE in the last 168 hours. No results for input(s): AMMONIA in the last 168 hours. Coagulation Profile: Recent Labs  Lab 03/11/21 0533 03/12/21 0555 03/13/21 0551 03/14/21 0543 03/15/21 0512  INR 2.0* 2.0* 2.2* 2.5* 3.3*   Cardiac Enzymes: Recent Labs  Lab 03/09/21 0425  CKTOTAL 589*   BNP (last 3 results) No results for input(s): PROBNP in the last 8760 hours. HbA1C: No results for input(s): HGBA1C in the last 72 hours. CBG: No results for input(s): GLUCAP in the last 168 hours. Lipid Profile: No results for input(s): CHOL, HDL, LDLCALC,  TRIG, CHOLHDL, LDLDIRECT in the last 72 hours. Thyroid Function Tests: No results for input(s): TSH, T4TOTAL, FREET4, T3FREE, THYROIDAB in the last 72 hours. Anemia Panel: No results for input(s): VITAMINB12, FOLATE, FERRITIN, TIBC, IRON, RETICCTPCT in the last 72 hours. Sepsis Labs: Recent Labs  Lab 03/08/21 1332 03/08/21 1656  LATICACIDVEN 1.0 0.8    Recent Results (from the past 240 hour(s))  Resp Panel by RT-PCR (Flu A&B, Covid) Nasopharyngeal Swab     Status: Abnormal   Collection Time: 03/08/21  5:26 AM   Specimen: Nasopharyngeal Swab; Nasopharyngeal(NP) swabs in vial transport medium  Result Value Ref Range Status   SARS Coronavirus 2 by RT PCR POSITIVE (A) NEGATIVE Final    Comment: RESULT CALLED TO, READ BACK BY AND VERIFIED WITH: ARIEL SMITH 03/08/21 0837 SJL (NOTE) SARS-CoV-2 target nucleic acids are DETECTED.  The SARS-CoV-2 RNA is generally detectable in upper respiratory specimens during the acute phase of infection. Positive results are indicative of the presence of the identified virus, but do not rule out bacterial infection or co-infection with other pathogens not detected by the test. Clinical correlation with patient history and other diagnostic information is necessary to determine patient infection status. The expected result is Negative.  Fact Sheet for Patients: EntrepreneurPulse.com.au  Fact Sheet for Healthcare Providers: IncredibleEmployment.be  This test is not yet approved or cleared by the [place] FDA and  has been authorized for detection and/or diagnosis of SARS-CoV-2 by FDA under an Emergency Use Authorization (EUA).  This EUA will remain in effect (meaning this test can be use d) for the duration of  the COVID-19 declaration under Section 564(b)(1) of the Act, 21 U.S.C. section 360bbb-3(b)(1), unless the authorization is terminated or revoked sooner.     Influenza A by PCR NEGATIVE NEGATIVE Final    Influenza B by PCR NEGATIVE NEGATIVE Final    Comment: (NOTE) The Xpert Xpress SARS-CoV-2/FLU/RSV plus assay is intended as an aid in the diagnosis of influenza from Nasopharyngeal swab specimens and should not be used as a sole basis for treatment. Nasal washings and aspirates are unacceptable for Xpert Xpress SARS-CoV-2/FLU/RSV testing.  Fact Sheet for Patients: EntrepreneurPulse.com.au  Fact Sheet for Healthcare Providers: IncredibleEmployment.be  This test is not yet approved or cleared by the [place] FDA and has been authorized for detection and/or diagnosis of SARS-CoV-2 by FDA under an Emergency Use Authorization (EUA). This EUA will remain in effect (meaning this test can be used) for the duration of the COVID-19 declaration under Section 564(b)(1) of the Act, 21 U.S.C. section 360bbb-3(b)(1), unless the authorization is terminated or revoked.  Performed at [facility], [address] [place], [place] [address]          Radiology Studies: [facility] Chest [place] 1 View  Result Date: 03/13/2021 CLINICAL DATA:  Weakness and cough EXAM:  PORTABLE CHEST 1 VIEW COMPARISON:  03/08/2021 FINDINGS: The heart size and mediastinal contours are within normal limits. Aortic atherosclerosis. Both lungs are clear. The visualized skeletal structures are unremarkable. IMPRESSION: No active disease. Electronically Signed   By: Donavan Foil M.D.   On: 03/13/2021 19:07        Scheduled Meds: . amLODipine  5 mg Oral Daily  . dexamethasone  4 mg Oral Q6H  . ipratropium  2 puff Inhalation Q4H  . irbesartan  150 mg Oral Daily  . pantoprazole  40 mg Oral BID  . simvastatin  20 mg Oral q1800  . terazosin  5 mg Oral QHS  . warfarin  7.5 mg Oral QODAY   And  . warfarin  5 mg Oral QODAY  . Warfarin - Pharmacist Dosing Inpatient   Does not apply q1600   Continuous Infusions:   Assessment & Plan:   Principal Problem:   COVID-19 virus  infection Active Problems:   BPH (benign prostatic hyperplasia)   CKD (chronic kidney disease), stage IIIa   DVT (deep venous thrombosis) (HCC)   Hypertension   PAF (paroxysmal atrial fibrillation) (HCC)   Brain mass   Acute metabolic encephalopathy   Hypertensive urgency   Fall at home, initial encounter   GERD (gastroesophageal reflux disease)   HLD (hyperlipidemia)   Sepsis ([place])   Elevated CK   COVID pneumonia Acute respiratory failure with hypoxia  -Presented with cough, shortness of breath, tachypnea. -COVID test: PCR positive on admission -Chest imaging: Chest x-ray negative for infiltrates. 5/29- received Remdesivir Still mildly with episodic wheezing Continue steroid IS  vitc , zn     SIRS - POA -Patient met SIRS criteria on admission because of COVID infection.  5/28 no evidence of bacterial infection  Lactic acid and WBC normal  Sepsis was ruled out      Hypertensive urgency Improved.  Now mildly on the low side. Continue terazosin and Avapro Decrease amlodipine to 2.5 mg daily       Acute metabolic encephalopathy: -Likely multifactorial etiology, including COVID infection, dehydration, elevated blood pressure, intracranial mass. 5/28-MS improved and remains at baseline   Paroxysmal A. Fib Subtherapeutic INR On Coumadin INR mildly supratherapeutic at 3.3 Was given 5 mg of Coumadin spoke to pharmacy they will give him 4 mg tonight Continue to monitor INR    Intracranial mass  -CT-head and MRI brain finding as above showing a large meningioma.   -On admission, patient was started on steroids and Keppra.   -Neurology consultation obtained with Dr. Izora Ribas.  No need of inpatient intervention.  Recommended to stop Keppra and wean off steroids in 5 to 7 days.  He will arrange for outpatient follow-up in couple of weeks.       AKI on CKD 3A Improved with hydration At baseline   BPH (benign prostatic hyperplasia) -Continue  terazosin  DVT (deep venous thrombosis) -Continue Coumadin.  DVT scan of lower extremities negative.    GERD (gastroesophageal reflux disease) -Protonix  HLD (hyperlipidemia) -Zocor   generalized weakness/fall Elevated Ck, trending down, likely from fall.      DVT prophylaxis: coumadin Code Status:full Family Communication: None at bedside  Status is: Inpatient  Remains inpatient appropriate because:Unsafe d/c plan   Dispo: The patient is from: Home              Anticipated d/c is to: SNF              Patient currently is medically stable to d/c.  Difficult to place patient No   Pt has to wait for total 10 days of isolation to be over for SNF, SNF next week on tuesday         LOS: 7 days   Time spent: 35 minutes with more than 50% on [place], MD [facility] Pager [phone]  If 7PM-7AM, please contact night-coverage 03/15/2021, 8:29 AM

## 2021-03-15 NOTE — Consult Note (Signed)
[place] for Warfarin Indication: History of DVT / Afib  Patient Measurements: Height: 5\' 7"  (170.2 cm) Weight: 83.9 kg (185 lb) IBW/kg (Calculated) : 66.1  Labs: Recent Labs    03/13/21 0551 03/14/21 0543 03/15/21 0512  HGB 12.8*  --   --   HCT 36.4*  --   --   PLT 221  --   --   LABPROT 24.1* 27.3* 33.4*  INR 2.2* 2.5* 3.3*  CREATININE 1.22  --   --     Estimated Creatinine Clearance (by C-G formula based on SCr of 1.22 mg/dL) Male: 34.8 mL/min Male: 42.5 mL/min   Medical History: Past Medical History:  Diagnosis Date  . BPH (benign prostatic hyperplasia)   . Chronic kidney disease   . DVT (deep venous thrombosis) ([place]) 2014   right leg, IVC filter  . Dysrhythmia 2019   Paroxysmal atrial fibrillation  . Hypertension     Medications:  Warfarin 5 mg daily prior to admission per med rec  Per [facility] records: warfarin 5 mg alternating with 7.5 mg every other day  Last patient reported dose 5/21 at 1800  Assessment: Patient is an 87 y/o M with medical history as above including Afib / DVT s/p IVC filter on warfarin who presented with generalized weakness / falls. Subsequently found to be positive for COVID-19 infection. 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   Pharmacy has been consulted for management of warfarin while patient is in-house.   Suspect INR was low given patient was holding warfarin for EGD on 03/06/21 and likely just resumed within last 1-2 days  Baseline CBC with Hgb 12.3, Plt 187 Albumin: 4, other LFTs WNL DDIs: Dexamethasone  Date INR Plan  5/22 1.2 warfarin 7.5 mg  5/23 1.3 Warfarin 7.5mg   5/24 1.6 5 mg  5/25 2.0 7.5  5/26 2.0 5mg   5/27 2.2 7.5 mg  5/28 2.5 5 mg  5/29 3.3         Goal of Therapy:  INR 2-3   Plan:  --INR 3.3 supratherapeutic. Will order warfarin 4 mg x 1 tonight. -- Will continue to monitor INR daily. It has  been noted that elevated INRs have been seen in Covid+ patients-monitor. --CBC at least every 3 days per protocol  Noralee Space, PharmD Clinical Pharmacist 03/15/2021 11:04 AM

## 2021-03-16 DIAGNOSIS — U071 COVID-19: Secondary | ICD-10-CM | POA: Diagnosis not present

## 2021-03-16 LAB — PROTIME-INR
INR: 3.4 — ABNORMAL HIGH (ref 0.8–1.2)
Prothrombin Time: 34 seconds — ABNORMAL HIGH (ref 11.4–15.2)

## 2021-03-16 MED ORDER — WARFARIN SODIUM 2 MG PO TABS
2.0000 mg | ORAL_TABLET | Freq: Once | ORAL | Status: AC
  Administered 2021-03-16: 2 mg via ORAL
  Filled 2021-03-16: qty 1

## 2021-03-16 NOTE — Progress Notes (Addendum)
PROGRESS NOTE    Alexander Lane  DPO:[id number] DOB: 11-Jul-1930 DOA: 03/08/2021 PCP: Rusty Aus, MD    Brief Narrative:  Alexander Lane is a 87 y.o. adult with PMH significant for HTN, HLD, DVT, A. fib on Coumadin, CKD 3A, GERD, BPH. Patient presented to the ED on 5/22 with complaint of generalized weakness, fall, cough, confusion, progressively worsening for the last several days. Patient had received all 3 doses of vaccine against COVID-19.  In the ED, blood pressure mostly elevated up to 206/67, heart rate 94 COVID PCR positive. Chest x-ray negative. CT head showed an anterior left hemisphere mixed density mass measuring at least 5 cm diameter with associated vasogenic edema, regional midline shift. Although a progressive left frontal Meningioma is possible, hypodensity and expansion of the anterior corpus callosum is suspicious for an infiltrative malignancy. MRI brain was obtained which showed a large enhancing mass in the left frontal lobe, extra-axial and consistent with meningioma with extensive mass-effect and edema in the left frontal lobe with 11 mm subfalcine herniation. Admitted to hospitalist service. Neurosurgery consulted.  5/29-denies sob, cough, cp. Lying in bed. He reports hes worried b/c his wife also tested covid + now. 5/20-no sob, cp, or abd pain  Consultants:   Palliative care  Procedures:   Antimicrobials:    remdesivir   Subjective: No complaints today. Ate breakfast    Objective: Vitals:   03/15/21 2002 03/15/21 2306 03/16/21 0359 03/16/21 0803  BP: (!) 120/48 135/64 140/64 136/64  Pulse: 71 (!) 58 64 67  Resp: _0 Temp: 99.4 F (37.4 C) 98 F (36.7 C) 97.7 F (36.5 C) 98.6 F (37 C)  TempSrc: Oral  Oral   SpO2: 94% 96% 95% 94%  Weight:      Height:        Intake/Output Summary (Last 24 hours) at 03/16/2021 0817 Last data filed at 03/16/2021 0416 Gross per 24 hour  Intake 270 ml  Output 700 ml  Net -430 ml    Filed Weights   03/08/21 0529  Weight: 83.9 kg    Examination: NAD, calm, pleasant CTA no wheeze rales rhonchi's Regular S1-S2 no gallops Soft benign positive bowel sounds No edema Awake and alert, grossly intact Mood and affect appropriate in current setting  Data Reviewed: I have personally reviewed following labs and imaging studies  CBC: Recent Labs  Lab 03/10/21 0533 03/11/21 0533 03/12/21 0555 03/13/21 0551  WBC 11.1* 9.4 7.9 9.0  NEUTROABS 9.3* 7.8* 6.4 7.6  HGB 13.9 12.1* 12.8* 12.8*  HCT 39.6 35.0* 37.2* 36.4*  MCV 86.7 86.4 88.4 86.1  PLT 228 217 208 431   Basic Metabolic Panel: Recent Labs  Lab 03/10/21 0533 03/11/21 0533 03/12/21 0555 03/13/21 0551  NA 138 136 138 138  K 4.0 4.0 4.5 4.2  CL 101 102 101 103  CO2 _1 GLUCOSE 118* 106* 123* 130*  BUN 34* 39* 40* 48*  CREATININE 1.15 0.96 1.30* 1.22  CALCIUM 8.9 8.3* 8.9 8.6*   GFR: Estimated Creatinine Clearance (by C-G formula based on SCr of 1.22 mg/dL) Male: 34.8 mL/min Male: 42.5 mL/min Liver Function Tests: Recent Labs  Lab 03/10/21 0533 03/11/21 0533 03/12/21 0555 03/13/21 0551  AST 42* _2 ALT _3 ALKPHOS 47 36* 41 39  BILITOT 0.6 0.4 0.7 0.6  PROT 6.0* 5.3* 5.6* 5.3*  ALBUMIN 3.7 3.2* 3.4* 3.3*   No results for input(s): LIPASE, AMYLASE  in the last 168 hours. No results for input(s): AMMONIA in the last 168 hours. Coagulation Profile: Recent Labs  Lab 03/12/21 0555 03/13/21 0551 03/14/21 0543 03/15/21 0512 03/16/21 0420  INR 2.0* 2.2* 2.5* 3.3* 3.4*   Cardiac Enzymes: No results for input(s): CKTOTAL, CKMB, CKMBINDEX, TROPONINI in the last 168 hours. BNP (last 3 results) No results for input(s): PROBNP in the last 8760 hours. HbA1C: No results for input(s): HGBA1C in the last 72 hours. CBG: No results for input(s): GLUCAP in the last 168 hours. Lipid Profile: No results for input(s): CHOL, HDL, LDLCALC, TRIG, CHOLHDL, LDLDIRECT in the  last 72 hours. Thyroid Function Tests: No results for input(s): TSH, T4TOTAL, FREET4, T3FREE, THYROIDAB in the last 72 hours. Anemia Panel: No results for input(s): VITAMINB12, FOLATE, FERRITIN, TIBC, IRON, RETICCTPCT in the last 72 hours. Sepsis Labs: No results for input(s): PROCALCITON, LATICACIDVEN in the last 168 hours.  Recent Results (from the past 240 hour(s))  Resp Panel by RT-PCR (Flu A&B, Covid) Nasopharyngeal Swab     Status: Abnormal   Collection Time: 03/08/21  5:26 AM   Specimen: Nasopharyngeal Swab; Nasopharyngeal(NP) swabs in vial transport medium  Result Value Ref Range Status   SARS Coronavirus 2 by RT PCR POSITIVE (A) NEGATIVE Final    Comment: RESULT CALLED TO, READ BACK BY AND VERIFIED WITH: ARIEL SMITH 03/08/21 0837 SJL (NOTE) SARS-CoV-2 target nucleic acids are DETECTED.  The SARS-CoV-2 RNA is generally detectable in upper respiratory specimens during the acute phase of infection. Positive results are indicative of the presence of the identified virus, but do not rule out bacterial infection or co-infection with other pathogens not detected by the test. Clinical correlation with patient history and other diagnostic information is necessary to determine patient infection status. The expected result is Negative.  Fact Sheet for Patients: https://www.fda.gov/media/152166/download  Fact Sheet for Healthcare Providers: https://www.fda.gov/media/152162/download  This test is not yet approved or cleared by the [place] FDA and  has been authorized for detection and/or diagnosis of SARS-CoV-2 by FDA under an Emergency Use Authorization (EUA).  This EUA will remain in effect (meaning this test can be use d) for the duration of  the COVID-19 declaration under Section 564(b)(1) of the Act, 21 U.S.C. section 360bbb-3(b)(1), unless the authorization is terminated or revoked sooner.     Influenza A by PCR NEGATIVE NEGATIVE Final   Influenza B by PCR NEGATIVE  NEGATIVE Final    Comment: (NOTE) The Xpert Xpress SARS-CoV-2/FLU/RSV plus assay is intended as an aid in the diagnosis of influenza from Nasopharyngeal swab specimens and should not be used as a sole basis for treatment. Nasal washings and aspirates are unacceptable for Xpert Xpress SARS-CoV-2/FLU/RSV testing.  Fact Sheet for Patients: https://www.fda.gov/media/152166/download  Fact Sheet for Healthcare Providers: https://www.fda.gov/media/152162/download  This test is not yet approved or cleared by the [place] FDA and has been authorized for detection and/or diagnosis of SARS-CoV-2 by FDA under an Emergency Use Authorization (EUA). This EUA will remain in effect (meaning this test can be used) for the duration of the COVID-19 declaration under Section 564(b)(1) of the Act, 21 U.S.C. section 360bbb-3(b)(1), unless the authorization is terminated or revoked.  Performed at [facility], [address] [place], [place] [address]          Radiology Studies: No results found.      Scheduled Meds: . amLODipine  2.5 mg Oral Daily  . dexamethasone  4 mg Oral Q6H  . ipratropium  2 puff Inhalation Q4H  .   irbesartan  150 mg Oral Daily  . pantoprazole  40 mg Oral BID  . simvastatin  20 mg Oral q1800  . terazosin  5 mg Oral QHS  . Warfarin - Pharmacist Dosing Inpatient   Does not apply q1600   Continuous Infusions:   Assessment & Plan:   Principal Problem:   COVID-19 virus infection Active Problems:   BPH (benign prostatic hyperplasia)   CKD (chronic kidney disease), stage IIIa   DVT (deep venous thrombosis) (HCC)   Hypertension   PAF (paroxysmal atrial fibrillation) (HCC)   Brain mass   Acute metabolic encephalopathy   Hypertensive urgency   Fall at home, initial encounter   GERD (gastroesophageal reflux disease)   HLD (hyperlipidemia)   Sepsis (HCC)   Elevated CK   COVID pneumonia Acute respiratory failure with hypoxia  -Presented with  cough, shortness of breath, tachypnea. -COVID test: PCR positive on admission -Chest imaging: Chest x-ray negative for infiltrates. 5/30 received remdesivir  Continue steroids  I-S  Vitamin C, zinc  Currently on room air        SIRS - POA -Patient met SIRS criteria on admission because of COVID infection.  5/30 no evidence of bacterial infection  Lactic acid and WBC normal  Sepsis ruled out      Hypertensive urgency Improved  Continue terazosin and Avapro  Continue amlodipine      Acute metabolic encephalopathy: -Likely multifactorial etiology, including COVID infection, dehydration, elevated blood pressure, intracranial mass. 5/30 MS improving remains at baseline 5/28-MS improved and remains at baseline   Paroxysmal A. Fib Subtherapeutic INR On Coumadin INR mildly supratherapeutic at 3.4 Will give coumadin 2mg tonight Continue to monitor inr      Intracranial mass  -CT-head and MRI brain finding as above showing a large meningioma.   -On admission, patient was started on steroids and Keppra.   -Neurology consultation obtained with Dr. Yarbrough.  No need of inpatient intervention.  Recommended to stop Keppra and wean off steroids in 5 to 7 days.  He will arrange for outpatient follow-up in couple of weeks.       AKI on CKD 3A Improved with hydration At baseline   BPH (benign prostatic hyperplasia) -Continue terazosin  DVT (deep venous thrombosis) -Continue Coumadin.  DVT scan of lower extremities negative.    GERD (gastroesophageal reflux disease) -Protonix  HLD (hyperlipidemia) -Zocor   generalized weakness/fall Elevated Ck, trending down, likely from fall.      DVT prophylaxis: coumadin Code Status:full Family Communication: None at bedside  Status is: Inpatient  Remains inpatient appropriate because:Unsafe d/c plan   Dispo: The patient is from: Home              Anticipated d/c is to: SNF              Patient  currently is medically stable to d/c.   Difficult to place patient No   Pt has to wait for total 10 days of isolation to be over for SNF, SNF likely in am          LOS: 8 days   Time spent: 35 minutes with more than 50% on COC    Sahar Amery, MD [facility] Pager [phone]  If 7PM-7AM, please contact night-coverage 03/16/2021, 8:17 AM 

## 2021-03-16 NOTE — Evaluation (Signed)
Clinical/Bedside Swallow Evaluation Patient Details  Name: Alexander Lane MRN: [id number] Date of Birth: 06-26-1930  Today's Date: 03/16/2021 Time: SLP Start Time (ACUTE ONLY): 0915 SLP Stop Time (ACUTE ONLY): 1015 SLP Time Calculation (min) (ACUTE ONLY): 60 min  Past Medical History:  Past Medical History:  Diagnosis Date  . BPH (benign prostatic hyperplasia)   . Chronic kidney disease   . DVT (deep venous thrombosis) ([place]) 2014   right leg, IVC filter  . Dysrhythmia 2019   Paroxysmal atrial fibrillation  . Hypertension    Past Surgical History:  Past Surgical History:  Procedure Laterality Date  . ESOPHAGOGASTRODUODENOSCOPY (EGD) WITH PROPOFOL N/A 03/06/2021   Procedure: ESOPHAGOGASTRODUODENOSCOPY (EGD) WITH PROPOFOL;  Surgeon: [name], MD;  Location: [facility] ENDOSCOPY;  Service: Endoscopy;  Laterality: N/A;  . TONSILLECTOMY     HPI:  Pt Pt is admitted for Covid with complaints of weakness and falls. HIstory includes HTN, HLD, GERD, falls and cough. Per chart, multiple falls at home prior to admission. COVID PCR positive.  Chest x-ray negative.  CT head showed an anterior left hemisphere mixed density mass measuring at least 5 cm diameter with associated vasogenic edema, regional midline shift. Although a progressive left frontal Meningioma is possible, hypodensity and expansion of the anterior corpus callosum is suspicious for an infiltrative malignancy.  MRI brain was obtained which showed a large enhancing mass in the left frontal lobe, extra-axial and consistent with meningioma with extensive mass-effect and edema in the left frontal lobe with 11 mm subfalcine herniation.  Lives at home w/ Wife.  Unsure of pt's baseline Cognitive status. Ambulatory with a cane.  He was able to perform all ADLs per Wife's report.   Assessment / Plan / Recommendation Clinical Impression  Pt appears to present w/ adequate oropharyngeal phase swallow w/ No immediate, overt oropharyngeal  phase dysphagia noted, No neuromuscular deficits noted. Pt consumed po trials w/ No immediate, overt clinical s/s of aspiration during po trials. Pt appears at reduced risk for aspiration following general aspiration precautions.   During po trials, pt consumed all consistencies w/ no overt coughing, decline in vocal quality, or change in respiratory presentation during/post trials. A mild throat clearing occurred x1 b/t trials but not immediate to the trial at hand. This did not increase in frequency or intensity. Oral phase appeared [facility] w/ timely bolus management, mastication, and control of bolus propulsion for A-P transfer for swallowing. Oral clearing achieved w/ all trial consistencies. OM Exam appeared [facility] w/ no unilateral weakness noted. Speech Clear. Pt fed self w/ setup support. Both top/lower denture plate in place but bottom plate slighlty loose.   Recommend a more mech soft consistency diet w/ well-Cut meats, moistened foods for ease of intake; Thin liquids VIA CUP - pt does not use straws at home. Recommend general aspiration precautions, Pills WHOLE in Puree for safer, easier swallowing as pt described Larger pills causing difficulty to swallow. Education given on Pills in Puree; food consistencies and easy to eat options; general aspiration precautions. NSG to reconsult if any new needs arise. NSG agreed. Recommend continued f/u w/ Palliative Care for [place]. SLP Visit Diagnosis: Dysphagia, unspecified (R13.10)    Aspiration Risk   (reduced following general precautions)    Diet Recommendation   mech soft consistency diet w/ well-Cut meats, moistened foods for ease of intake; Thin liquids VIA CUP - pt does not use straws at home. Recommend general aspiration precautions  Medication Administration: Whole meds with puree (for safer swallowing)  Other  Recommendations Recommended Consults:  (Palliative Care following; Dietician) Oral Care Recommendations: Oral care BID;Staff/trained  caregiver to provide oral care;Oral care before and after PO (Denture care) Other Recommendations:  (n/a)   Follow up Recommendations None      Frequency and Duration min 1 x/week  1 week       Prognosis Prognosis for Safe Diet Advancement: Fair (-Good)      Swallow Study   General Date of Onset: 03/08/21 HPI: Pt Pt is admitted for Covid with complaints of weakness and falls. HIstory includes HTN, HLD, GERD, falls and cough. Per chart, multiple falls at home prior to admission. COVID PCR positive.  Chest x-ray negative.  CT head showed an anterior left hemisphere mixed density mass measuring at least 5 cm diameter with associated vasogenic edema, regional midline shift. Although a progressive left frontal Meningioma is possible, hypodensity and expansion of the anterior corpus callosum is suspicious for an infiltrative malignancy.  MRI brain was obtained which showed a large enhancing mass in the left frontal lobe, extra-axial and consistent with meningioma with extensive mass-effect and edema in the left frontal lobe with 11 mm subfalcine herniation.  Lives at home w/ Wife.  Unsure of pt's baseline Cognitive status. Ambulatory with a cane.  He was able to perform all ADLs per Wife's report. Type of Study: Bedside Swallow Evaluation Previous Swallow Assessment: none Diet Prior to this Study: Regular;Thin liquids Temperature Spikes Noted: No (wbc 9.0) Respiratory Status: Room air History of Recent Intubation: No Behavior/Cognition: Alert;Cooperative;Pleasant mood;Requires cueing (min cues) Oral Cavity Assessment: Within Functional Limits Oral Care Completed by SLP: Yes Oral Cavity - Dentition: Dentures, top;Dentures, bottom Vision: Functional for self-feeding Self-Feeding Abilities: Able to feed self;Needs set up Patient Positioning: Upright in bed (needed min support sitting up in bed) Baseline Vocal Quality: Normal Volitional Cough: Strong;Congested (min) Volitional Swallow: Able to  elicit    Oral/Motor/Sensory Function Overall Oral Motor/Sensory Function: Within functional limits   Ice Chips Ice chips: Within functional limits Presentation: Spoon (fed; 4 trials)   Thin Liquid Thin Liquid: Within functional limits Presentation: Cup;Self Fed (15+ trials)    Nectar Thick Nectar Thick Liquid: Not tested   Honey Thick Honey Thick Liquid: Not tested   Puree Puree: Within functional limits Presentation: Self Fed;Spoon (~3 ozs)   Solid     Solid: Within functional limits Presentation: Self Fed (5-6 trials)        Orinda Kenner, MS, CCC-SLP Speech Language Pathologist Rehab Services [phone] Batina Dougan 03/16/2021,2:51 PM

## 2021-03-16 NOTE — Consult Note (Addendum)
[place] for Warfarin Indication: History of DVT / Afib  Patient Measurements: Height: 5\' 7"  (170.2 cm) Weight: 83.9 kg (185 lb) IBW/kg (Calculated) : 66.1  Labs: Recent Labs    03/14/21 0543 03/15/21 0512 03/16/21 0420  LABPROT 27.3* 33.4* 34.0*  INR 2.5* 3.3* 3.4*    Estimated Creatinine Clearance (by C-G formula based on SCr of 1.22 mg/dL) Male: 34.8 mL/min Male: 42.5 mL/min   Medical History: Past Medical History:  Diagnosis Date  . BPH (benign prostatic hyperplasia)   . Chronic kidney disease   . DVT (deep venous thrombosis) ([place]) 2014   right leg, IVC filter  . Dysrhythmia 2019   Paroxysmal atrial fibrillation  . Hypertension     Medications:  Warfarin 5 mg daily prior to admission per med rec  Per [facility] records: warfarin 5 mg alternating with 7.5 mg every other day  Last patient reported dose 5/21 at 1800  Assessment: Patient is an 87 y/o M with medical history as above including Afib / DVT s/p IVC filter on warfarin who presented with generalized weakness / falls. Subsequently found to be positive for COVID-19 infection. 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   Pharmacy has been consulted for management of warfarin while patient is in-house.   Suspect INR was low given patient was holding warfarin for EGD on 03/06/21 and likely just resumed within pervious 1-2 days  DDIs: dexamethasone  Date INR Warfarin Plan  5/22 1.2  7.5 mg  5/23 1.3 7.5mg   5/24 1.6 5 mg  5/25 2.0 7.5  5/26 2.0 5mg   5/27 2.2 7.5 mg  5/28 2.5 5 mg  5/29 3.3 4 mg  5/30 3.4 2 mg    Goal of Therapy:  INR 2-3   Plan:   INR supratherapeutic: 2 mg warfarin today at the request of the attending physician  continue to monitor INR daily. It has been noted that elevated INRs have been seen in Covid+ patients: monitor  CBC at least every 3 days per protocol  Dallie Piles,  PharmD Clinical Pharmacist 03/16/2021 10:21 AM

## 2021-03-17 DIAGNOSIS — U071 COVID-19: Secondary | ICD-10-CM | POA: Diagnosis not present

## 2021-03-17 LAB — CBC
HCT: 38.2 % — ABNORMAL LOW (ref 39.0–52.0)
Hemoglobin: 13 g/dL (ref 13.0–17.0)
MCH: 29.3 pg (ref 26.0–34.0)
MCHC: 34 g/dL (ref 30.0–36.0)
MCV: 86 fL (ref 80.0–100.0)
Platelets: 263 10*3/uL (ref 150–400)
RBC: 4.44 MIL/uL (ref 4.22–5.81)
RDW: 12.4 % (ref 11.5–15.5)
WBC: 10.4 10*3/uL (ref 4.0–10.5)
nRBC: 0 % (ref 0.0–0.2)

## 2021-03-17 LAB — PROTIME-INR
INR: 3.1 — ABNORMAL HIGH (ref 0.8–1.2)
Prothrombin Time: 32.1 seconds — ABNORMAL HIGH (ref 11.4–15.2)

## 2021-03-17 MED ORDER — WARFARIN SODIUM 2 MG PO TABS
2.0000 mg | ORAL_TABLET | Freq: Once | ORAL | Status: AC
  Administered 2021-03-17: 2 mg via ORAL
  Filled 2021-03-17 (×2): qty 1

## 2021-03-17 NOTE — Progress Notes (Signed)
PROGRESS NOTE    Alexander Lane  PET:[id number] DOB: 04/05/1930 DOA: 03/08/2021 PCP: Rusty Aus, MD    Brief Narrative:  Alexander Lane is a 87 y.o. adult with PMH significant for HTN, HLD, DVT, A. fib on Coumadin, CKD 3A, GERD, BPH. Patient presented to the ED on 5/22 with complaint of generalized weakness, fall, cough, confusion, progressively worsening for several days. Patient had received all 3 doses of vaccine against COVID-19. CT head showed an anterior left hemisphere mixed density mass measuring at least 5 cm diameter with associated vasogenic edema, regional midline shift. Although a progressive left frontal Meningioma is possible, hypodensity and expansion of the anterior corpus callosum is suspicious for an infiltrative malignancy.  MRI brain was obtained which showed a large enhancing mass in the left frontal lobe, extra-axial and consistent with meningioma with extensive mass-effect and edema in the left frontal lobe with 11 mm subfalcine herniation.   He was admitted for management of his COVID-pneumonia. And neurosurgery was consulted.  He was started on IV remdesivir which he completed and steroids.  He did have acute metabolic encephalopathy which improved and he is at baseline.  Dr. Lupita Dawn week: No issues, just waiting for total 10 days of isolation to be over for SNF. Today is the last day. However patient refusing today to go to snf. He is unsafe to go home. He is on blood thinners. Spoke to his wife, family will come by and see him and talk to him about going to SNF.      5/29-denies sob, cough, cp. Lying in bed. He reports hes worried b/c his wife also tested covid + now. 5/20-no sob, cp, or abd pain  Consultants:   Palliative care  Procedures:   Antimicrobials:    remdesivir-completed   Subjective: No sob,cp, abd pain.    Objective: Vitals:   03/17/21 0045 03/17/21 0409 03/17/21 0810 03/17/21 1221  BP: (!) 125/44 (!) 150/70 (!) 166/66  (!) 151/65  Pulse: 67 67 84 82  Resp: 15 18 17 20   Temp: 98.6 F (37 C) 98 F (36.7 C) 98.3 F (36.8 C) 98.1 F (36.7 C)  TempSrc: Oral  Oral   SpO2: 96% 96% 95% 94%  Weight:      Height:        Intake/Output Summary (Last 24 hours) at 03/17/2021 1247 Last data filed at 03/17/2021 1222 Gross per 24 hour  Intake 218 ml  Output 1050 ml  Net -832 ml   Filed Weights   03/08/21 0529  Weight: 83.9 kg    Examination: Nad, calm cta no w/r/r Regular s1/s2 no gallop Soft benign +bs No edema Aaxox3, grossly intact   Data Reviewed: I have personally reviewed following labs and imaging studies  CBC: Recent Labs  Lab 03/11/21 0533 03/12/21 0555 03/13/21 0551 03/17/21 0456  WBC 9.4 7.9 9.0 10.4  NEUTROABS 7.8* 6.4 7.6  --   HGB 12.1* 12.8* 12.8* 13.0  HCT 35.0* 37.2* 36.4* 38.2*  MCV 86.4 88.4 86.1 86.0  PLT 217 208 221 225   Basic Metabolic Panel: Recent Labs  Lab 03/11/21 0533 03/12/21 0555 03/13/21 0551  NA 136 138 138  K 4.0 4.5 4.2  CL 102 101 103  CO2 27 29 28   GLUCOSE 106* 123* 130*  BUN 39* 40* 48*  CREATININE 0.96 1.30* 1.22  CALCIUM 8.3* 8.9 8.6*   GFR: Estimated Creatinine Clearance (by C-G formula based on SCr of 1.22 mg/dL) Male: 34.8 mL/min Male: 42.5 mL/min  Liver Function Tests: Recent Labs  Lab 03/11/21 0533 03/12/21 0555 03/13/21 0551  AST 28 23 18   ALT 17 19 18   ALKPHOS 36* 41 39  BILITOT 0.4 0.7 0.6  PROT 5.3* 5.6* 5.3*  ALBUMIN 3.2* 3.4* 3.3*   No results for input(s): LIPASE, AMYLASE in the last 168 hours. No results for input(s): AMMONIA in the last 168 hours. Coagulation Profile: Recent Labs  Lab 03/13/21 0551 03/14/21 0543 03/15/21 0512 03/16/21 0420 03/17/21 0456  INR 2.2* 2.5* 3.3* 3.4* 3.1*   Cardiac Enzymes: No results for input(s): CKTOTAL, CKMB, CKMBINDEX, TROPONINI in the last 168 hours. BNP (last 3 results) No results for input(s): PROBNP in the last 8760 hours. HbA1C: No results for input(s): HGBA1C  in the last 72 hours. CBG: No results for input(s): GLUCAP in the last 168 hours. Lipid Profile: No results for input(s): CHOL, HDL, LDLCALC, TRIG, CHOLHDL, LDLDIRECT in the last 72 hours. Thyroid Function Tests: No results for input(s): TSH, T4TOTAL, FREET4, T3FREE, THYROIDAB in the last 72 hours. Anemia Panel: No results for input(s): VITAMINB12, FOLATE, FERRITIN, TIBC, IRON, RETICCTPCT in the last 72 hours. Sepsis Labs: No results for input(s): PROCALCITON, LATICACIDVEN in the last 168 hours.  Recent Results (from the past 240 hour(s))  Resp Panel by RT-PCR (Flu A&B, Covid) Nasopharyngeal Swab     Status: Abnormal   Collection Time: 03/08/21  5:26 AM   Specimen: Nasopharyngeal Swab; Nasopharyngeal(NP) swabs in vial transport medium  Result Value Ref Range Status   SARS Coronavirus 2 by RT PCR POSITIVE (A) NEGATIVE Final    Comment: RESULT CALLED TO, READ BACK BY AND VERIFIED WITH: ARIEL SMITH 03/08/21 0837 [facility] (NOTE) SARS-CoV-2 target nucleic acids are DETECTED.  The SARS-CoV-2 RNA is generally detectable in upper respiratory specimens during the acute phase of infection. Positive results are indicative of the presence of the identified virus, but do not rule out bacterial infection or co-infection with other pathogens not detected by the test. Clinical correlation with patient history and other diagnostic information is necessary to determine patient infection status. The expected result is Negative.  Fact Sheet for Patients: EntrepreneurPulse.com.au  Fact Sheet for Healthcare Providers: IncredibleEmployment.be  This test is not yet approved or cleared by the [place] FDA and  has been authorized for detection and/or diagnosis of SARS-CoV-2 by FDA under an Emergency Use Authorization (EUA).  This EUA will remain in effect (meaning this test can be use d) for the duration of  the COVID-19 declaration under Section 564(b)(1) of the  Act, 21 U.S.C. section 360bbb-3(b)(1), unless the authorization is terminated or revoked sooner.     Influenza A by PCR NEGATIVE NEGATIVE Final   Influenza B by PCR NEGATIVE NEGATIVE Final    Comment: (NOTE) The Xpert Xpress SARS-CoV-2/FLU/RSV plus assay is intended as an aid in the diagnosis of influenza from Nasopharyngeal swab specimens and should not be used as a sole basis for treatment. Nasal washings and aspirates are unacceptable for Xpert Xpress SARS-CoV-2/FLU/RSV testing.  Fact Sheet for Patients: EntrepreneurPulse.com.au  Fact Sheet for Healthcare Providers: IncredibleEmployment.be  This test is not yet approved or cleared by the [place] FDA and has been authorized for detection and/or diagnosis of SARS-CoV-2 by FDA under an Emergency Use Authorization (EUA). This EUA will remain in effect (meaning this test can be used) for the duration of the COVID-19 declaration under Section 564(b)(1) of the Act, 21 U.S.C. section 360bbb-3(b)(1), unless the authorization is terminated or revoked.  Performed at [facility],  [place], [place] [address]          Radiology Studies: No results found.      Scheduled Meds: . amLODipine  2.5 mg Oral Daily  . dexamethasone  4 mg Oral Q6H  . ipratropium  2 puff Inhalation Q4H  . irbesartan  150 mg Oral Daily  . pantoprazole  40 mg Oral BID  . simvastatin  20 mg Oral q1800  . terazosin  5 mg Oral QHS  . warfarin  2 mg Oral ONCE-1600  . Warfarin - Pharmacist Dosing Inpatient   Does not apply q1600   Continuous Infusions:   Assessment & Plan:   Principal Problem:   COVID-19 virus infection Active Problems:   BPH (benign prostatic hyperplasia)   CKD (chronic kidney disease), stage IIIa   DVT (deep venous thrombosis) ([facility])   Hypertension   PAF (paroxysmal atrial fibrillation) ([facility])   Brain mass   Acute metabolic encephalopathy   Hypertensive  urgency   Fall at home, initial encounter   GERD (gastroesophageal reflux disease)   HLD (hyperlipidemia)   Sepsis ([place])   Elevated CK   COVID pneumonia Acute respiratory failure with hypoxia  -Presented with cough, shortness of breath, tachypnea. -COVID test: PCR positive on admission -Chest imaging: Chest x-ray negative for infiltrates. 5/31-patient completed remdesivir  On steroid  On room air  Continue I-S, vitamin C, zinc     SIRS - POA -Patient met SIRS criteria on admission because of COVID infection.  5/31 no evidence of bacterial infection  Lactic acid and WBC normal  Sepsis was ruled out      Hypertensive urgency Overall has improved.  At times he may become elevated. Continue terazosin and Avapro Continue amlodipine     Acute metabolic encephalopathy: -Likely multifactorial etiology, including COVID infection, dehydration, elevated blood pressure, intracranial mass. 5/31 MS improved and continues to be at baseline      Paroxysmal A. Fib Subtherapeutic INR On Coumadin INR mildly supratherapeutic at 3.1 Pharmacy managing dosing     Intracranial mass  -CT-head and MRI brain finding as above showing a large meningioma.   -On admission, patient was started on steroids and Keppra.   -Neurology consultation obtained with Dr. Izora Ribas.  No need of inpatient intervention.  Recommended to stop Keppra and wean off steroids in 5 to 7 days.  He will arrange for outpatient follow-up in couple of weeks.       AKI on CKD 3A Improved with hydration At baseline   BPH (benign prostatic hyperplasia) -Continue terazosin  DVT (deep venous thrombosis) -Continue Coumadin.  DVT scan of lower extremities negative.    GERD (gastroesophageal reflux disease) -Protonix  HLD (hyperlipidemia) -Zocor   generalized weakness/fall Elevated Ck, trending down, likely from fall.      DVT prophylaxis: coumadin Code Status:full Family Communication:  wife updated  Status is: Inpatient  Remains inpatient appropriate because:Unsafe d/c plan   Dispo: The patient is from: Home              Anticipated d/c is to: SNF              Patient currently is medically stable to d/c.   Difficult to place patient No   Pt has to wait for total 10 days of isolation to be over for SNF which will be today.  Pt does not want to go to snf as he tells me today. I spoke to wife, family will come visit today and discuss with  him as it is unsafe for him to go home , especially being on a/c.          LOS: 9 days   Time spent: 35 minutes with more than 50% on [place], MD [facility] Pager [phone]  If 7PM-7AM, please contact night-coverage 03/17/2021, 12:47 PM

## 2021-03-17 NOTE — Care Management Important Message (Signed)
Important Message  Patient Details  Name: Alexander Lane MRN: [id number] Date of Birth: 1930/02/27   Medicare Important Message Given:  Yes  Patient is in an isolation room so I talked with him again by phone [phone]). I reviewed the Important Message from Medicare with him and he stated he wouldn't need the form. I asked him if there was anyone else he wanted me to share this form with and he replied no.  I let him know I hope he feels better soon and thanked him for his time.   Juliann Pulse A Ten[place] Mcclure 03/17/2021, 3:15 PM

## 2021-03-17 NOTE — Progress Notes (Signed)
Physical Therapy Treatment Patient Details Name: Alexander Lane MRN: [id number] DOB: 03/18/1930 Today's Date: 03/17/2021    History of Present Illness Pt is admitted for covid with complaints of weakness and falls. HIstory includes HTN, HLD, GERD, falls and cough. Per chart, multiple falls at home prior to admission.    PT Comments    Patient alert, in bed, oriented x4, but did experience intermittent confusion, but hard to determine from AMS or HOH. Pt did exhibit improvement from previous session, but continued to demonstrate difficulty with balance, mobility, and activity tolerance/endurance. Bed mobility with HOB elevated and CGA. Sit <> stand twice from EOB with RW and minA, difficulty with initial standing balance noted. maxA for pericare due to BM noted. He was able to ambulate ~35ft twice forwards/backwards at EOB with RW and minA due to lateral lean noted, decreased step height/length. Pt fatigued quickly. Returned to supine with all needs in reach. Pt and pt discussed discharge options, pt still resistant to SNF despite education on pt status and safety. Recommendation remains appropriate.      Follow Up Recommendations  SNF     Equipment Recommendations  Rolling walker with 5" wheels    Recommendations for Other Services       Precautions / Restrictions Precautions Precautions: Fall Restrictions Weight Bearing Restrictions: No    Mobility  Bed Mobility Overal bed mobility: Needs Assistance Bed Mobility: Sit to Supine     Supine to sit: Min guard;HOB elevated Sit to supine: Min guard;HOB elevated        Transfers Overall transfer level: Needs assistance Equipment used: Rolling walker (2 wheeled) Transfers: Sit to/from Stand Sit to Stand: Min assist         General transfer comment: performed twice, minA for steadying  Ambulation/Gait Ambulation/Gait assistance: Min assist Gait Distance (Feet):  (24ft, twice) Assistive device: Rolling walker (2  wheeled)   Gait velocity: decreased   General Gait Details: very limited step height/length bilaterally. unsteadiness noted. minA for steadying safety.   Stairs             Wheelchair Mobility    Modified Rankin (Stroke Patients Only)       Balance Overall balance assessment: Needs assistance;History of Falls Sitting-balance support: Bilateral upper extremity supported Sitting balance-Leahy Scale: Fair     Standing balance support: Bilateral upper extremity supported;During functional activity Standing balance-Leahy Scale: Poor Standing balance comment: reliant on UE support                            Cognition Arousal/Alertness: Awake/alert Behavior During Therapy: WFL for tasks assessed/performed Overall Cognitive Status: Within Functional Limits for tasks assessed                                 General Comments: slight confusion intermittently, but able to follow all simple commands      Exercises Other Exercises Other Exercises: Pt educated on continued exercises to maintain strength during hospital admission    General Comments        Pertinent Vitals/Pain Pain Assessment: No/denies pain    Home Living                      Prior Function            PT Goals (current goals can now be found in the care plan section) Progress towards PT  goals: Progressing toward goals    Frequency    Min 2X/week      PT Plan Current plan remains appropriate    Co-evaluation              AM-PAC PT "6 Clicks" Mobility   Outcome Measure  Help needed turning from your back to your side while in a flat bed without using bedrails?: A Little Help needed moving from lying on your back to sitting on the side of a flat bed without using bedrails?: A Lot Help needed moving to and from a bed to a chair (including a wheelchair)?: A Lot Help needed standing up from a chair using your arms (e.g., wheelchair or bedside chair)?:  A Lot Help needed to walk in hospital room?: A Lot Help needed climbing 3-5 steps with a railing? : Total 6 Click Score: 12    End of Session Equipment Utilized During Treatment: Gait belt Activity Tolerance: Patient tolerated treatment well Patient left: in bed;with call bell/phone within reach;with bed alarm set Nurse Communication: Mobility status PT Visit Diagnosis: Unsteadiness on feet (R26.81);Muscle weakness (generalized) (M62.81);Difficulty in walking, not elsewhere classified (R26.2)     Time: 1504-1364 PT Time Calculation (min) (ACUTE ONLY): 25 min  Charges:  $Therapeutic Exercise: 23-37 mins                     Lieutenant Diego PT, DPT 2:55 PM,03/17/21

## 2021-03-17 NOTE — TOC Progression Note (Addendum)
Transition of Care [facility]) - Progression Note    Patient Details  Name: Alexander Lane MRN: [id number] Date of Birth: Jan 05, 1930  Transition of Care [facility]) CM/SW [place], RN Phone Number: 03/17/2021, 2:52 PM  Clinical Narrative: Called and spoke with patient's wife, [name] about discharge to SNF recommendation. Wife states she was given the two choices of [facility] and [facility] and she prefers [facility], however she wants her daughter who is POA to call me to discuss SNF recommendation. I will wait for daughter to return call then start Authorization process.    Daughter returned call, voices understanding and agrees with SNF recommendation, informed of the process for authorization and her role to sign paperwork at the facility prior to admission. Daughter will multiple questions about length of stay and PT schedule, I did advise her that she will be given the answers when paperwork is signed. Daughter states she will call back at 12noon tomorrow to let me know after husband visits today.   Text [facility], Tammy with a bed, Authorization paperwork started.    Expected Discharge Plan: [place] Barriers to Discharge: Continued Medical Work up  Expected Discharge Plan and Services Expected Discharge Plan: [place]   Discharge Planning Services: CM Consult Post Acute Care Choice: [place] Living arrangements for the past 2 months: Single Family Home                 DME Arranged: N/A DME Agency: NA       HH Arranged: NA           Social Determinants of Health (SDOH) Interventions    Readmission Risk Interventions No flowsheet data found.

## 2021-03-17 NOTE — Progress Notes (Signed)
SLP Cancellation Note  Patient Details Name: Alexander Lane MRN: [id number] DOB: Aug 29, 1930   Cancelled treatment:       Reason Eval/Treat Not Completed: SLP screened, no needs identified, will sign off (consulted NSG; reviewed chart notes). NSG reported no problems w/ oral intake nor w/ Pill swallowing -- Whole in puree. Labs and MD reviewed w/ no decline in status.  ST services will sign off at this time w/ NSG to reconsult if any new needs arise while admitted. A more Mech Soft diet w/ Thin liquids would be beneficial for pt at this time during illness for ease of cutting/eating meats; general aspiration precautions.      Orinda Kenner, MS, CCC-SLP Speech Language Pathologist Rehab Services [phone] [facility] 03/17/2021, 3:20 PM

## 2021-03-17 NOTE — Plan of Care (Addendum)
Pt orientedx4, VSS on room air. No c/o pain overnight. Adequate UO, x1 BM this shift. Fall/safety precautions in place, rounding performed, needs/concerns addressed.   Problem: Education: Goal: Knowledge of General Education information will improve Description: Including pain rating scale, medication(s)/side effects and non-pharmacologic comfort measures Outcome: Progressing   Problem: Clinical Measurements: Goal: Ability to maintain clinical measurements within normal limits will improve Outcome: Progressing Goal: Will remain free from infection Outcome: Progressing Goal: Diagnostic test results will improve Outcome: Progressing Goal: Respiratory complications will improve Outcome: Progressing Goal: Cardiovascular complication will be avoided Outcome: Progressing   Problem: Activity: Goal: Risk for activity intolerance will decrease Outcome: Progressing   Problem: Nutrition: Goal: Adequate nutrition will be maintained Outcome: Progressing   Problem: Elimination: Goal: Will not experience complications related to bowel motility Outcome: Progressing Goal: Will not experience complications related to urinary retention Outcome: Progressing   Problem: Pain Managment: Goal: General experience of comfort will improve Outcome: Progressing   Problem: Safety: Goal: Ability to remain free from injury will improve Outcome: Progressing   Problem: Skin Integrity: Goal: Risk for impaired skin integrity will decrease Outcome: Progressing   Problem: Education: Goal: Knowledge of risk factors and measures for prevention of condition will improve Outcome: Progressing   Problem: Coping: Goal: Psychosocial and spiritual needs will be supported Outcome: Progressing   Problem: Respiratory: Goal: Will maintain a patent airway Outcome: Progressing Goal: Complications related to the disease process, condition or treatment will be avoided or minimized Outcome: Progressing

## 2021-03-17 NOTE — Consult Note (Signed)
[place] for Warfarin Indication: History of DVT / Afib  Patient Measurements: Height: 5\' 7"  (170.2 cm) Weight: 83.9 kg (185 lb) IBW/kg (Calculated) : 66.1  Labs: Recent Labs    03/15/21 0512 03/16/21 0420 03/17/21 0456  HGB  --   --  13.0  HCT  --   --  38.2*  PLT  --   --  263  LABPROT 33.4* 34.0* 32.1*  INR 3.3* 3.4* 3.1*    Estimated Creatinine Clearance (by C-G formula based on SCr of 1.22 mg/dL) Male: 34.8 mL/min Male: 42.5 mL/min   Medical History: Past Medical History:  Diagnosis Date  . BPH (benign prostatic hyperplasia)   . Chronic kidney disease   . DVT (deep venous thrombosis) ([place]) 2014   right leg, IVC filter  . Dysrhythmia 2019   Paroxysmal atrial fibrillation  . Hypertension     Medications:  Warfarin 5 mg daily prior to admission per med rec  Per [facility] records: warfarin 5 mg alternating with 7.5 mg every other day  Last patient reported dose 5/21 at 1800  Assessment: Patient is an 87 y/o M with medical history as above including Afib / DVT s/p IVC filter on warfarin who presented with generalized weakness / falls. Subsequently found to be positive for COVID-19 infection. 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   Pharmacy has been consulted for management of warfarin while patient is in-house.   Suspect INR was low given patient was holding warfarin for EGD on 03/06/21 and likely just resumed within pervious 1-2 days  DDIs: dexamethasone  Date INR Warfarin Plan  5/22 1.2  7.5 mg  5/23 1.3 7.5mg   5/24 1.6 5 mg  5/25 2.0 7.5  5/26 2.0 5mg   5/27 2.2 7.5 mg  5/28 2.5 5 mg  5/29 3.3 4 mg  5/30 3.4 2 mg  5/31 3.1         Goal of Therapy:  INR 2-3   Plan:   INR supratherapeutic but trending down:  Will order 2 mg warfarin x 1 again today.  Hgb 13.0  plt 263  continue to monitor INR daily. It has been noted that elevated INRs  have been seen in Covid+ patients: monitor  CBC at least every 3 days per protocol  Noralee Space, PharmD Clinical Pharmacist 03/17/2021 8:17 AM

## 2021-03-18 DIAGNOSIS — J9601 Acute respiratory failure with hypoxia: Secondary | ICD-10-CM | POA: Diagnosis not present

## 2021-03-18 DIAGNOSIS — G9341 Metabolic encephalopathy: Secondary | ICD-10-CM | POA: Diagnosis not present

## 2021-03-18 DIAGNOSIS — D329 Benign neoplasm of meninges, unspecified: Secondary | ICD-10-CM | POA: Diagnosis not present

## 2021-03-18 DIAGNOSIS — U071 COVID-19: Secondary | ICD-10-CM | POA: Diagnosis not present

## 2021-03-18 LAB — PROTIME-INR
INR: 2.6 — ABNORMAL HIGH (ref 0.8–1.2)
Prothrombin Time: 28.1 seconds — ABNORMAL HIGH (ref 11.4–15.2)

## 2021-03-18 MED ORDER — DEXAMETHASONE 4 MG PO TABS: ORAL_TABLET | ORAL | 0 refills | Status: AC

## 2021-03-18 MED ORDER — WARFARIN SODIUM 5 MG PO TABS
5.0000 mg | ORAL_TABLET | Freq: Once | ORAL | Status: AC
  Administered 2021-03-18: 5 mg via ORAL
  Filled 2021-03-18: qty 1

## 2021-03-18 MED ORDER — IPRATROPIUM BROMIDE HFA 17 MCG/ACT IN AERS
2.0000 | INHALATION_SPRAY | RESPIRATORY_TRACT | 12 refills | Status: DC
Start: 2021-03-18 — End: 2021-09-17

## 2021-03-18 NOTE — TOC Transition Note (Signed)
Transition of Care [facility]) - CM/SW Discharge Note   Patient Details  Name: WATARU MCCOWEN MRN: [id number] Date of Birth: [birth year]  Transition of Care [facility]) CM/SW Contact:  Shelbie Hutching, RN Phone Number: 03/18/2021, 4:10 PM   Clinical Narrative:    Patient is medically cleared for discharge to [facility].  Patient's family is aware of discharge today.  [facility] has been arranged for transport.  Bedside RN to call report to [facility].    Final next level of care: Skilled Nursing Facility Barriers to Discharge: Barriers Resolved   Patient Goals and CMS Choice Patient states their goals for this hospitalization and ongoing recovery are:: Patient's wife would like for patient to go to rehab before going home CMS Medicare.gov Compare Post Acute Care list provided to:: Patient Represenative (must comment) Choice offered to / list presented to : [place]  Discharge Placement              Patient chooses bed at: [facility]  Patient to be transferred to facility by: [facility] Name of family member notified: [name] and [name] Patient and family notified of of transfer: 03/18/21  Discharge Plan and Services   Discharge Planning Services: CM Consult Post Acute Care Choice: [place]          DME Arranged: N/A DME Agency: NA       HH Arranged: NA          Social Determinants of Health (SDOH) Interventions     Readmission Risk Interventions No flowsheet data found.

## 2021-03-18 NOTE — Progress Notes (Signed)
Patient is being discharged to [facility].Discharge packet is complete and ready for EMS to transport patient to facility. IV has been removed. I have tried calling facility 3 times to give report but phone is not answered. I will try again until EMS has arrived.

## 2021-03-18 NOTE — Discharge Summary (Signed)
Discharge Summary  Alexander Lane GTX:[id number] DOB: August 22, 1930  PCP: Rusty Aus, MD  Admit date: 03/08/2021 Discharge date: 03/18/2021  Time spent: 25 minutes  Recommendations for Outpatient Follow-up:  1. New medication: Quick prednisone taper 2.    Discharge Diagnoses:  Active Hospital Problems   Diagnosis Date Noted  . Acute respiratory failure with hypoxia ([place]) 03/18/2021  . COVID-19 virus infection 03/08/2021  . Elevated CK 03/08/2021  . BPH (benign prostatic hyperplasia)   . CKD (chronic kidney disease), stage IIIa   . Hypertension   . PAF (paroxysmal atrial fibrillation) ([place])   . Brain mass   . Acute metabolic encephalopathy   . Hypertensive urgency   . Fall at home, initial encounter   . GERD (gastroesophageal reflux disease)   . HLD (hyperlipidemia)   . DVT (deep venous thrombosis) [facility]) 2014    Resolved Hospital Problems  No resolved problems to display.    Discharge Condition: Improved, being discharged to skilled nursing  Diet recommendation: Low-sodium- as per recommendation by speech therapy:More of a Mech Soft consistency diet w/ well-Cut meats, moistened foods for ease of intake; Thin liquids VIA CUP - pt does not use straws at home. Recommend general aspiration precautions, Pills WHOLE in Puree for safer, easier swallowing as pt described Larger pills causing difficulty to swallow.  Vitals:   03/18/21 0720 03/18/21 1101  BP: (!) 160/71 (!) 140/56  Pulse: 72 89  Resp: 18 15  Temp: 98.4 F (36.9 C) 98.1 F (36.7 C)  SpO2: 94% 96%    History of present illness:  87 year old male with past medical history of hypertension, atrial fibrillation on Coumadin, stage III chronic kidney disease and being pH presented to the emergency room on 5/22 with complaints of generalized weakness and fall and confusion.  The emergency room, noted to be positive for COVID-19 despite having been vaccinated plus boosted in the past.  CT scan of head noted anterior  left hemispheric mixed density mass measuring at least 5 cm in diameter with associated vasogenic edema and regional midline shift.  MRI done noted signs consistent with meningioma with extensive mass-effect noting 11 mm subfalcine herniation.  Patient admitted to the hospitalist service with neurosurgical consultation.  Started on IV Remdisivir and steroids.  Hospital Course:  Principal Problem:   Acute respiratory failure with hypoxia ([place]) secondary to COVID-19 infection: Patient treated with IV Remdisivir and steroids.  Test confirmed by PCR on admission.  Patient completed for 5-day course of remdesivir.  Hypoxia resolved and patient on room air.  Today has been 10 days from diagnosis.  Not felt to be acutely contagious.    Active Problems:   BPH (benign prostatic hyperplasia): Continue terazosin.  Acute kidney injury in the setting of CKD (chronic kidney disease), stage IIIa: Mildly elevated initially due to dehydration.  Resolved with gentle IV fluids. Stable at baseline.    DVT (deep venous thrombosis) ([place]):, Continued on Coumadin managed as per pharmacy.   Hypertension   PAF (paroxysmal atrial fibrillation) ([place]): Heart rate remained controlled.  INR 2.6 on day of discharge.    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  Neurology consultation by Dr. Izora Ribas.  No need for inpatient intervention.  Keppra stopped as per recommendation and to continue steroids for at least 5 to 7 days.  Weaned off upon discharge.    Acute metabolic encephalopathy: Felt to be in part due to hypoxia from COVID as well as due to some mass-effect  from brain mass.  Resolved.  Back to baseline.    Hypertensive urgency: Unclear if he was not taking all of his medications initially prior to coming in because of confusion.  Blood pressure now much more stabilized.  SIRS-POA: Patient met SIRS criteria admission given COVID infection, however confirmed that no evidence of  additional infection.  Lactic acid and white blood cell count normal.  Sepsis ruled out.  Procedures:  None   Consultations:  Palliative care   Discharge Exam: BP (!) 140/56 (BP Location: Right Arm)   Pulse 89   Temp 98.1 F (36.7 C) (Oral)   Resp 15   Ht 5' 7"  (1.702 m)   Wt 83.9 kg   SpO2 96%   BMI 28.98 kg/m   General:Alert and oriented x 2  Cardiovascular: Irregular rhythm, rate controlled  Respiratory: clear to auscultation bilaterally   Discharge Instructions You were cared for by a hospitalist during your hospital stay. If you have any questions about your discharge medications or the care you received while you were in the hospital after you are discharged, you can call the unit and asked to speak with the hospitalist on call if the hospitalist that took care of you is not available. Once you are discharged, your primary care physician will handle any further medical issues. Please note that NO REFILLS for any discharge medications will be authorized once you are discharged, as it is imperative that you return to your primary care physician (or establish a relationship with a primary care physician if you do not have one) for your aftercare needs so that they can reassess your need for medications and monitor your lab values.  Discharge Instructions    Diet - low sodium heart healthy   Complete by: As directed    Increase activity slowly   Complete by: As directed      Allergies as of 03/18/2021      Reactions   Celebrex [celecoxib]       Medication List    TAKE these medications   acetaminophen 500 MG tablet Commonly known as: TYLENOL Take 500-1,000 mg by mouth every 6 (six) hours as needed for mild pain or moderate pain.   amLODipine 2.5 MG tablet Commonly known as: NORVASC Take 2.5 mg by mouth at bedtime.   dexamethasone 4 MG tablet Commonly known as: DECADRON Take 1 tablet (4 mg total) by mouth 3 (three) times daily for 1 day, THEN 1 tablet (4 mg total)  2 (two) times daily for 1 day, THEN 1 tablet (4 mg total) daily for 1 day, THEN 0.5 tablets (2 mg total) daily for 1 day. Start taking on: March 18, 2021   ipratropium 17 MCG/ACT inhaler Commonly known as: ATROVENT HFA Inhale 2 puffs into the lungs every 4 (four) hours.   olmesartan 20 MG tablet Commonly known as: BENICAR Take 20 mg by mouth daily.   pantoprazole 40 MG tablet Commonly known as: PROTONIX Take 40 mg by mouth 2 (two) times daily.   simvastatin 20 MG tablet Commonly known as: ZOCOR Take 20 mg by mouth at bedtime.   terazosin 5 MG capsule Commonly known as: HYTRIN Take 5 mg by mouth at bedtime.   warfarin 5 MG tablet Commonly known as: COUMADIN Take 5 mg by mouth every evening.       Allergies  Allergen Reactions  . Celebrex [Celecoxib]     Contact information for after-discharge care    Destination    [facility]  Preferred SNF .   Service: Skilled Nursing Contact information: [address] [place] [phone]                   The results of significant diagnostics from this hospitalization (including imaging, microbiology, ancillary and laboratory) are listed below for reference.    Significant Diagnostic Studies: DG Chest 2 View  Result Date: 03/08/2021 CLINICAL DATA:  87 year old male positive COVID-19.  Weakness. EXAM: CHEST - 2 VIEW COMPARISON:  None. FINDINGS: Seated AP and lateral views of the chest. Borderline to mild cardiomegaly. Other mediastinal contours are within normal limits. Visualized tracheal air column is within normal limits. Lung volumes and ventilation within normal limits. No pneumothorax, pleural effusion, pulmonary edema or confluent pulmonary opacity. Probable widespread thoracic spine ankylosis due to flowing endplate osteophytes. Osteopenia. No acute osseous abnormality identified. Negative visible bowel gas. IMPRESSION: No acute cardiopulmonary abnormality. Electronically  Signed   By: Genevie Ann M.D.   On: 03/08/2021 08:27   CT Head Wo Contrast  Result Date: 03/08/2021 CLINICAL DATA:  87 year old male status post falls, head trauma. History of C1 fracture last year. And cervical spine EXAM: CT HEAD WITHOUT CONTRAST TECHNIQUE: Contiguous axial images were obtained from the base of the skull through the vertex without intravenous contrast. COMPARISON:  Head CT 01/17/2020. FINDINGS: Brain: Large heterogeneous and lobulated mass in the anterior left hemisphere, possibly extra-axial (series 4, image 15) with subtle visualization last year (series 3, image 19) but associated new anterior brain vasogenic edema including enlargement and hypodensity of the genu of the corpus callosum on series 4, image 16. Subsequently, precise margins of the mass are difficult to delineate, but diameter of at least 5 cm is suspected on coronal image 17. Regional mass effect including on both frontal horns. Rightward midline shift at the anterior septum pellucidum up to 9 mm. But basilar cisterns remain normal. No trapping or enlargement of the ventricles. No acute intracranial hemorrhage identified. No cortically based acute infarct identified. Chronic bilateral white matter and deep gray matter nuclei heterogeneity. Vascular: Calcified atherosclerosis at the skull base. No suspicious intracranial vascular hyperdensity. Skull: Partially healed C1 fracture, potential nondisplaced nonunion left lateral renal. No acute osseous abnormality identified. Sinuses/Orbits: Visualized paranasal sinuses and mastoids are stable and well aerated. Other: Mild forehead scalp hematoma on series 2, image 49. Underlying frontal bone and sinus appear intact. Otherwise stable orbit and scalp soft tissues. IMPRESSION: 1. Anterior left hemisphere mixed density mass measures at least 5 cm diameter with associated vasogenic edema, regional midline shift. Although a progressive left frontal Meningioma is possible, hypodensity and  expansion of the anterior corpus callosum is suspicious for an infiltrative malignancy. Recommend Brain MRI without and with contrast to further characterize. 2.  No acute traumatic injury identified to the brain. 3. Mild anterior scalp hematoma.  No skull fracture identified. Electronically Signed   By: Genevie Ann M.D.   On: 03/08/2021 08:42   MR Brain W and Wo Contrast  Result Date: 03/08/2021 CLINICAL DATA:  Brain mass. EXAM: MRI HEAD WITHOUT AND WITH CONTRAST TECHNIQUE: Multiplanar, multiecho pulse sequences of the brain and surrounding structures were obtained without and with intravenous contrast. CONTRAST:  7.9m GADAVIST GADOBUTROL 1 MMOL/ML IV SOLN COMPARISON:  CT head 03/08/2021 FINDINGS: Brain: Large enhancing mass left frontal lobe measuring approximately 4.2 x 4.2 x 5.2 cm. Much of the mass shows intense homogeneous enhancement, there is cystic change within the mass. The mass abuts the dural surface and appears  to be extra-axial. There is mild susceptibility in the mass likely due to calcification. There is considerable mass-effect on the left frontal lobe with moderate vasogenic edema in the white matter. Subfalcine herniation is present approximately 11 mm. Generalized atrophy. Chronic microvascular ischemic changes in the white matter. No acute ischemic infarct. Vascular: Normal arterial flow voids. Skull and upper cervical spine: No focal skeletal abnormality. Sinuses/Orbits: Mild mucosal edema paranasal sinuses. Negative orbit. Left mastoid effusion. Other: None IMPRESSION: Large enhancing mass left frontal lobe appears extra-axial and consistent with meningioma. There is extensive mass-effect and edema in the left frontal lobe with 11 mm subfalcine herniation. Electronically Signed   By: Franchot Gallo M.D.   On: 03/08/2021 19:15   US Venous Img Lower Bilateral (DVT)  Result Date: 03/08/2021 CLINICAL DATA:  Lower extremity swelling. EXAM: BILATERAL LOWER EXTREMITY VENOUS DOPPLER ULTRASOUND  TECHNIQUE: Gray-scale sonography with compression, as well as color and duplex ultrasound, were performed to evaluate the deep venous system(s) from the level of the common femoral vein through the popliteal and proximal calf veins. COMPARISON:  None. FINDINGS: VENOUS Normal compressibility of the common femoral, superficial femoral, and popliteal veins, as well as the visualized calf veins. Visualized portions of profunda femoral vein and great saphenous vein unremarkable. No filling defects to suggest DVT on grayscale or color Doppler imaging. Doppler waveforms show normal direction of venous flow, normal respiratory plasticity and response to augmentation. Limited views of the contralateral common femoral vein are unremarkable. OTHER None. Limitations: none IMPRESSION: Negative. Electronically Signed   By: Dorise Bullion III M.D   On: 03/08/2021 11:25   DG Chest Port 1 View  Result Date: 03/13/2021 CLINICAL DATA:  Weakness and cough EXAM: PORTABLE CHEST 1 VIEW COMPARISON:  03/08/2021 FINDINGS: The heart size and mediastinal contours are within normal limits. Aortic atherosclerosis. Both lungs are clear. The visualized skeletal structures are unremarkable. IMPRESSION: No active disease. Electronically Signed   By: Donavan Foil M.D.   On: 03/13/2021 19:07   DG ESOPHAGUS W DOUBLE CM (HD)  Result Date: 02/23/2021 CLINICAL DATA:  Dysphagia, vomiting of solids and liquids EXAM: ESOPHOGRAM / BARIUM SWALLOW / BARIUM TABLET STUDY TECHNIQUE: Combined double contrast and single contrast examination performed using effervescent crystals, thick barium liquid, and thin barium liquid. The patient was observed with fluoroscopy swallowing a 13 mm barium sulphate tablet. FLUOROSCOPY TIME:  Fluoroscopy Time:  1 minute 18 seconds Radiation Exposure Index (if provided by the fluoroscopic device): 11.4 mGy COMPARISON:  None. FINDINGS: Patient swallowed barium without difficulty. Some pharyngeal retention. No aspiration. There  is a moderate hiatal hernia. Persistent narrowing at the gastroesophageal junction. Barium pill did not pass through this area. There is mild esophageal dysmotility. IMPRESSION: Moderate hiatal hernia. Persistent narrowing at the gastroesophageal junction. Barium pill did not pass through this area. Mild esophageal dysmotility. Electronically Signed   By: Macy Mis M.D.   On: 02/23/2021 10:59    Microbiology: No results found for this or any previous visit (from the past 240 hour(s)).   Labs: Basic Metabolic Panel: Recent Labs  Lab 03/12/21 0555 03/13/21 0551  NA 138 138  K 4.5 4.2  CL 101 103  CO2 29 28  GLUCOSE 123* 130*  BUN 40* 48*  CREATININE 1.30* 1.22  CALCIUM 8.9 8.6*   Liver Function Tests: Recent Labs  Lab 03/12/21 0555 03/13/21 0551  AST 23 18  ALT 19 18  ALKPHOS 41 39  BILITOT 0.7 0.6  PROT 5.6* 5.3*  ALBUMIN 3.4* 3.3*  No results for input(s): LIPASE, AMYLASE in the last 168 hours. No results for input(s): AMMONIA in the last 168 hours. CBC: Recent Labs  Lab 03/12/21 0555 03/13/21 0551 03/17/21 0456  WBC 7.9 9.0 10.4  NEUTROABS 6.4 7.6  --   HGB 12.8* 12.8* 13.0  HCT 37.2* 36.4* 38.2*  MCV 88.4 86.1 86.0  PLT 208 221 263   Cardiac Enzymes: No results for input(s): CKTOTAL, CKMB, CKMBINDEX, TROPONINI in the last 168 hours. BNP: BNP (last 3 results) No results for input(s): BNP in the last 8760 hours.  ProBNP (last 3 results) No results for input(s): PROBNP in the last 8760 hours.  CBG: No results for input(s): GLUCAP in the last 168 hours.     Signed:  Annita Brod, MD [facility] 03/18/2021, 3:29 PM

## 2021-03-18 NOTE — Consult Note (Signed)
[facility] for Warfarin Indication: History of DVT / Afib  Patient Measurements: Height: 5\' 7"  (170.2 cm) Weight: 83.9 kg (185 lb) IBW/kg (Calculated) : 66.1  Labs: Recent Labs    03/16/21 0420 03/17/21 0456 03/18/21 0455  HGB  --  13.0  --   HCT  --  38.2*  --   PLT  --  263  --   LABPROT 34.0* 32.1* 28.1*  INR 3.4* 3.1* 2.6*    Estimated Creatinine Clearance (by C-G formula based on SCr of 1.22 mg/dL) Male: 34.8 mL/min Male: 42.5 mL/min   Medical History: Past Medical History:  Diagnosis Date  . BPH (benign prostatic hyperplasia)   . Chronic kidney disease   . DVT (deep venous thrombosis) ([place]) 2014   right leg, IVC filter  . Dysrhythmia 2019   Paroxysmal atrial fibrillation  . Hypertension     Medications:  Warfarin 5 mg daily prior to admission per med rec  Per [facility] records: warfarin 5 mg alternating with 7.5 mg every other day  Last patient reported dose 5/21 at 1800  Assessment: Patient is an 87 y/o M with medical history as above including Afib / DVT s/p IVC filter on warfarin who presented with generalized weakness / falls. Subsequently found to be positive for COVID-19 infection. 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   Pharmacy has been consulted for management of warfarin while patient is in-house.   Suspect INR was low given patient was holding warfarin for EGD on 03/06/21 and likely just resumed within pervious 1-2 days  DDIs: dexamethasone  Date INR Warfarin Plan  5/22 1.2  7.5 mg  5/23 1.3 7.5mg   5/24 1.6 5 mg  5/25 2.0 7.5  5/26 2.0 5mg   5/27 2.2 7.5 mg  5/28 2.5 5 mg  5/29 3.3 4 mg  5/30 3.4 2 mg  5/31 3.1 2 mg  6/01 2.6     Goal of Therapy:  INR 2-3   Plan:   INR therapeutic but trending down (3.1>2.6) after 3 days of reduced dosing:  Will order 5 mg warfarin x 1 today.  Hgb 13.0  plt 263  continue to monitor INR  daily. It has been noted that elevated INRs have been seen in Covid+ patients: monitor  CBC at least every 3 days per protocol  Lorna Dibble, PharmD Clinical Pharmacist 03/18/2021 8:25 AM

## 2021-03-18 NOTE — Plan of Care (Signed)
  Problem: Education: Goal: Knowledge of General Education information will improve Description: Including pain rating scale, medication(s)/side effects and non-pharmacologic comfort measures Outcome: Progressing   Problem: Clinical Measurements: Goal: Ability to maintain clinical measurements within normal limits will improve Outcome: Progressing Goal: Will remain free from infection Outcome: Progressing Goal: Diagnostic test results will improve Outcome: Progressing Goal: Respiratory complications will improve Outcome: Progressing Goal: Cardiovascular complication will be avoided Outcome: Progressing   Problem: Activity: Goal: Risk for activity intolerance will decrease Outcome: Progressing   Problem: Elimination: Goal: Will not experience complications related to bowel motility Outcome: Progressing Goal: Will not experience complications related to urinary retention Outcome: Progressing   Problem: Nutrition: Goal: Adequate nutrition will be maintained Outcome: Progressing   Problem: Pain Managment: Goal: General experience of comfort will improve Outcome: Progressing

## 2021-08-01 IMAGING — US US EXTREM LOW VENOUS
1 series · 14 of 24 positions shown · non-contrast
Comparison: None.

CLINICAL DATA: Lower extremity swelling.

EXAM:
BILATERAL LOWER EXTREMITY VENOUS DOPPLER ULTRASOUND
TECHNIQUE: Gray-scale sonography with compression, as well as color and duplex
ultrasound, were performed to evaluate the deep venous system(s)
from the level of the common femoral vein through the popliteal and
proximal calf veins.

[Series 1: us venous img lower bilat (dvt) · portal-venous · 14 of 64 slices shown]
[im 1/64]
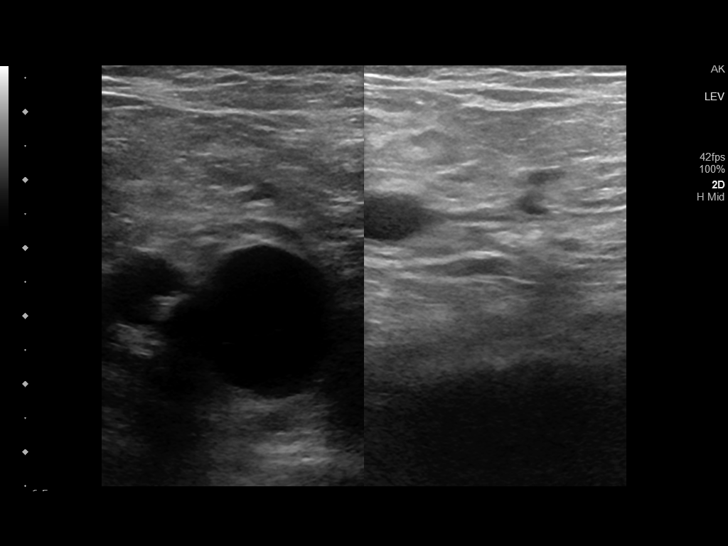
[im 6/64]
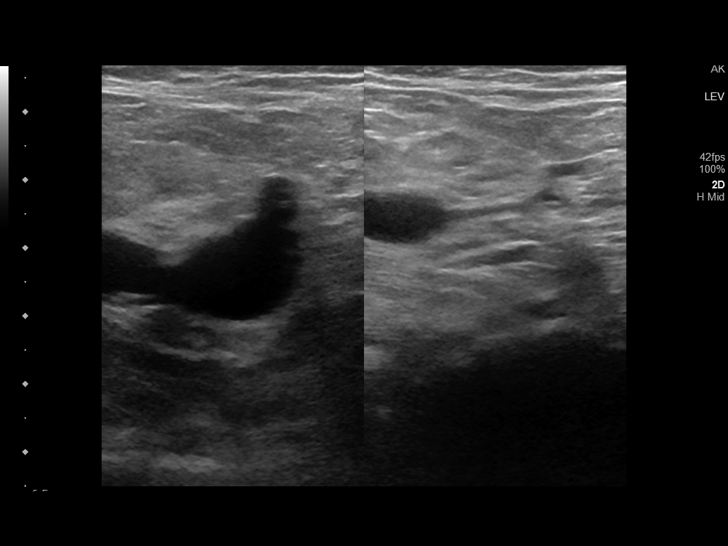
[im 11/64]
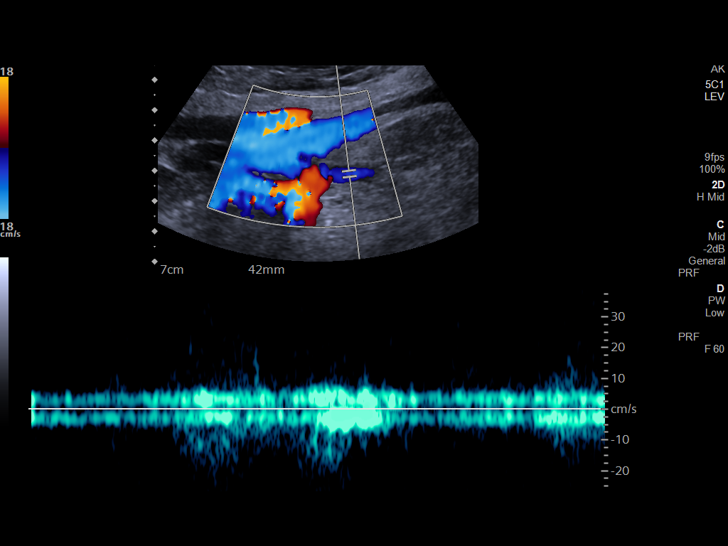
[im 17/64]
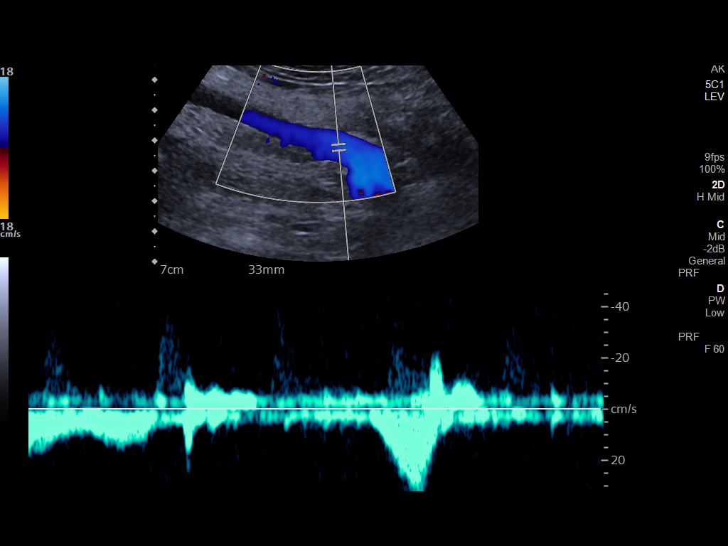
[im 20/64]
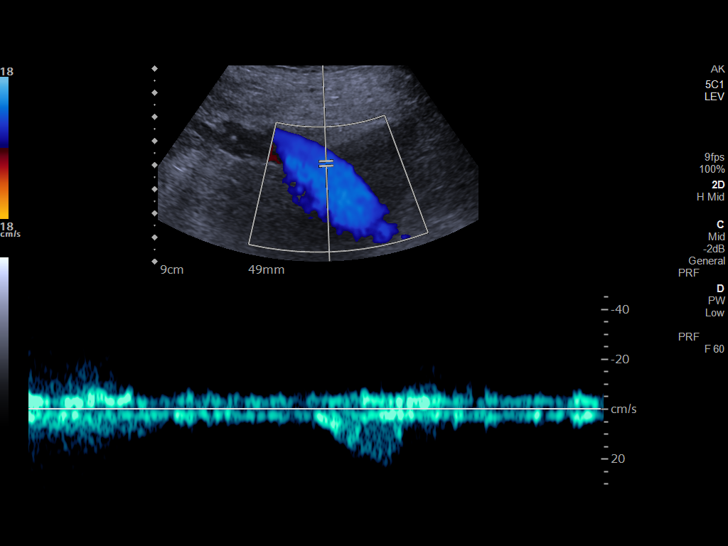
[im 25/64]
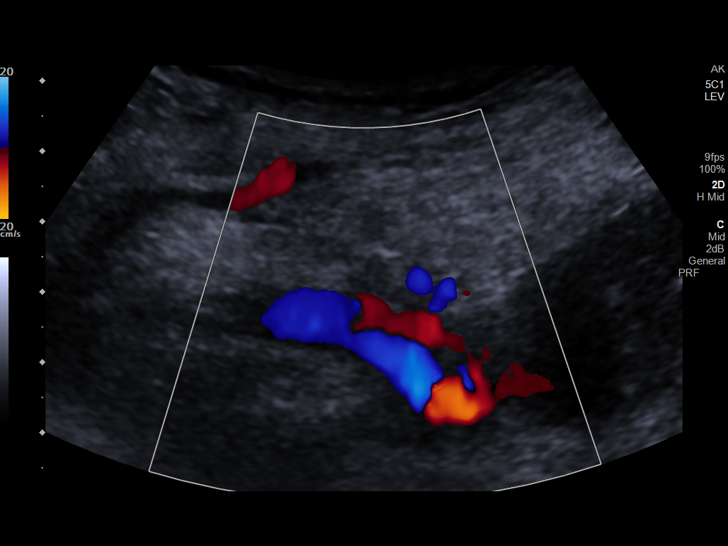
[im 31/64]
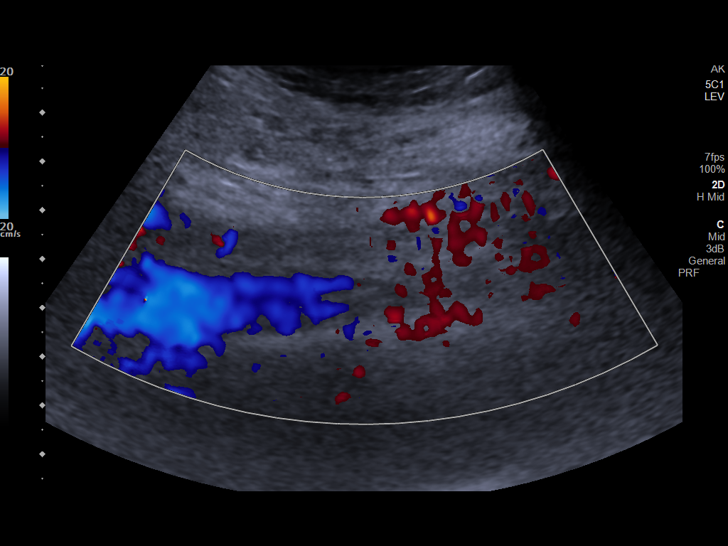
[im 33/64]
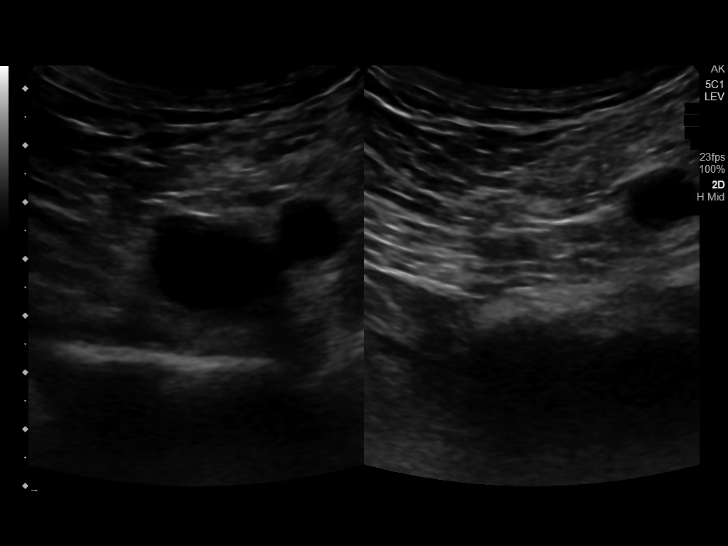
[im 39/64]
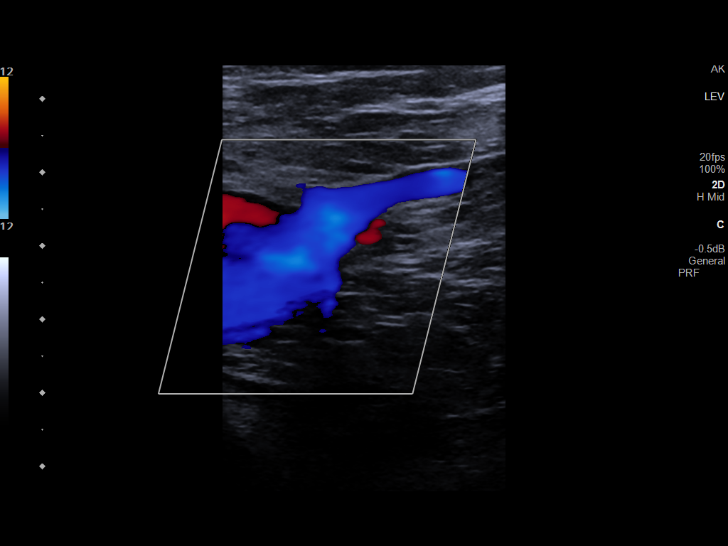
[im 44/64]
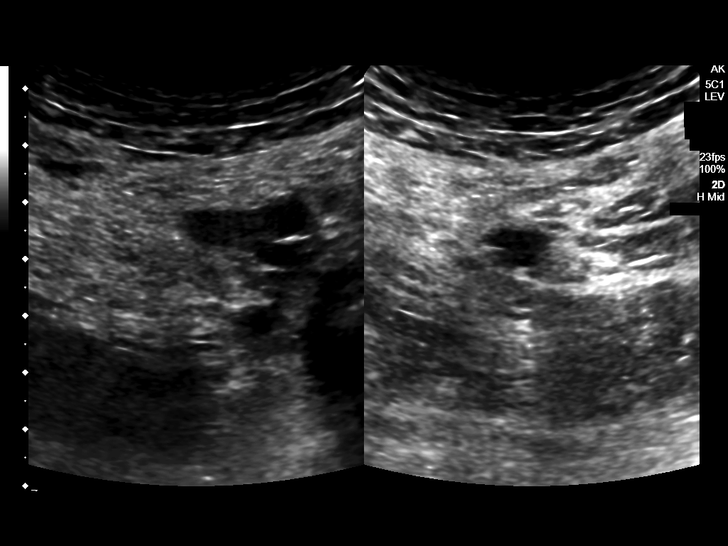
[im 50/64]
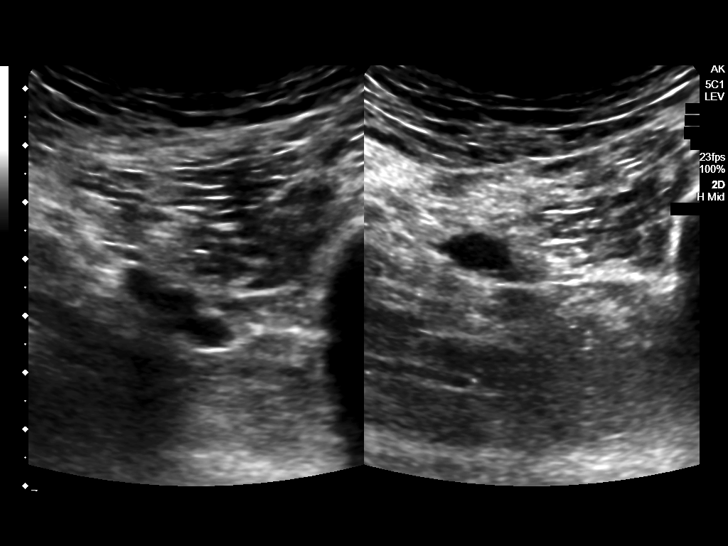
[im 53/64]
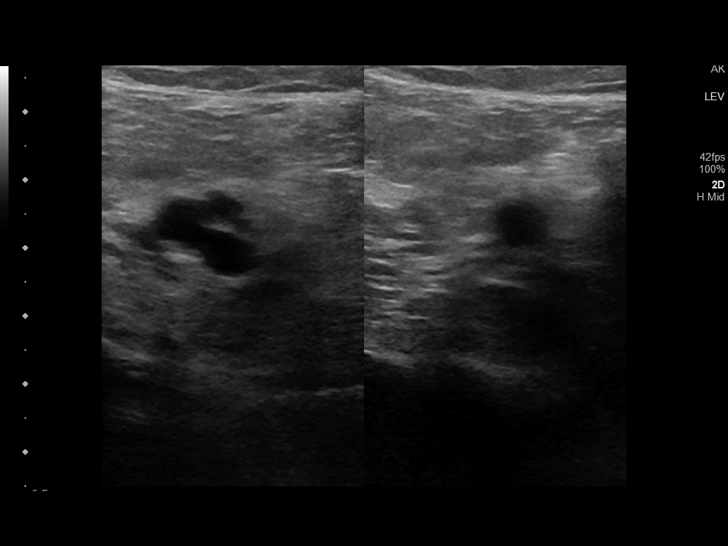
[im 58/64]
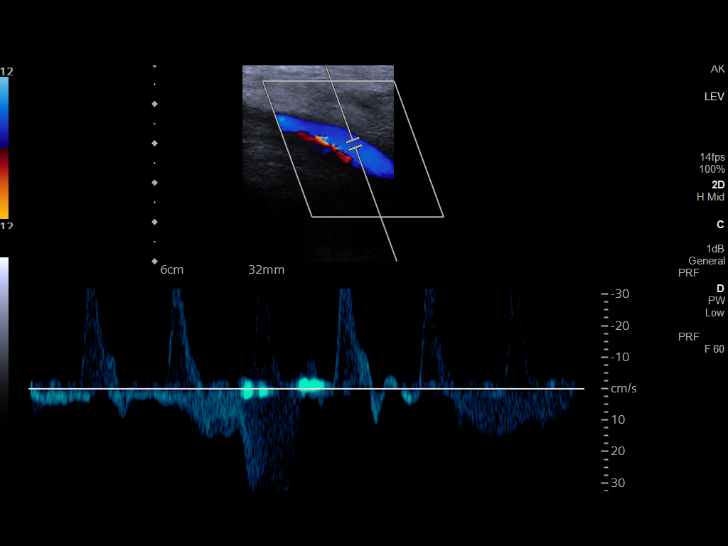
[im 64/64]
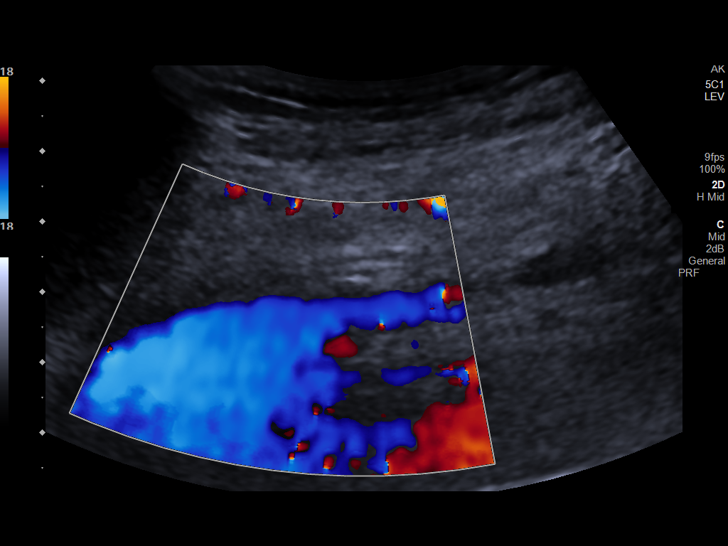

[14 of 24 positions shown; findings below may reference images not displayed]

FINDINGS: VENOUS

Normal compressibility of the common femoral, superficial femoral,
and popliteal veins, as well as the visualized calf veins.
Visualized portions of profunda femoral vein and great saphenous
vein unremarkable. No filling defects to suggest DVT on grayscale or
color Doppler imaging. Doppler waveforms show normal direction of
venous flow, normal respiratory plasticity and response to
augmentation.

Limited views of the contralateral common femoral vein are
unremarkable.

OTHER

None.

Limitations: none
IMPRESSION: Negative.

## 2021-08-06 IMAGING — DX DG CHEST 1V PORT
1 series · 1 of 1 positions shown · non-contrast
Comparison: 03/08/2021

CLINICAL DATA: Weakness and cough

EXAM:
PORTABLE CHEST 1 VIEW

[chest ap]
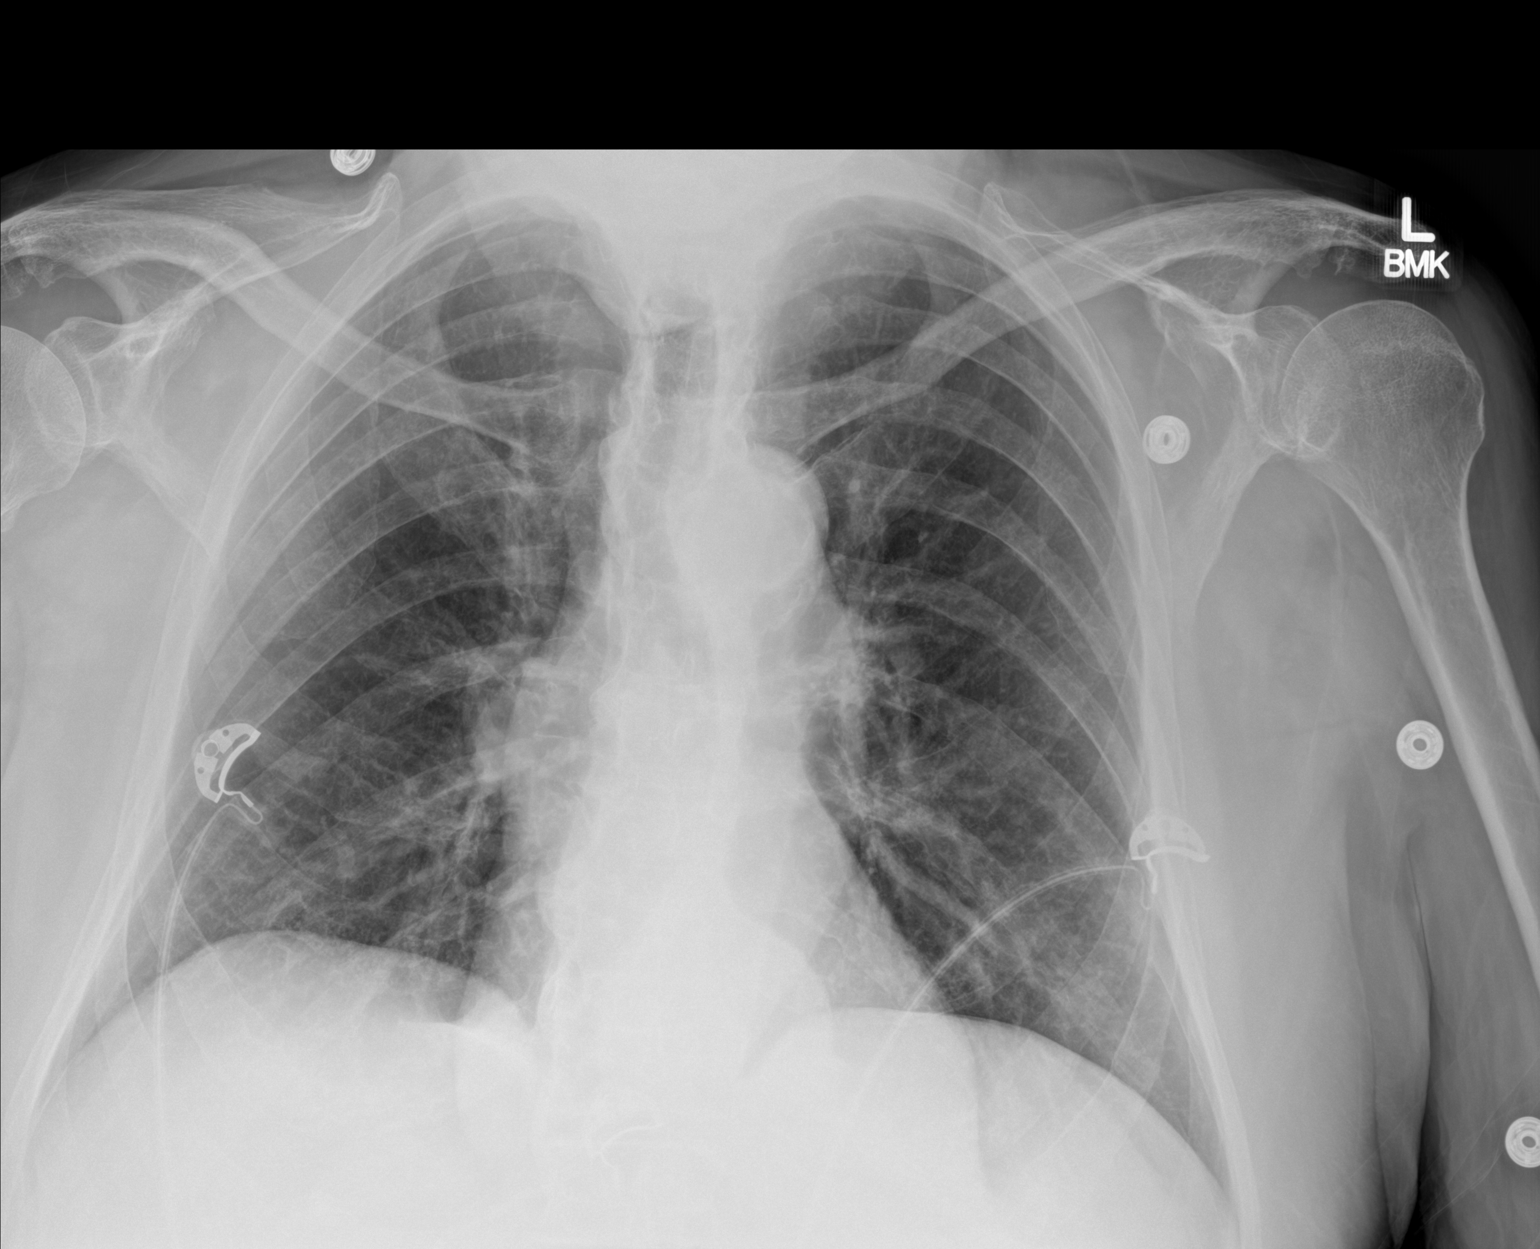

[1 of 1 positions shown; findings below may reference images not displayed]

FINDINGS: The heart size and mediastinal contours are within normal limits.
Aortic atherosclerosis. Both lungs are clear. The visualized
skeletal structures are unremarkable.
IMPRESSION: No active disease.

## 2021-09-08 ENCOUNTER — Inpatient Hospital Stay
Admission: EM | Admit: 2021-09-08 | Discharge: 2021-09-17 | DRG: 871 | Disposition: A | Discharge status: Skilled Nursing Facility | Payer: Medicare Other | Attending: Internal Medicine | Admitting: Internal Medicine

## 2021-09-08 ENCOUNTER — Emergency Department: Payer: Medicare Other

## 2021-09-08 ENCOUNTER — Other Ambulatory Visit: Payer: Self-pay

## 2021-09-08 ENCOUNTER — Encounter: Payer: Self-pay | Admitting: Emergency Medicine

## 2021-09-08 DIAGNOSIS — R29898 Other symptoms and signs involving the musculoskeletal system: Secondary | ICD-10-CM | POA: Diagnosis not present

## 2021-09-08 DIAGNOSIS — I129 Hypertensive chronic kidney disease with stage 1 through stage 4 chronic kidney disease, or unspecified chronic kidney disease: Secondary | ICD-10-CM | POA: Diagnosis present

## 2021-09-08 DIAGNOSIS — Z7901 Long term (current) use of anticoagulants: Secondary | ICD-10-CM

## 2021-09-08 DIAGNOSIS — N179 Acute kidney failure, unspecified: Secondary | ICD-10-CM | POA: Diagnosis present

## 2021-09-08 DIAGNOSIS — Z86011 Personal history of benign neoplasm of the brain: Secondary | ICD-10-CM

## 2021-09-08 DIAGNOSIS — Z888 Allergy status to other drugs, medicaments and biological substances status: Secondary | ICD-10-CM | POA: Diagnosis not present

## 2021-09-08 DIAGNOSIS — I10 Essential (primary) hypertension: Secondary | ICD-10-CM | POA: Diagnosis present

## 2021-09-08 DIAGNOSIS — Z8616 Personal history of COVID-19: Secondary | ICD-10-CM | POA: Diagnosis not present

## 2021-09-08 DIAGNOSIS — R1310 Dysphagia, unspecified: Secondary | ICD-10-CM | POA: Diagnosis present

## 2021-09-08 DIAGNOSIS — D320 Benign neoplasm of cerebral meninges: Secondary | ICD-10-CM | POA: Diagnosis present

## 2021-09-08 DIAGNOSIS — N400 Enlarged prostate without lower urinary tract symptoms: Secondary | ICD-10-CM | POA: Diagnosis present

## 2021-09-08 DIAGNOSIS — W19XXXA Unspecified fall, initial encounter: Secondary | ICD-10-CM | POA: Diagnosis not present

## 2021-09-08 DIAGNOSIS — J189 Pneumonia, unspecified organism: Secondary | ICD-10-CM | POA: Diagnosis present

## 2021-09-08 DIAGNOSIS — R000 Tachycardia, unspecified: Secondary | ICD-10-CM | POA: Diagnosis present

## 2021-09-08 DIAGNOSIS — A409 Streptococcal sepsis, unspecified: Secondary | ICD-10-CM | POA: Diagnosis present

## 2021-09-08 DIAGNOSIS — K219 Gastro-esophageal reflux disease without esophagitis: Secondary | ICD-10-CM | POA: Diagnosis present

## 2021-09-08 DIAGNOSIS — Z79899 Other long term (current) drug therapy: Secondary | ICD-10-CM | POA: Diagnosis not present

## 2021-09-08 DIAGNOSIS — Z86718 Personal history of other venous thrombosis and embolism: Secondary | ICD-10-CM | POA: Diagnosis not present

## 2021-09-08 DIAGNOSIS — Y92009 Unspecified place in unspecified non-institutional (private) residence as the place of occurrence of the external cause: Secondary | ICD-10-CM

## 2021-09-08 DIAGNOSIS — R5381 Other malaise: Secondary | ICD-10-CM | POA: Diagnosis present

## 2021-09-08 DIAGNOSIS — R7881 Bacteremia: Secondary | ICD-10-CM | POA: Diagnosis not present

## 2021-09-08 DIAGNOSIS — N1831 Chronic kidney disease, stage 3a: Secondary | ICD-10-CM | POA: Diagnosis present

## 2021-09-08 DIAGNOSIS — A419 Sepsis, unspecified organism: Secondary | ICD-10-CM

## 2021-09-08 DIAGNOSIS — I480 Paroxysmal atrial fibrillation: Secondary | ICD-10-CM | POA: Diagnosis present

## 2021-09-08 DIAGNOSIS — Z7189 Other specified counseling: Secondary | ICD-10-CM | POA: Diagnosis not present

## 2021-09-08 DIAGNOSIS — G9341 Metabolic encephalopathy: Secondary | ICD-10-CM | POA: Diagnosis present

## 2021-09-08 DIAGNOSIS — Z66 Do not resuscitate: Secondary | ICD-10-CM | POA: Diagnosis present

## 2021-09-08 DIAGNOSIS — J154 Pneumonia due to other streptococci: Secondary | ICD-10-CM | POA: Diagnosis present

## 2021-09-08 DIAGNOSIS — R6520 Severe sepsis without septic shock: Secondary | ICD-10-CM | POA: Diagnosis present

## 2021-09-08 DIAGNOSIS — I82409 Acute embolism and thrombosis of unspecified deep veins of unspecified lower extremity: Secondary | ICD-10-CM | POA: Diagnosis present

## 2021-09-08 DIAGNOSIS — G936 Cerebral edema: Secondary | ICD-10-CM | POA: Diagnosis present

## 2021-09-08 DIAGNOSIS — Z87891 Personal history of nicotine dependence: Secondary | ICD-10-CM | POA: Diagnosis not present

## 2021-09-08 DIAGNOSIS — Z515 Encounter for palliative care: Secondary | ICD-10-CM | POA: Diagnosis not present

## 2021-09-08 DIAGNOSIS — W06XXXA Fall from bed, initial encounter: Secondary | ICD-10-CM | POA: Diagnosis present

## 2021-09-08 DIAGNOSIS — E785 Hyperlipidemia, unspecified: Secondary | ICD-10-CM | POA: Diagnosis present

## 2021-09-08 DIAGNOSIS — Z808 Family history of malignant neoplasm of other organs or systems: Secondary | ICD-10-CM | POA: Diagnosis not present

## 2021-09-08 DIAGNOSIS — Z20822 Contact with and (suspected) exposure to covid-19: Secondary | ICD-10-CM | POA: Diagnosis present

## 2021-09-08 DIAGNOSIS — D1802 Hemangioma of intracranial structures: Secondary | ICD-10-CM | POA: Diagnosis present

## 2021-09-08 LAB — COMPREHENSIVE METABOLIC PANEL
ALT: 9 U/L (ref 0–44)
AST: 19 U/L (ref 15–41)
Albumin: 4 g/dL (ref 3.5–5.0)
Alkaline Phosphatase: 50 U/L (ref 38–126)
Anion gap: 7 (ref 5–15)
BUN: 32 mg/dL — ABNORMAL HIGH (ref 8–23)
CO2: 31 mmol/L (ref 22–32)
Calcium: 9.3 mg/dL (ref 8.9–10.3)
Chloride: 103 mmol/L (ref 98–111)
Creatinine, Ser: 1.74 mg/dL — ABNORMAL HIGH (ref 0.61–1.24)
GFR, Estimated: 37 mL/min — ABNORMAL LOW (ref >60)
Glucose, Bld: 109 mg/dL — ABNORMAL HIGH (ref 70–99)
Potassium: 3.9 mmol/L (ref 3.5–5.1)
Sodium: 141 mmol/L (ref 135–145)
Total Bilirubin: 0.8 mg/dL (ref 0.3–1.2)
Total Protein: 6.7 g/dL (ref 6.5–8.1)

## 2021-09-08 LAB — CBC WITH DIFFERENTIAL/PLATELET
Abs Immature Granulocytes: 0.11 10*3/uL — ABNORMAL HIGH (ref 0.00–0.07)
Basophils Absolute: 0 10*3/uL (ref 0.0–0.1)
Basophils Relative: 0 %
Eosinophils Absolute: 0 10*3/uL (ref 0.0–0.5)
Eosinophils Relative: 0 %
HCT: 36.7 % — ABNORMAL LOW (ref 39.0–52.0)
Hemoglobin: 11.9 g/dL — ABNORMAL LOW (ref 13.0–17.0)
Immature Granulocytes: 1 %
Lymphocytes Relative: 3 %
Lymphs Abs: 0.5 10*3/uL — ABNORMAL LOW (ref 0.7–4.0)
MCH: 29.1 pg (ref 26.0–34.0)
MCHC: 32.4 g/dL (ref 30.0–36.0)
MCV: 89.7 fL (ref 80.0–100.0)
Monocytes Absolute: 0.7 10*3/uL (ref 0.1–1.0)
Monocytes Relative: 4 %
Neutro Abs: 14.9 10*3/uL — ABNORMAL HIGH (ref 1.7–7.7)
Neutrophils Relative %: 92 %
Platelets: 241 10*3/uL (ref 150–400)
RBC: 4.09 MIL/uL — ABNORMAL LOW (ref 4.22–5.81)
RDW: 13 % (ref 11.5–15.5)
WBC: 16.2 10*3/uL — ABNORMAL HIGH (ref 4.0–10.5)
nRBC: 0 % (ref 0.0–0.2)

## 2021-09-08 LAB — URINALYSIS, COMPLETE (UACMP) WITH MICROSCOPIC
Bilirubin Urine: NEGATIVE
Glucose, UA: NEGATIVE mg/dL
Hgb urine dipstick: NEGATIVE
Ketones, ur: NEGATIVE mg/dL
Leukocytes,Ua: NEGATIVE
Nitrite: NEGATIVE
Protein, ur: NEGATIVE mg/dL
Specific Gravity, Urine: 1.01 (ref 1.005–1.030)
Squamous Epithelial / HPF: NONE SEEN (ref 0–5)
pH: 5 (ref 5.0–8.0)

## 2021-09-08 LAB — RESP PANEL BY RT-PCR (FLU A&B, COVID) ARPGX2
Influenza A by PCR: NEGATIVE
Influenza B by PCR: NEGATIVE
SARS Coronavirus 2 by RT PCR: NEGATIVE

## 2021-09-08 LAB — BRAIN NATRIURETIC PEPTIDE: B Natriuretic Peptide: 251.8 pg/mL — ABNORMAL HIGH (ref 0.0–100.0)

## 2021-09-08 LAB — PROTIME-INR
INR: 1.6 — ABNORMAL HIGH (ref 0.8–1.2)
Prothrombin Time: 19.2 seconds — ABNORMAL HIGH (ref 11.4–15.2)

## 2021-09-08 LAB — APTT: aPTT: 32 seconds (ref 24–36)

## 2021-09-08 LAB — LACTIC ACID, PLASMA: Lactic Acid, Venous: 1.8 mmol/L (ref 0.5–1.9)

## 2021-09-08 LAB — PROCALCITONIN: Procalcitonin: 0.23 ng/mL

## 2021-09-08 MED ORDER — WARFARIN SODIUM 5 MG PO TABS
5.0000 mg | ORAL_TABLET | Freq: Once | ORAL | Status: AC
  Administered 2021-09-08: 22:00:00 5 mg via ORAL
  Filled 2021-09-08 (×2): qty 1

## 2021-09-08 MED ORDER — SIMVASTATIN 20 MG PO TABS
20.0000 mg | ORAL_TABLET | Freq: Every day | ORAL | Status: DC
  Administered 2021-09-08 – 2021-09-16 (×9): 20 mg via ORAL
  Filled 2021-09-08 (×9): qty 1

## 2021-09-08 MED ORDER — LACTATED RINGERS IV BOLUS
1000.0000 mL | Freq: Once | INTRAVENOUS | Status: AC
  Administered 2021-09-08: 1000 mL via INTRAVENOUS

## 2021-09-08 MED ORDER — SODIUM CHLORIDE 0.9 % IV SOLN
2.0000 g | INTRAVENOUS | Status: DC
  Administered 2021-09-08 – 2021-09-09 (×2): 2 g via INTRAVENOUS
  Filled 2021-09-08: qty 20
  Filled 2021-09-08: qty 2
  Filled 2021-09-08: qty 20

## 2021-09-08 MED ORDER — HYDRALAZINE HCL 20 MG/ML IJ SOLN: 5.0000 mg | INTRAMUSCULAR | Status: DC | PRN

## 2021-09-08 MED ORDER — DEXAMETHASONE 4 MG PO TABS
4.0000 mg | ORAL_TABLET | Freq: Four times a day (QID) | ORAL | Status: DC
  Administered 2021-09-08 – 2021-09-12 (×15): 4 mg via ORAL
  Filled 2021-09-08 (×15): qty 1

## 2021-09-08 MED ORDER — METOPROLOL TARTRATE 5 MG/5ML IV SOLN: 2.5000 mg | INTRAVENOUS | Status: DC | PRN

## 2021-09-08 MED ORDER — TERAZOSIN HCL 5 MG PO CAPS
5.0000 mg | ORAL_CAPSULE | Freq: Every day | ORAL | Status: DC
  Administered 2021-09-08 – 2021-09-16 (×9): 5 mg via ORAL
  Filled 2021-09-08 (×11): qty 1

## 2021-09-08 MED ORDER — WARFARIN - PHARMACIST DOSING INPATIENT
Freq: Every day | Status: DC
  Filled 2021-09-08: qty 1

## 2021-09-08 MED ORDER — SODIUM CHLORIDE 0.9 % IV SOLN
500.0000 mg | INTRAVENOUS | Status: DC
  Administered 2021-09-08: 500 mg via INTRAVENOUS
  Filled 2021-09-08 (×3): qty 500

## 2021-09-08 MED ORDER — ONDANSETRON HCL 4 MG/2ML IJ SOLN: 4.0000 mg | Freq: Three times a day (TID) | INTRAMUSCULAR | Status: DC | PRN

## 2021-09-08 MED ORDER — ACETAMINOPHEN 325 MG PO TABS
650.0000 mg | ORAL_TABLET | Freq: Four times a day (QID) | ORAL | Status: DC | PRN
  Administered 2021-09-11 – 2021-09-13 (×3): 650 mg via ORAL
  Filled 2021-09-08 (×4): qty 2

## 2021-09-08 MED ORDER — PANTOPRAZOLE SODIUM 40 MG PO TBEC
40.0000 mg | DELAYED_RELEASE_TABLET | Freq: Two times a day (BID) | ORAL | Status: DC
  Administered 2021-09-08 – 2021-09-17 (×18): 40 mg via ORAL
  Filled 2021-09-08 (×18): qty 1

## 2021-09-08 MED ORDER — ACETAMINOPHEN 500 MG PO TABS
1000.0000 mg | ORAL_TABLET | Freq: Once | ORAL | Status: AC
  Administered 2021-09-08: 1000 mg via ORAL
  Filled 2021-09-08: qty 2

## 2021-09-08 MED ORDER — ALBUTEROL SULFATE (2.5 MG/3ML) 0.083% IN NEBU: 2.5000 mg | INHALATION_SOLUTION | RESPIRATORY_TRACT | Status: DC | PRN

## 2021-09-08 MED ORDER — DM-GUAIFENESIN ER 30-600 MG PO TB12
1.0000 | ORAL_TABLET | Freq: Two times a day (BID) | ORAL | Status: DC | PRN
  Administered 2021-09-11 – 2021-09-13 (×4): 1 via ORAL
  Filled 2021-09-08 (×4): qty 1

## 2021-09-08 MED ORDER — LACTATED RINGERS IV SOLN: INTRAVENOUS | Status: AC

## 2021-09-08 MED ORDER — SODIUM CHLORIDE 0.9 % IV SOLN
250.0000 mg | Freq: Two times a day (BID) | INTRAVENOUS | Status: DC
  Administered 2021-09-08 – 2021-09-11 (×7): 250 mg via INTRAVENOUS
  Filled 2021-09-08 (×9): qty 2.5

## 2021-09-08 MED ORDER — LACTATED RINGERS IV BOLUS (SEPSIS)
1000.0000 mL | Freq: Once | INTRAVENOUS | Status: AC
  Administered 2021-09-08: 1000 mL via INTRAVENOUS

## 2021-09-08 NOTE — ED Notes (Signed)
Pt presents to the ED for a fall. Per wife, pt was fine this morning at ate breakfast and the slide out of the bottom of the bed and could not get up. States that his R leg was weak. Per wife pt is usually alert and oriented and ambulatory with a walker but today he was unable to move and disoriented x4. On pt arrival, pt is alert but disoriented and unable to follow commands well.  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

## 2021-09-08 NOTE — ED Notes (Signed)
Informed RN bed assigned 

## 2021-09-08 NOTE — ED Triage Notes (Signed)
Pt via EMS from home. EMS was initially called out for fall. Per wife, pt is normally A&Ox4 and able to ambulate with a walker. Pt slide out the end of the bed, did not have any actual fall. Pt is having weakness in R leg but does have an inoperable tumor in that knee. Pt has a cough. Pt is alerted and disoriented x4.

## 2021-09-08 NOTE — Sepsis Progress Note (Signed)
[facility] is following this code sepsis ?

## 2021-09-08 NOTE — ED Provider Notes (Signed)
[facility] Emergency Department Provider Note ____________________________________________   Event Date/Time   First MD Initiated Contact with Patient 09/08/21 1330     (approximate)  I have reviewed the triage vital signs and the nursing notes.  HISTORY  Chief Complaint Weakness and Altered Mental Status   HPI Alexander Lane is a 87 y.o. adultwho presents to the ED for evaluation of weakness, confusion and cough  Chart review indicates paroxysmal A. fib on Coumadin.  Left frontal meningioma.   Patient presents to the ED for evaluation of weakness, cough and shortness of breath.  Patient is disoriented and unable to provide any relevant history.  Further history obtained by the wife who arrived to the bedside shortly after him.  She reports that he had a "cold" for the past couple weeks, but this morning was much more confused, weak and slid off the bed to the ground and unable to stand.  No discrete falls or significant trauma reported by the wife.   Past Medical History:  Diagnosis Date   BPH (benign prostatic hyperplasia)    Chronic kidney disease    DVT (deep venous thrombosis) ([place]) 2014   right leg, IVC filter   Dysrhythmia 2019   Paroxysmal atrial fibrillation   Hypertension     Patient Active Problem List   Diagnosis Date Noted   Acute respiratory failure with hypoxia ([place]) 03/18/2021   COVID-19 virus infection 03/08/2021   Elevated CK 03/08/2021   BPH (benign prostatic hyperplasia)    CKD (chronic kidney disease), stage IIIa    Hypertension    PAF (paroxysmal atrial fibrillation) ([facility])    Brain mass    Acute metabolic encephalopathy    Hypertensive urgency    Fall at home, initial encounter    GERD (gastroesophageal reflux disease)    HLD (hyperlipidemia)    DVT (deep venous thrombosis) ([place]) 2014    Past Surgical History:  Procedure Laterality Date   ESOPHAGOGASTRODUODENOSCOPY (EGD) WITH PROPOFOL N/A 03/06/2021    Procedure: ESOPHAGOGASTRODUODENOSCOPY (EGD) WITH PROPOFOL;  Surgeon: [name], MD;  Location: [facility] ENDOSCOPY;  Service: Endoscopy;  Laterality: N/A;   TONSILLECTOMY      Prior to Admission medications   Medication Sig Start Date End Date Taking? Authorizing Provider  amLODipine (NORVASC) 2.5 MG tablet Take 2.5 mg by mouth at bedtime.   Yes [provider]  furosemide (LASIX) 20 MG tablet Take 20 mg by mouth daily. 07/06/21  Yes [provider]  olmesartan (BENICAR) 20 MG tablet Take 20 mg by mouth daily.   Yes [provider]  simvastatin (ZOCOR) 20 MG tablet Take 20 mg by mouth at bedtime.   Yes [provider]  terazosin (HYTRIN) 5 MG capsule Take 5 mg by mouth at bedtime.   Yes [provider]  warfarin (COUMADIN) 5 MG tablet Take 5 mg by mouth every evening.   Yes [provider]  acetaminophen (TYLENOL) 500 MG tablet Take 500-1,000 mg by mouth every 6 (six) hours as needed for mild pain or moderate pain.    [provider]  ipratropium (ATROVENT HFA) 17 MCG/ACT inhaler Inhale 2 puffs into the lungs every 4 (four) hours. Patient not taking: Reported on 09/08/2021 03/18/21   Annita Brod, MD  pantoprazole (PROTONIX) 40 MG tablet Take 40 mg by mouth 2 (two) times daily.    [provider]    Allergies Celecoxib  Family History  Problem Relation Age of Onset   Melanoma Brother  Social History Social History   Tobacco Use   Smoking status: Former   Smokeless tobacco: Never  Substance Use Topics   Alcohol use: Never   Drug use: Never    Review of Systems  Unable to be accurately assessed due to patient's altered mentation ____________________________________________   PHYSICAL EXAM:  VITAL SIGNS: Vitals:   09/08/21 1336  BP: (!) 151/57  Pulse: (!) 113  Resp: (!) 35  Temp: (!) 101.4 F (38.6 C)  SpO2: 96%     Constitutional: Alert and disoriented.  Follows simple commands in  all 4 extremities.. Eyes: Conjunctivae are normal. PERRL. EOMI. Head: Atraumatic. Nose: No congestion/rhinnorhea. Mouth/Throat: Mucous membranes are dry.  Oropharynx non-erythematous. Neck: No stridor. No cervical spine tenderness to palpation. Cardiovascular: Tachycardic rate, regular rhythm. Grossly normal heart sounds.  Good peripheral circulation. Respiratory: Tachypneic to about 30.  Occasional wet-sounding cough. Gastrointestinal: Soft , nondistended, nontender to palpation. No CVA tenderness. Musculoskeletal:  No joint effusions. No signs of acute trauma. Neurologic:  No gross focal neurologic deficits are appreciated.  Cranial nerves II through XII intact 5/5 strength and sensation in all 4 extremities Skin:  Skin is warm, dry and intact. No rash noted. Psychiatric: Mood and affect are normal. Speech and behavior are normal.  ____________________________________________   LABS (all labs ordered are listed, but only abnormal results are displayed)  Labs Reviewed  COMPREHENSIVE METABOLIC PANEL - Abnormal; Notable for the following components:      Result Value   Glucose, Bld 109 (*)    BUN 32 (*)    Creatinine, Ser 1.74 (*)    GFR, Estimated 37 (*)    All other components within normal limits  CBC WITH DIFFERENTIAL/PLATELET - Abnormal; Notable for the following components:   WBC 16.2 (*)    RBC 4.09 (*)    Hemoglobin 11.9 (*)    HCT 36.7 (*)    Neutro Abs 14.9 (*)    Lymphs Abs 0.5 (*)    Abs Immature Granulocytes 0.11 (*)    All other components within normal limits  PROTIME-INR - Abnormal; Notable for the following components:   Prothrombin Time 19.2 (*)    INR 1.6 (*)    All other components within normal limits  URINALYSIS, COMPLETE (UACMP) WITH MICROSCOPIC - Abnormal; Notable for the following components:   Color, Urine YELLOW (*)    APPearance CLEAR (*)    Bacteria, UA RARE (*)    All other components within normal limits  RESP PANEL BY RT-PCR (FLU A&B,  COVID) ARPGX2  CULTURE, BLOOD (ROUTINE X 2)  CULTURE, BLOOD (ROUTINE X 2)  URINE CULTURE  LACTIC ACID, PLASMA  APTT  PROCALCITONIN   ____________________________________________  12 Lead EKG  Sinus tachycardia with a rate of 109 bpm.  Normal axis and intervals.  No STEMI. ____________________________________________  RADIOLOGY  ED MD interpretation: 1 view CXR reviewed by me with left basilar infiltrate  CT head reviewed by me with left frontal mass, similar to previous without evidence of acute hemorrhage  Official radiology report(s): CT HEAD WO CONTRAST (5MM)  Result Date: 09/08/2021 CLINICAL DATA:  fall, confusion. on coumadin. eval ich EXAM: CT HEAD WITHOUT CONTRAST TECHNIQUE: Contiguous axial images were obtained from the base of the skull through the vertex without intravenous contrast. COMPARISON:  Mar 08, 2021. FINDINGS: Brain: Similar versus mildly increased size of the large left frontal convexity mass, better characterized on prior MRI. Similar extensive mass effect and edema in the left frontal lobe with approximately 11  mm of subfalcine herniation. No evidence of acute hemorrhage. Similar mass effect and effacement of the ventricles anteriorly without evidence of hydrocephalus. No evidence of acute large vascular territory infarct. Vascular: No hyperdense vessel identified. Intracranial atherosclerosis. Skull: No acute fracture. Sinuses/Orbits: Visualized sinuses are clear. No acute orbital findings. Other: No mastoid effusions. IMPRESSION: 1. The large left frontal extra-axial mass appears similar versus mildly increased in size; however, evaluation is limited on this noncontrast head CT. Similar extensive mass effect and edema in the left frontal lobe with approximately 11 mm of subfalcine herniation. An MRI with contrast could provide more sensitive evaluation for progression if clinically indicated. 2. No evidence of superimposed/interval acute abnormality. Electronically  Signed   By: Margaretha Sheffield M.D.   On: 09/08/2021 15:15   DG Chest Port 1 View  Result Date: 09/08/2021 CLINICAL DATA:  Altered mental status, possible sepsis EXAM: PORTABLE CHEST 1 VIEW COMPARISON:  03/13/2021 FINDINGS: Atherosclerotic calcification of the aortic arch. Thoracic spondylosis. Mild opacity along the left hemidiaphragm favors atelectasis with early pneumonia is a less likely differential diagnostic consideration. Lungs appear otherwise clear. Medial retrocardiac density favors hiatal hernia. IMPRESSION: 1. Mild airspace opacity along the left hemidiaphragm is new from 03/13/2021, and nonspecific for atelectasis versus or early pneumonia. 2. Small hiatal hernia. 3.  Aortic Atherosclerosis (ICD10-I70.0). 4. Thoracic spondylosis. Electronically Signed   By: Van Clines M.D.   On: 09/08/2021 14:35    ____________________________________________   PROCEDURES and INTERVENTIONS  Procedure(s) performed (including Critical Care):  .1-3 Lead EKG Interpretation Performed by: Vladimir Crofts, MD Authorized by: Vladimir Crofts, MD     Interpretation: abnormal     ECG rate:  110   ECG rate assessment: tachycardic     Rhythm: sinus tachycardia     Ectopy: none     Conduction: normal   .Critical Care Performed by: Vladimir Crofts, MD Authorized by: Vladimir Crofts, MD   Critical care provider statement:    Critical care time (minutes):  30   Critical care time was exclusive of:  Separately billable procedures and treating other patients   Critical care was necessary to treat or prevent imminent or life-threatening deterioration of the following conditions:  Sepsis   Critical care was time spent personally by me on the following activities:  Development of treatment plan with patient or surrogate, discussions with consultants, evaluation of patient's response to treatment, examination of patient, ordering and review of laboratory studies, ordering and review of radiographic studies,  ordering and performing treatments and interventions, pulse oximetry, re-evaluation of patient's condition and review of old charts  Medications  lactated ringers infusion (has no administration in time range)  cefTRIAXone (ROCEPHIN) 2 g in sodium chloride 0.9 % 100 mL IVPB (2 g Intravenous New Bag/Given 09/08/21 1517)  azithromycin (ZITHROMAX) 500 mg in sodium chloride 0.9 % 250 mL IVPB (has no administration in time range)  lactated ringers bolus 1,000 mL (0 mLs Intravenous Stopped 09/08/21 1519)  acetaminophen (TYLENOL) tablet 1,000 mg (1,000 mg Oral Given 09/08/21 1405)    ____________________________________________   MDM / ED COURSE   87 year old male presents to the ED encephalopathic, likely precipitated by acute CAP and sepsis, requiring antibiotics and medical admission.  He is febrile and tachycardic, but hemodynamically stable without hypoxia.  He is confused, delirious but without evidence of acute neurologic deficits focally.  Leukocytosis is noted and mild AKI, likely prerenal.  CXR with left basilar infiltrate concerning for CAP and his clinical picture is certainly most concerning for CAP.  CT head obtained due to his weakness and fall, shows a similar meningioma without evidence of an acute superimposed bleed.  Sepsis protocols followed and we will admit to medicine.     ____________________________________________   FINAL CLINICAL IMPRESSION(S) / ED DIAGNOSES  Final diagnoses:  Sepsis with encephalopathy without septic shock, due to unspecified organism [facility])  Community acquired pneumonia of left lower lobe of lung     ED Discharge Orders     None        Marquett Bertoli   Note:  This document was prepared using Systems analyst and may include unintentional dictation errors.    Vladimir Crofts, MD 09/08/21 [phone]

## 2021-09-08 NOTE — Consult Note (Signed)
ANTICOAGULATION CONSULT NOTE - Initial Consult  Pharmacy Consult for warfarin  Indication: atrial fibrillation and DVT  Allergies  Allergen Reactions   Celecoxib Rash    Patient Measurements: Height: 5\' 8"  (172.7 cm) Weight: 86.1 kg (189 lb 13.1 oz) IBW/kg (Calculated) : 68.4  Vital Signs: Temp: 101.4 F (38.6 C) (11/22 1336) Temp Source: Oral (11/22 1336) BP: 119/60 (11/22 1520) Pulse Rate: 100 (11/22 1520)  Labs: Recent Labs    09/08/21 1335  HGB 11.9*  HCT 36.7*  PLT 241  APTT 32  LABPROT 19.2*  INR 1.6*  CREATININE 1.74*    Estimated Creatinine Clearance (by C-G formula based on SCr of 1.74 mg/dL (H)) Male: 25.2 mL/min (A) Male: 30.7 mL/min (A)   Medical History: Past Medical History:  Diagnosis Date   BPH (benign prostatic hyperplasia)    Chronic kidney disease    DVT (deep venous thrombosis) ([place]) 2014   right leg, IVC filter   Dysrhythmia 2019   Paroxysmal atrial fibrillation   Hypertension     Medications:  Scheduled:   Assessment: 87yo male with PMH of BPH, CKD, DVT, PAF, and HTN who was admitted to the hospital for cough and SOB. Patient is on warfarin for hx of DVT and PAF. Pharmacy was consulted for warfarin dosing.   PTA warfarin regimen: 5mg  daily (35mg  weekly)  Last dose 09/07/2021  Drug interactions: azithromycin (increases INR), ceftriaxone (increases INR)  Noted to have supratherpaeutic INR (4.3) on 08/26/2021 from being given one 7.5mg  tablet in place of normal 5mg  tablet   Date/time  INR Interpretation/dose 11/22 1335 1.6 Subthera, 5mg  given  Goal of Therapy:  INR 2-3 Monitor platelets by anticoagulation protocol: Yes   Plan:  Warfarin subtherapeutic, with the addition of interacting medications will expect INR to increase on AM labs Will give warfarin 5mg  once given drug interactions and recent supratherapeutic INR and follow-up with INR on AM labs CBC every 7 days while admitted   Narda Rutherford, PharmD Pharmacy  Resident  09/08/2021 4:16 PM

## 2021-09-08 NOTE — Consult Note (Signed)
CODE SEPSIS - PHARMACY COMMUNICATION  **Broad Spectrum Antibiotics should be administered within 1 hour of Sepsis diagnosis**  Time Code Sepsis Called/Page Received: 1452  Antibiotics Ordered: ceftriaxone and azithromycin  Time of 1st antibiotic administration: 3419  Additional action taken by pharmacy: non  If necessary, Name of Provider/Nurse Contacted: n/a   Oceane Fosse Rodriguez-Guzman PharmD, BCPS 09/08/2021 3:58 PM

## 2021-09-08 NOTE — H&P (Signed)
History and Physical    Alexander Lane:[id number] DOB: 1930/07/22 DOA: 09/08/2021  Referring MD/NP/PA:   PCP: Alexander Aus, MD   Patient coming from:  The patient is coming from home.      Chief Complaint: AMS, cough, SOB, right leg weakness and fall  HPI: Alexander Lane is a 87 y.o. adult with medical history significant of large brain hemangioma, HTN, HLD, GERD, DVT and A. fib on Coumadin, CKD-3a, BPH, who presents with AMS, cough, SOB, right leg weakness and fall.  Patient has altered mental status, cannot provide accurate medical history.  Per his wife (I called his wife by phone), pt has cough and shortness of breath, does not seem to have chest pain.  He has fever with temperature of 101.4 in ED.  No active nausea, vomiting, diarrhea or abdominal pain.  No symptoms of UTI.  Initially patient was not confused, but later on pt developed confusion. He looks very weak.  Per his wife, patient normally is orientated x3, can use walker to walk.  This morning patient developed right leg weakness, cannot move right leg normally.  No facial droop or slurred speech. Per his wife, pt slided out the bed, but not have significant fall or injury.   ED Course: pt was found to have WBC 16.2, lactic acid of 1.8, INR 1.6, negative urinalysis, negative COVID PCR, negative flu, worsening renal function, blood pressure 119/60, heart rate of 113, RR 35, oxygen saturation 95% on room air.  Chest x-ray showed left-sided opacity.  Patient is admitted to [facility] bed as inpatient.  CT scan of head: 1. The large left frontal extra-axial mass appears similar versus mildly increased in size; however, evaluation is limited on this noncontrast head CT. Similar extensive mass effect and edema in the left frontal lobe with approximately 11 mm of subfalcine herniation. An MRI with contrast could provide more sensitive evaluation for progression if clinically indicated. 2. No evidence of  superimposed/interval acute abnormality.  Review of Systems: Could not be reviewed accurately due to altered mental status.  Allergy:  Allergies  Allergen Reactions   Celecoxib Rash    Past Medical History:  Diagnosis Date   BPH (benign prostatic hyperplasia)    Chronic kidney disease    DVT (deep venous thrombosis) ([place]) 2014   right leg, IVC filter   Dysrhythmia 2019   Paroxysmal atrial fibrillation   Hypertension     Past Surgical History:  Procedure Laterality Date   ESOPHAGOGASTRODUODENOSCOPY (EGD) WITH PROPOFOL N/A 03/06/2021   Procedure: ESOPHAGOGASTRODUODENOSCOPY (EGD) WITH PROPOFOL;  Surgeon: [name], MD;  Location: [facility] ENDOSCOPY;  Service: Endoscopy;  Laterality: N/A;   TONSILLECTOMY      Social History:  reports that he has quit smoking. He has never used smokeless tobacco. He reports that he does not drink alcohol and does not use drugs.  Family History:  Family History  Problem Relation Age of Onset   Melanoma Brother      Prior to Admission medications   Medication Sig Start Date End Date Taking? Authorizing Provider  amLODipine (NORVASC) 2.5 MG tablet Take 2.5 mg by mouth at bedtime.   Yes [provider]  furosemide (LASIX) 20 MG tablet Take 20 mg by mouth daily. 07/06/21  Yes [provider]  olmesartan (BENICAR) 20 MG tablet Take 20 mg by mouth daily.   Yes [provider]  simvastatin (ZOCOR) 20 MG tablet Take 20 mg by mouth at bedtime.   Yes [provider]  terazosin (HYTRIN) 5 MG capsule Take 5 mg by mouth at bedtime.   Yes [provider]  warfarin (COUMADIN) 5 MG tablet Take 5 mg by mouth every evening.   Yes [provider]  acetaminophen (TYLENOL) 500 MG tablet Take 500-1,000 mg by mouth every 6 (six) hours as needed for mild pain or moderate pain.    [provider]  ipratropium (ATROVENT HFA) 17 MCG/ACT inhaler Inhale 2 puffs into the lungs every 4 (four) hours. Patient  not taking: Reported on 09/08/2021 03/18/21   Annita Brod, MD  pantoprazole (PROTONIX) 40 MG tablet Take 40 mg by mouth 2 (two) times daily.    [provider]    Physical Exam: Vitals:   09/08/21 1336 09/08/21 1337 09/08/21 1520 09/08/21 1630  BP: (!) 151/57  119/60   Pulse: (!) 113  100   Resp: (!) 35  16   Temp: (!) 101.4 F (38.6 C)   98.8 F (37.1 C)  TempSrc: Oral   Oral  SpO2: 96%  95%   Weight:  86.1 kg    Height:  5\' 8"  (1.727 m)     General: Not in acute distress HEENT:       Eyes: PERRL, EOMI, no scleral icterus.       ENT: No discharge from the ears and nose       Neck: No JVD, no bruit, no mass felt. Heme: No neck lymph node enlargement. Cardiac: S1/S2, RRR, No murmurs, No gallops or rubs. Respiratory: has coarse breathing sound bilaterally GI: Soft, nondistended, nontender, no organomegaly, BS present. GU: No hematuria Ext: No pitting leg edema bilaterally. 1+DP/PT pulse bilaterally. Musculoskeletal: No joint deformities, No joint redness or warmth, no limitation of ROM in spin. Skin: No rashes.  Neuro: Confused, knows his own name, knows that he is in hospital, but not orientated to time.  Partially following command.  Cranial nerves II-XII grossly intact, has left weakness on examination.  Psych: Patient is not psychotic, no suicidal or hemocidal ideation.  Labs on Admission: I have personally reviewed following labs and imaging studies  CBC: Recent Labs  Lab 09/08/21 1335  WBC 16.2*  NEUTROABS 14.9*  HGB 11.9*  HCT 36.7*  MCV 89.7  PLT 202   Basic Metabolic Panel: Recent Labs  Lab 09/08/21 1335  NA 141  K 3.9  CL 103  CO2 31  GLUCOSE 109*  BUN 32*  CREATININE 1.74*  CALCIUM 9.3   GFR: Estimated Creatinine Clearance (by C-G formula based on SCr of 1.74 mg/dL (H)) Male: 25.2 mL/min (A) Male: 30.7 mL/min (A) Liver Function Tests: Recent Labs  Lab 09/08/21 1335  AST 19  ALT 9  ALKPHOS 50  BILITOT 0.8  PROT 6.7   ALBUMIN 4.0   No results for input(s): LIPASE, AMYLASE in the last 168 hours. No results for input(s): AMMONIA in the last 168 hours. Coagulation Profile: Recent Labs  Lab 09/08/21 1335  INR 1.6*   Cardiac Enzymes: No results for input(s): CKTOTAL, CKMB, CKMBINDEX, TROPONINI in the last 168 hours. BNP (last 3 results) No results for input(s): PROBNP in the last 8760 hours. HbA1C: No results for input(s): HGBA1C in the last 72 hours. CBG: No results for input(s): GLUCAP in the last 168 hours. Lipid Profile: No results for input(s): CHOL, HDL, LDLCALC, TRIG, CHOLHDL, LDLDIRECT in the last 72 hours. Thyroid Function Tests: No results for input(s): TSH, T4TOTAL, FREET4, T3FREE, THYROIDAB in the last 72 hours. Anemia Panel: No  results for input(s): VITAMINB12, FOLATE, FERRITIN, TIBC, IRON, RETICCTPCT in the last 72 hours. Urine analysis:    Component Value Date/Time   COLORURINE YELLOW (A) 09/08/2021 1335   APPEARANCEUR CLEAR (A) 09/08/2021 1335   LABSPEC 1.010 09/08/2021 1335   PHURINE 5.0 09/08/2021 1335   GLUCOSEU NEGATIVE 09/08/2021 1335   HGBUR NEGATIVE 09/08/2021 1335   BILIRUBINUR NEGATIVE 09/08/2021 1335   KETONESUR NEGATIVE 09/08/2021 1335   PROTEINUR NEGATIVE 09/08/2021 1335   NITRITE NEGATIVE 09/08/2021 1335   LEUKOCYTESUR NEGATIVE 09/08/2021 1335   Sepsis Labs: @LABRCNTIP (procalcitonin:4,lacticidven:4) ) Recent Results (from the past 240 hour(s))  Resp Panel by RT-PCR (Flu A&B, Covid) Nasopharyngeal Swab     Status: None   Collection Time: 09/08/21  1:35 PM   Specimen: Nasopharyngeal Swab; Nasopharyngeal(NP) swabs in vial transport medium  Result Value Ref Range Status   SARS Coronavirus 2 by RT PCR NEGATIVE NEGATIVE Final    Comment: (NOTE) SARS-CoV-2 target nucleic acids are NOT DETECTED.  The SARS-CoV-2 RNA is generally detectable in upper respiratory specimens during the acute phase of infection. The lowest concentration of SARS-CoV-2 viral copies  this assay can detect is 138 copies/mL. A negative result does not preclude SARS-Cov-2 infection and should not be used as the sole basis for treatment or other patient management decisions. A negative result may occur with  improper specimen collection/handling, submission of specimen other than nasopharyngeal swab, presence of viral mutation(s) within the areas targeted by this assay, and inadequate number of viral copies(<138 copies/mL). A negative result must be combined with clinical observations, patient history, and epidemiological information. The expected result is Negative.  Fact Sheet for Patients:  EntrepreneurPulse.com.au  Fact Sheet for Healthcare Providers:  IncredibleEmployment.be  This test is no t yet approved or cleared by the [place] FDA and  has been authorized for detection and/or diagnosis of SARS-CoV-2 by FDA under an Emergency Use Authorization (EUA). This EUA will remain  in effect (meaning this test can be used) for the duration of the COVID-19 declaration under Section 564(b)(1) of the Act, 21 U.S.C.section 360bbb-3(b)(1), unless the authorization is terminated  or revoked sooner.       Influenza A by PCR NEGATIVE NEGATIVE Final   Influenza B by PCR NEGATIVE NEGATIVE Final    Comment: (NOTE) The Xpert Xpress SARS-CoV-2/FLU/RSV plus assay is intended as an aid in the diagnosis of influenza from Nasopharyngeal swab specimens and should not be used as a sole basis for treatment. Nasal washings and aspirates are unacceptable for Xpert Xpress SARS-CoV-2/FLU/RSV testing.  Fact Sheet for Patients: EntrepreneurPulse.com.au  Fact Sheet for Healthcare Providers: IncredibleEmployment.be  This test is not yet approved or cleared by the [place] FDA and has been authorized for detection and/or diagnosis of SARS-CoV-2 by FDA under an Emergency Use Authorization (EUA). This EUA  will remain in effect (meaning this test can be used) for the duration of the COVID-19 declaration under Section 564(b)(1) of the Act, 21 U.S.C. section 360bbb-3(b)(1), unless the authorization is terminated or revoked.  Performed at [facility], [address], [place], [place] [address]      Radiological Exams on Admission: CT HEAD WO CONTRAST (5MM)  Result Date: 09/08/2021 CLINICAL DATA:  fall, confusion. on coumadin. eval ich EXAM: CT HEAD WITHOUT CONTRAST TECHNIQUE: Contiguous axial images were obtained from the base of the skull through the vertex without intravenous contrast. COMPARISON:  Mar 08, 2021. FINDINGS: Brain: Similar versus mildly increased size of the large left frontal convexity mass, better characterized on  prior MRI. Similar extensive mass effect and edema in the left frontal lobe with approximately 11 mm of subfalcine herniation. No evidence of acute hemorrhage. Similar mass effect and effacement of the ventricles anteriorly without evidence of hydrocephalus. No evidence of acute large vascular territory infarct. Vascular: No hyperdense vessel identified. Intracranial atherosclerosis. Skull: No acute fracture. Sinuses/Orbits: Visualized sinuses are clear. No acute orbital findings. Other: No mastoid effusions. IMPRESSION: 1. The large left frontal extra-axial mass appears similar versus mildly increased in size; however, evaluation is limited on this noncontrast head CT. Similar extensive mass effect and edema in the left frontal lobe with approximately 11 mm of subfalcine herniation. An MRI with contrast could provide more sensitive evaluation for progression if clinically indicated. 2. No evidence of superimposed/interval acute abnormality. Electronically Signed   By: Margaretha Sheffield M.D.   On: 09/08/2021 15:15   DG Chest Port 1 View  Result Date: 09/08/2021 CLINICAL DATA:  Altered mental status, possible sepsis EXAM: PORTABLE CHEST 1 VIEW COMPARISON:   03/13/2021 FINDINGS: Atherosclerotic calcification of the aortic arch. Thoracic spondylosis. Mild opacity along the left hemidiaphragm favors atelectasis with early pneumonia is a less likely differential diagnostic consideration. Lungs appear otherwise clear. Medial retrocardiac density favors hiatal hernia. IMPRESSION: 1. Mild airspace opacity along the left hemidiaphragm is new from 03/13/2021, and nonspecific for atelectasis versus or early pneumonia. 2. Small hiatal hernia. 3.  Aortic Atherosclerosis (ICD10-I70.0). 4. Thoracic spondylosis. Electronically Signed   By: Van Clines M.D.   On: 09/08/2021 14:35     EKG: I have personally reviewed.  Seems to be sinus rhythm, QTC 443, tachycardia with heart rate of 109, early R wave progression    Assessment/Plan Principal Problem:   CAP (community acquired pneumonia) Active Problems:   BPH (benign prostatic hyperplasia)   Acute renal failure superimposed on stage 3a chronic kidney disease (HCC)   DVT (deep venous thrombosis) (HCC)   Hypertension   PAF (paroxysmal atrial fibrillation) (HCC)   H/O meningioma of the brain   Acute metabolic encephalopathy   Fall at home, initial encounter   HLD (hyperlipidemia)   Sepsis ([place])   Right leg weakness  Sepsis due CAP (community acquired pneumonia): Patient meets criteria for sepsis with WBC 16.2, fever 101.4, tachycardia with heart rates of 113, tachypnea with RR 35.  Lactic acid is normal 1.8.  - Will admit to med-surg bed as inpt - IV Rocephin and azithromycin - Mucinex for cough  - Bronchodilators - Urine legionella and S. pneumococcal antigen - Follow up blood culture x2, sputum culture - will get Procalcitonin and trend lactic acid level per sepsis protocol - IVF: 2L of LR bolus in ED, followed by 75 mL per hour of NS   BPH (benign prostatic hyperplasia) -Terazosin  Acute renal failure superimposed on stage 3a chronic kidney disease ([place]): Baseline creatinine 1.0-1.3.  His  creatinine is 1.74, BUN 32 today -IVF as above -Hold Lasix, Benicar  DVT (deep venous thrombosis) (HCC) -coumadin per pharm  PAF (paroxysmal atrial fibrillation) ([place]): -coumadin -prn Metoprolol 2.5 mg q2h for HR > 125  Hypertension -IV hydralazine as needed -Hold amlodipine, Lasix, Benicar, since patient is at high risk of developing hypotension due to sepsis  Acute metabolic encephalopathy: Most likely due to sepsis -Frequent neurochecks  Fall at home, initial encounter -PT/OT -TOC consult  HLD (hyperlipidemia) -Zocor  Right leg weakness and H/O meningioma of the brain: pt was found to have a large brain meningioma in previous admission in May. Neurosurgeon was consulted  that time. Per discharge summery, "no need for inpatient intervention.  Keppra stopped as per recommendation and to continue steroids for at least 5 to 7 days.  Weaned off upon discharge". Pt has right leg weakness today, which is likely due his left large brain meningioma. I discussed with patient's wife about this issue.  His wife does not want patient to do surgery since she does not think patient can survive the surgery. -Will start Keppra to 250 mg twice daily for seizure prevention -Decadron 4 mg every 6 hours -Palliative consult    DVT ppx: on coumadin Code Status: Full code (I discussed with the patient's wife about CODE STATUS on the phone, and explained the meaning of CODE STATUS.  Per his wife, she needs to read their documentation before she can tell me patient's will.  She will let us know tomorrow).  Will order full code temporarily today  Family Communication:   Yes, patient's  wife by phone Disposition Plan:  Anticipate discharge back to previous environment Consults called:  none Admission status and Level of care: Telemetry Medical:    as inpt         Status is: Inpatient  Remains inpatient appropriate because:              Date of Service 09/08/2021    [facility]   If 7PM-7AM, please contact night-coverage [email or web address] 09/08/2021, 6:28 PM

## 2021-09-09 DIAGNOSIS — J189 Pneumonia, unspecified organism: Secondary | ICD-10-CM | POA: Diagnosis not present

## 2021-09-09 DIAGNOSIS — R7881 Bacteremia: Secondary | ICD-10-CM | POA: Diagnosis not present

## 2021-09-09 DIAGNOSIS — N179 Acute kidney failure, unspecified: Secondary | ICD-10-CM | POA: Diagnosis not present

## 2021-09-09 DIAGNOSIS — G9341 Metabolic encephalopathy: Secondary | ICD-10-CM | POA: Diagnosis not present

## 2021-09-09 LAB — BLOOD CULTURE ID PANEL (REFLEXED) - BCID2

## 2021-09-09 LAB — BASIC METABOLIC PANEL
Anion gap: 6 (ref 5–15)
BUN: 32 mg/dL — ABNORMAL HIGH (ref 8–23)
CO2: 28 mmol/L (ref 22–32)
Calcium: 8.5 mg/dL — ABNORMAL LOW (ref 8.9–10.3)
Chloride: 105 mmol/L (ref 98–111)
Creatinine, Ser: 1.54 mg/dL — ABNORMAL HIGH (ref 0.61–1.24)
GFR, Estimated: 43 mL/min — ABNORMAL LOW (ref >60)
Glucose, Bld: 118 mg/dL — ABNORMAL HIGH (ref 70–99)
Potassium: 4.1 mmol/L (ref 3.5–5.1)
Sodium: 139 mmol/L (ref 135–145)

## 2021-09-09 LAB — PROTIME-INR
INR: 1.7 — ABNORMAL HIGH (ref 0.8–1.2)
Prothrombin Time: 19.7 seconds — ABNORMAL HIGH (ref 11.4–15.2)

## 2021-09-09 LAB — CBC
HCT: 31.7 % — ABNORMAL LOW (ref 39.0–52.0)
Hemoglobin: 10.4 g/dL — ABNORMAL LOW (ref 13.0–17.0)
MCH: 29.4 pg (ref 26.0–34.0)
MCHC: 32.8 g/dL (ref 30.0–36.0)
MCV: 89.5 fL (ref 80.0–100.0)
Platelets: 200 10*3/uL (ref 150–400)
RBC: 3.54 MIL/uL — ABNORMAL LOW (ref 4.22–5.81)
RDW: 13.1 % (ref 11.5–15.5)
WBC: 19.2 10*3/uL — ABNORMAL HIGH (ref 4.0–10.5)
nRBC: 0 % (ref 0.0–0.2)

## 2021-09-09 LAB — PROCALCITONIN: Procalcitonin: 2.22 ng/mL

## 2021-09-09 MED ORDER — WARFARIN SODIUM 5 MG PO TABS
5.0000 mg | ORAL_TABLET | Freq: Once | ORAL | Status: AC
  Administered 2021-09-09: 16:00:00 5 mg via ORAL
  Filled 2021-09-09: qty 1

## 2021-09-09 NOTE — Progress Notes (Signed)
Provided presence, support and prayer for this patient and family bedside.

## 2021-09-09 NOTE — Assessment & Plan Note (Addendum)
-   Resume ARB - Continue PRN hydralazine

## 2021-09-09 NOTE — Progress Notes (Signed)
Progress Note    Alexander Lane   XBD:[id number]  DOB: [birth year]  DOA: 09/08/2021     1 PCP: Rusty Aus, MD  Initial CC: weakness, falling, cough  Hospital Course: Alexander Lane is an 87 yo male with PMH  large brain hemangioma, HTN, HLD, GERD, DVT and A. fib on Coumadin, CKD-3a, BPH, who presented with AMS, cough, SOB, right leg weakness and fall.   He was noted to be febrile on arrival to the ER, 101.4; he was also reported to have progressive worsening weakness at home per his wife and worsened mentation. He underwent work-up on admission.  CT head showed his known large left frontal extra-axial mass and appeared essentially similar in size to prior imaging with underlying mass-effect and edema involving left frontal lobe. CXR showed a mild airspace opacity involving the left hemidiaphragm new from prior imaging concerning for possible pneumonia. Blood cultures were obtained on admission and became positive in 3/4 bottles for strep pneumonia.  Interval History:  Resting in bed in no distress but appears grossly deconditioned and weak.  Endorses underlying cough which he states has been going on for approximately 1 month with no improvement.  Assessment & Plan: * CAP (community acquired pneumonia) - Small airspace opacity noted along left hemidiaphragm on CXR.  Given strep pneumonia bacteremia, pulmonary source may be culprit -Continue Rocephin - Follow-up urinary strep pneumo antigen and Legionella -Trend procalcitonin  Severe sepsis (HCC) - Febrile, tachycardia, tachypnea, leukocytosis, altered mentation; source presumed pulmonary along with seeding of blood -Repeat blood cultures on 09/10/2021 - Continue Rocephin - See pneumonia  Bacteremia due to Gram-positive bacteria - 3/4 bottles from 09/08/21 growing Strep pna; for now suspecting translocation from presumed PNA - repeat blood cultures on 11/24; if still positive would need further workup with echo  - continue  rocephin   Right leg weakness - Likely multifactorial from underlying meningioma and overall deconditioning from underlying bacteremia - PT/OT eval's  Acute metabolic encephalopathy - patient symptoms include AMS, weakness - etiology considered due to metabolic derangements likely in setting of underlying infections  H/O meningioma of the brain - mostly same size on CTH; no surgical intervention when last seen by neurosurgery (wife decline surgical intervention) - given CTH findings, patient started back on keppra and decadron this admission   Acute renal failure superimposed on stage 3a chronic kidney disease ([place]) - patient has history of CKD3a. Baseline creat ~ 1 - 1.3.  - patient presents with increase in creat >0.3 mg/dL above baseline, creat increase >1.5x baseline presumed to have occurred within past 7 days PTA - creatinine 1.7 on admission - continue IVF - holding ARB and lasix    PAF (paroxysmal atrial fibrillation) (HCC) - continue coumadin per pharmacy - lopressor PRN for rate control   Fall at home, initial encounter - see weakness  HLD (hyperlipidemia) - continue zocor  Hypertension - Home meds on hold - Continue PRN hydralazine   BPH (benign prostatic hyperplasia) - Continue terazosin    Old records reviewed in assessment of this patient  Antimicrobials: Azithromycin 09/08/21  x 1 Rocephin 11/22 >> current   DVT prophylaxis: Coumadin  Code Status:   Code Status: Full Code  Disposition Plan:   Status is: Inpatient    Objective: Blood pressure 127/66, pulse 72, temperature 98.4 F (36.9 C), resp. rate 19, height 5\' 8"  (1.727 m), weight 86.1 kg, SpO2 93 %.  Examination:  Physical Exam Constitutional:      Comments: Weak, deconditioned appearing,  but NAD  HENT:     Head: Normocephalic and atraumatic.     Mouth/Throat:     Mouth: Mucous membranes are moist.  Eyes:     Extraocular Movements: Extraocular movements intact.  Cardiovascular:      Rate and Rhythm: Normal rate and regular rhythm.  Pulmonary:     Effort: Pulmonary effort is normal. No respiratory distress.     Breath sounds: Rhonchi present.  Abdominal:     General: Bowel sounds are normal. There is no distension.     Palpations: Abdomen is soft.     Tenderness: There is no abdominal tenderness.  Musculoskeletal:        General: Normal range of motion.     Cervical back: Normal range of motion and neck supple. No rigidity or tenderness.  Skin:    General: Skin is warm and dry.  Neurological:     General: No focal deficit present.  Psychiatric:        Mood and Affect: Mood normal.        Behavior: Behavior normal.     Consultants:    Procedures:    Data Reviewed: I have personally reviewed labs and imaging studies     LOS: 1 day  Time spent: Greater than 50% of the 35 minute visit was spent in counseling/coordination of care for the patient as laid out in the A&P.   Dwyane Dee, MD [facility] 09/09/2021, 1:58 PM

## 2021-09-09 NOTE — Assessment & Plan Note (Addendum)
-   patient has history of CKD3a. Baseline creat ~ 1 - 1.3.  - patient presents with increase in creat >0.3 mg/dL above baseline, creat increase >1.5x baseline presumed to have occurred within past 7 days PTA - creatinine 1.7 on admission - s/p IVF - resume ARB and lasix on 11/27 - creat back to baseline

## 2021-09-09 NOTE — Assessment & Plan Note (Addendum)
-   3/4 bottles from 09/08/21 growing Strep pna; for now suspecting translocation from presumed PNA - repeat blood cultures ;on 11/24; negative x 1 day - s/p rocephin - d/c rocephin and start amoxicillin; goal 10 days total course (end date 09/17/21); due to dysphagia and aspiration risk from MBSS, will change to ampicillin for remainder of course

## 2021-09-09 NOTE — Consult Note (Signed)
ANTICOAGULATION CONSULT NOTE - Initial Consult  Pharmacy Consult for warfarin  Indication: atrial fibrillation and DVT  Allergies  Allergen Reactions   Celecoxib Rash    Patient Measurements: Height: 5\' 8"  (172.7 cm) Weight: 86.1 kg (189 lb 13.1 oz) IBW/kg (Calculated) : 68.4  Vital Signs:    Labs: Recent Labs    09/08/21 1335 09/09/21 0301  HGB 11.9* 10.4*  HCT 36.7* 31.7*  PLT 241 200  APTT 32  --   LABPROT 19.2* 19.7*  INR 1.6* 1.7*  CREATININE 1.74* 1.54*     Estimated Creatinine Clearance (by C-G formula based on SCr of 1.54 mg/dL (H)) Male: 28.5 mL/min (A) Male: 34.7 mL/min (A)   Medical History: Past Medical History:  Diagnosis Date   BPH (benign prostatic hyperplasia)    Chronic kidney disease    DVT (deep venous thrombosis) ([place]) 2014   right leg, IVC filter   Dysrhythmia 2019   Paroxysmal atrial fibrillation   Hypertension     Medications:  Scheduled:   dexamethasone  4 mg Oral Q6H   pantoprazole  40 mg Oral BID   simvastatin  20 mg Oral q1800   terazosin  5 mg Oral QHS   Warfarin - Pharmacist Dosing Inpatient   Does not apply q1600    Assessment: 87yo male with PMH of BPH, CKD, DVT, PAF, and HTN who was admitted to the hospital for cough and SOB. Patient is on warfarin for hx of DVT and PAF. Pharmacy was consulted for warfarin dosing.   PTA warfarin regimen: 5mg  daily (35mg  weekly) Last dose 09/07/2021  Noted to have supratherpaeutic INR (4.3) on 08/26/2021 from being given one 7.5mg  tablet in place of normal 5mg  tablet   Drug interactions: ceftriaxone (increases INR)  Date/time  INR Interpretation/dose 11/22 1335 1.6 Subthera, 5 mg given 11/23  0301 1.7 Subthera, 5 mg given  Goal of Therapy:  INR 2-3 Monitor platelets by anticoagulation protocol: Yes   Plan:  Warfarin subtherapeutic, with the addition of interacting medications will expect INR to increase on AM labs. Will give warfarin 5mg  once given drug interactions and  recent supratherapeutic INR and follow-up with INR on AM labs CBC at least every 7 days while admitted   Darnelle Bos, PharmD 09/09/2021 7:28 AM

## 2021-09-09 NOTE — Evaluation (Signed)
Physical Therapy Evaluation Patient Details Name: Alexander Lane MRN: [id number] DOB: Jul 06, 1930 Today's Date: 09/09/2021  History of Present Illness  Pt is a 87 y.o. adult with medical history significant of large brain meningioma, HTN, HLD, GERD, DVT and A. fib on Coumadin, CKD-3a, BPH, who presents with AMS, cough, SOB, right leg weakness and fall. MD assessment includes: community aquired pneumonia, sepsis, fall at home, and acute metabolic encephalopathy.   Clinical Impression  Pt was pleasant and agreeable to participate during the session and put forth good effort throughout. Pt had difficulty confirming history and no family was present to confirm information or cognitive status at baseline. Pt had difficulty following multi modal cuing for ther ex in bed. Pt required min guard for safety with bed mobility and did not report feeling nauseous, dizzy, or lightheaded upon sitting EOB. Pt needed mod assist to stand from EOB due to generalized BLE weakness R>L. Pt had great difficulty performing marching in place while holding onto RW and R foot could not clear the surface of the floor. Pt demonstrated limited ability to WB trough RLE. Pt tried taking steps forward/backward and laterally at the EOB but R foot could not clear surface and pt dragged R foot. Pt will benefit from PT services in a SNF setting upon discharge to safely address deficits listed in patient problem list for decreased caregiver assistance and eventual return to PLOF.   Recommendations for follow up therapy are one component of a multi-disciplinary discharge planning process, led by the attending physician.  Recommendations may be updated based on patient status, additional functional criteria and insurance authorization.  Follow Up Recommendations Skilled nursing-short term rehab (<3 hours/day)    Assistance Recommended at Discharge Frequent or constant Supervision/Assistance  Functional Status Assessment Patient has had  a recent decline in their functional status and demonstrates the ability to make significant improvements in function in a reasonable and predictable amount of time.  Equipment Recommendations  None recommended by PT    Recommendations for Other Services       Precautions / Restrictions Precautions Precautions: Fall Restrictions Weight Bearing Restrictions: No      Mobility  Bed Mobility Overal bed mobility: Needs Assistance Bed Mobility: Supine to Sit;Sit to Supine     Supine to sit: Min guard Sit to supine: Min guard   General bed mobility comments: Increased time and effort, min guard for safety    Transfers Overall transfer level: Needs assistance Equipment used: Rolling walker (2 wheels) Transfers: Sit to/from Stand Sit to Stand: Mod assist           General transfer comment: Increased time and effort, required mod assist due to generalized BLE weakness R>L    Ambulation/Gait Ambulation/Gait assistance: Min assist Gait Distance (Feet): 3 Feet Assistive device: Rolling walker (2 wheels) Gait Pattern/deviations: Step-to pattern;Decreased step length - right;Decreased step length - left;Decreased stride length;Decreased weight shift to right Gait velocity: decreased     General Gait Details: Pt was unable to put much weight through RLE, dragged RLE during ambulation  Stairs            Wheelchair Mobility    Modified Rankin (Stroke Patients Only)       Balance Overall balance assessment: Needs assistance Sitting-balance support: Bilateral upper extremity supported;Feet supported Sitting balance-Leahy Scale: Good     Standing balance support: Bilateral upper extremity supported;During functional activity Standing balance-Leahy Scale: Fair Standing balance comment: Could not march in place, could not clear floor surface with RLE  Pertinent Vitals/Pain Pain Assessment: No/denies pain    Home Living  Family/patient expects to be discharged to:: Private residence Living Arrangements: Spouse/significant other (Wife) Available Help at Discharge: Family;Available PRN/intermittently (Wife) Type of Home: House Home Access: Stairs to enter Entrance Stairs-Rails: None Entrance Stairs-Number of Steps: 7 Alternate Level Stairs-Number of Steps: flight Home Layout: Two level;Bed/bath upstairs Home Equipment: Cane - quad;Cane - single point;Grab bars - tub/shower;Rolling Walker (2 wheels)      Prior Function Prior Level of Function : Independent/Modified Independent             Mobility Comments: Pt reported being independent and no use of AD for mobility, fallen once in the past 6 months ADLs Comments: Pt reported being independent with ADLs     Hand Dominance        Extremity/Trunk Assessment   Upper Extremity Assessment Upper Extremity Assessment: Generalized weakness    Lower Extremity Assessment Lower Extremity Assessment: Generalized weakness;RLE deficits/detail RLE Deficits / Details: DF on RLE was 3-/5, unable to clear surface of floor during ambulation, dragged RLE       Communication   Communication: HOH  Cognition Arousal/Alertness: Awake/alert Behavior During Therapy: WFL for tasks assessed/performed Overall Cognitive Status: No family/caregiver present to determine baseline cognitive functioning                                 General Comments: Pt had difficulty giving history and no family was there to confirm information        General Comments      Exercises Total Joint Exercises Ankle Circles/Pumps: AROM;Strengthening;Both;10 reps;Supine Quad Sets: AROM;Both;5 reps;Supine Long Arc Quad: AROM;Strengthening;Both;10 reps;Seated Marching in Standing: AROM;5 reps;Both;Standing   Assessment/Plan    PT Assessment Patient needs continued PT services  PT Problem List Decreased strength;Decreased range of motion;Decreased activity  tolerance;Decreased balance;Decreased mobility;Decreased knowledge of use of DME       PT Treatment Interventions DME instruction;Gait training;Stair training;Functional mobility training;Therapeutic activities;Therapeutic exercise;Balance training;Patient/family education    PT Goals (Current goals can be found in the Care Plan section)  Acute Rehab PT Goals Patient Stated Goal: be able to walk PT Goal Formulation: With patient Time For Goal Achievement: 09/22/21 Potential to Achieve Goals: Fair    Frequency Min 2X/week   Barriers to discharge        Co-evaluation               AM-PAC PT "6 Clicks" Mobility  Outcome Measure Help needed turning from your back to your side while in a flat bed without using bedrails?: A Little Help needed moving from lying on your back to sitting on the side of a flat bed without using bedrails?: A Little Help needed moving to and from a bed to a chair (including a wheelchair)?: A Lot Help needed standing up from a chair using your arms (e.g., wheelchair or bedside chair)?: A Little Help needed to walk in hospital room?: A Lot Help needed climbing 3-5 steps with a railing? : Total 6 Click Score: 14    End of Session Equipment Utilized During Treatment: Gait belt Activity Tolerance: Patient tolerated treatment well Patient left: in bed;with call bell/phone within reach;with bed alarm set Nurse Communication: Mobility status PT Visit Diagnosis: Unsteadiness on feet (R26.81);Muscle weakness (generalized) (M62.81);History of falling (Z91.81);Difficulty in walking, not elsewhere classified (R26.2)    Time: 5188-4166 PT Time Calculation (min) (ACUTE ONLY): 44 min   Charges:  Sheldon Silvan SPT 09/09/21, 1:36 PM

## 2021-09-09 NOTE — Assessment & Plan Note (Addendum)
-   Small airspace opacity noted along left hemidiaphragm on CXR.  Given strep pneumonia bacteremia, pulmonary source may be culprit - s/p Rocephin; sens reviewed; will de-escalate to amoxicillin on 11/25 to complete course  - Follow-up urinary strep pneumo antigen and Legionella -Trend procalcitonin (0.23>>2.22>>1.76)

## 2021-09-09 NOTE — Assessment & Plan Note (Signed)
-   patient symptoms include AMS, weakness - etiology considered due to metabolic derangements likely in setting of underlying infections

## 2021-09-09 NOTE — Assessment & Plan Note (Signed)
-   continue zocor

## 2021-09-09 NOTE — Progress Notes (Signed)
PHARMACY - PHYSICIAN COMMUNICATION CRITICAL VALUE ALERT - BLOOD CULTURE IDENTIFICATION (BCID)  Alexander Lane is an 87 y.o. adult who presented to [facility] on 09/08/2021 with a chief complaint of CAP  Assessment:  Strep pneumo in 3 of 4 bottles, no resistance detected.  (include suspected source if known)  Name of physician (or Provider) Contacted: Rachael Fee  Current antibiotics: Ceftriaxone 2 gm IV Q24H and Azithromycin 500 mg IV Q24H   Changes to prescribed antibiotics recommended:  Recommendations accepted by provider Will d/c azithromycin and continue with ceftriaxone 2 gm IV Q24H.   Results for orders placed or performed during the hospital encounter of 09/08/21  Blood Culture ID Panel (Reflexed) (Collected: 09/08/2021  1:35 PM)  Result Value Ref Range   Enterococcus faecalis NOT DETECTED NOT DETECTED   Enterococcus Faecium NOT DETECTED NOT DETECTED   Listeria monocytogenes NOT DETECTED NOT DETECTED   Staphylococcus species NOT DETECTED NOT DETECTED   Staphylococcus aureus (BCID) NOT DETECTED NOT DETECTED   Staphylococcus epidermidis NOT DETECTED NOT DETECTED   Staphylococcus lugdunensis NOT DETECTED NOT DETECTED   Streptococcus species DETECTED (A) NOT DETECTED   Streptococcus agalactiae NOT DETECTED NOT DETECTED   Streptococcus pneumoniae DETECTED (A) NOT DETECTED   Streptococcus pyogenes NOT DETECTED NOT DETECTED   A.calcoaceticus-baumannii NOT DETECTED NOT DETECTED   Bacteroides fragilis NOT DETECTED NOT DETECTED   Enterobacterales NOT DETECTED NOT DETECTED   Enterobacter cloacae complex NOT DETECTED NOT DETECTED   Escherichia coli NOT DETECTED NOT DETECTED   Klebsiella aerogenes NOT DETECTED NOT DETECTED   Klebsiella oxytoca NOT DETECTED NOT DETECTED   Klebsiella pneumoniae NOT DETECTED NOT DETECTED   Proteus species NOT DETECTED NOT DETECTED   Salmonella species NOT DETECTED NOT DETECTED   Serratia marcescens NOT DETECTED NOT DETECTED   Haemophilus influenzae  NOT DETECTED NOT DETECTED   Neisseria meningitidis NOT DETECTED NOT DETECTED   Pseudomonas aeruginosa NOT DETECTED NOT DETECTED   Stenotrophomonas maltophilia NOT DETECTED NOT DETECTED   Candida albicans NOT DETECTED NOT DETECTED   Candida auris NOT DETECTED NOT DETECTED   Candida glabrata NOT DETECTED NOT DETECTED   Candida krusei NOT DETECTED NOT DETECTED   Candida parapsilosis NOT DETECTED NOT DETECTED   Candida tropicalis NOT DETECTED NOT DETECTED   Cryptococcus neoformans/gattii NOT DETECTED NOT DETECTED    Alexander Lane D 09/09/2021  3:57 AM

## 2021-09-09 NOTE — Hospital Course (Addendum)
Alexander Lane is an 87 yo male with PMH  large brain meningioma, HTN, HLD, GERD, DVT and A. fib on Coumadin, CKD-3a, BPH, who presented with AMS, cough, SOB, right leg weakness and fall.   He was noted to be febrile on arrival to the ER, 101.4; he was also reported to have progressive worsening weakness at home per his wife and worsened mentation. He underwent work-up on admission.  CT head showed his known large left frontal extra-axial mass and appeared essentially similar in size to prior imaging with underlying mass-effect and edema involving left frontal lobe. CXR showed a mild airspace opacity involving the left hemidiaphragm new from prior imaging concerning for possible pneumonia. Blood cultures were obtained on admission and became positive in 3/4 bottles for strep pneumonia.

## 2021-09-09 NOTE — Assessment & Plan Note (Addendum)
-   mostly same size on CTH; no surgical intervention when last seen by neurosurgery (wife decline surgical intervention) - given CTH findings, patient started back on keppra and decadron this admission; d/c decadron on 11/26, continue keppra - worsening RLE weakness on 11/28, worsening dysphagia, aspiration and overall functional decline - repeat MRI brain on 11/29 shows increase in midline shift from 11 mm to 18 mm and increased size of mass. He also clinically worsened after decadron was stopped on 11/26 - given the above, his prognosis is poor; he is not likely to benefit from rehab nor able to partake much in it, as evidenced by the past 48 hr decline. He would best be served with a hospice and comfort care route; discussed with palliative care who will continue more discussions with his wife, especially given his inability to continue eating much safely

## 2021-09-09 NOTE — Assessment & Plan Note (Signed)
-   continue coumadin per pharmacy - lopressor PRN for rate control

## 2021-09-09 NOTE — Assessment & Plan Note (Addendum)
-   Febrile, tachycardia, tachypnea, leukocytosis, altered mentation; source presumed pulmonary along with seeding of blood -Repeat blood cultures on 09/10/2021; negative x 1 day - Continue abx - See pneumonia

## 2021-09-09 NOTE — Assessment & Plan Note (Addendum)
-   Likely multifactorial from underlying meningioma and overall deconditioning from underlying bacteremia - PT/OT eval's: SNF recommended

## 2021-09-09 NOTE — Assessment & Plan Note (Signed)
-   Continue terazosin 

## 2021-09-09 NOTE — Evaluation (Signed)
Occupational Therapy Evaluation Patient Details Name: Alexander Lane MRN: [id number] DOB: [birth year] Today's Date: 09/09/2021   History of Present Illness Pt is a 87 y.o. adult with medical history significant of large brain meningioma, HTN, HLD, GERD, DVT and A. fib on Coumadin, CKD-3a, BPH, who presents with AMS, cough, SOB, right leg weakness and fall. MD assessment includes: community aquired pneumonia, sepsis, fall at home, and acute metabolic encephalopathy.   Clinical Impression   Pt seen for OT evaluation this date in setting of acute hospitalization d/t CAP. Pt is poor historian, but reports that he is usually able to get up and move around. Reports that he wears briefs at baseline. Pt requires MIN A to come to sitting EOB And MIN/MOD A with RW to CTS as well as cues for sequence and safety. He is noted to be soiled and requires MAX A for standing peri care, CGA for actual standing balance while static standing with RW. Pt is able to take ~4-5 small shuffling steps from bed to chair with cues for safety and is left in chair with all needs met and in reach. CNA assists with bed change and and RN is updated on session contents. Will continue to follow acutely. Anticipate pt will require f/u OT services in STR upon d/c to increase strength and safety with self care.      Recommendations for follow up therapy are one component of a multi-disciplinary discharge planning process, led by the attending physician.  Recommendations may be updated based on patient status, additional functional criteria and insurance authorization.   Follow Up Recommendations  Skilled nursing-short term rehab (<3 hours/day)    Assistance Recommended at Discharge    Functional Status Assessment  Patient has had a recent decline in their functional status and demonstrates the ability to make significant improvements in function in a reasonable and predictable amount of time.  Equipment Recommendations   BSC/3in1;Tub/shower seat    Recommendations for Other Services       Precautions / Restrictions Precautions Precautions: Fall Restrictions Weight Bearing Restrictions: No      Mobility Bed Mobility Overal bed mobility: Needs Assistance Bed Mobility: Supine to Sit;Sit to Supine     Supine to sit: Min assist;HOB elevated     General bed mobility comments: increased time, assist to advance R LE towards EOB    Transfers Overall transfer level: Needs assistance Equipment used: Rolling walker (2 wheels) Transfers: Sit to/from Stand Sit to Stand: Min assist;Mod assist           General transfer comment: increased time, cues for safe use of RW.      Balance Overall balance assessment: Needs assistance Sitting-balance support: Bilateral upper extremity supported;Feet supported Sitting balance-Leahy Scale: Good     Standing balance support: Bilateral upper extremity supported;During functional activity Standing balance-Leahy Scale: Fair Standing balance comment: Could not march in place, could not clear floor surface with RLE                           ADL either performed or assessed with clinical judgement   ADL                                         General ADL Comments: requires SETUP to MIN A for seated UB ADLs. MOD to MAX A for seated and standing LB ADLs.  MIN/MOD A for ADL transfers.     Vision   Additional Comments: unsure of baseline as pt is poor historian, but he appears to track appropriately throughout session.     Perception     Praxis      Pertinent Vitals/Pain Pain Assessment: Faces Faces Pain Scale: Hurts little more Pain Location: R hip with mobilization Pain Descriptors / Indicators: Discomfort;Grimacing Pain Intervention(s): Limited activity within patient's tolerance;Monitored during session     Hand Dominance     Extremity/Trunk Assessment Upper Extremity Assessment Upper Extremity Assessment:  Generalized weakness   Lower Extremity Assessment Lower Extremity Assessment: Generalized weakness (appears slightly weaker on R LE, possibly r/t some pain in R hip noted on mobilization to EOB)       Communication Communication Communication: HOH   Cognition Arousal/Alertness: Awake/alert Behavior During Therapy: WFL for tasks assessed/performed Overall Cognitive Status: No family/caregiver present to determine baseline cognitive functioning                                 General Comments: Pt oriented to self only, able to follow all simple one step commands, appropriate conversationally. Somewhat drowsy-falls asleep easily. No family to confirm PLOF.     General Comments       Exercises Total Joint Exercises Ankle Circles/Pumps: AROM;Strengthening;Both;10 reps;Supine Quad Sets: AROM;Both;5 reps;Supine Long Arc Quad: AROM;Strengthening;Both;10 reps;Seated Marching in Standing: AROM;5 reps;Both;Standing Other Exercises Other Exercises: OT engages pt in ed re: role, pt with poor understanding/carryover.   Shoulder Instructions      Home Living Family/patient expects to be discharged to:: Private residence Living Arrangements: Spouse/significant other (wife) Available Help at Discharge: Family;Available PRN/intermittently Type of Home: House Home Access: Stairs to enter [employer] of Steps: 7 Entrance Stairs-Rails: None Home Layout: Two level;Bed/bath upstairs Alternate Level Stairs-Number of Steps: flight Alternate Level Stairs-Rails: Right Bathroom Shower/Tub: Teacher, early years/pre: Standard     Home Equipment: Cane - quad;Cane - single point;Grab bars - tub/shower;Rolling Walker (2 wheels)          Prior Functioning/Environment Prior Level of Function : Independent/Modified Independent             Mobility Comments: Pt reported being independent and no use of AD for mobility, fallen once in the past 6 months ADLs  Comments: Pt reported being independent with ADLs        OT Problem List: Decreased strength;Decreased activity tolerance;Decreased safety awareness      OT Treatment/Interventions: Self-care/ADL training;Therapeutic exercise;DME and/or AE instruction;Therapeutic activities    OT Goals(Current goals can be found in the care plan section) Acute Rehab OT Goals Patient Stated Goal: to get stronger OT Goal Formulation: With patient Time For Goal Achievement: 09/23/21 Potential to Achieve Goals: Good  OT Frequency: Min 1X/week   Barriers to D/C:            Co-evaluation              AM-PAC OT "6 Clicks" Daily Activity     Outcome Measure Help from another person eating meals?: None Help from another person taking care of personal grooming?: A Little Help from another person toileting, which includes using toliet, bedpan, or urinal?: A Lot Help from another person bathing (including washing, rinsing, drying)?: A Lot Help from another person to put on and taking off regular upper body clothing?: A Little Help from another person to put on and taking off regular lower  body clothing?: A Lot 6 Click Score: 16   End of Session Equipment Utilized During Treatment: Gait belt;Rolling walker (2 wheels)  Activity Tolerance: Patient tolerated treatment well Patient left: in chair;with chair alarm set;with call bell/phone within reach  OT Visit Diagnosis: Unsteadiness on feet (R26.81);Muscle weakness (generalized) (M62.81);History of falling (Z91.81)                Time: 8719-9412 OT Time Calculation (min): 36 min Charges:  OT General Charges $OT Visit: 1 Visit OT Evaluation $OT Eval Moderate Complexity: 1 Mod OT Treatments $Self Care/Home Management : 8-22 mins $Therapeutic Activity: 8-22 mins  Gerrianne Scale, MS, OTR/L ascom [phone] 09/09/21, 4:26 PM

## 2021-09-09 NOTE — Assessment & Plan Note (Signed)
-   see weakness

## 2021-09-10 DIAGNOSIS — R29898 Other symptoms and signs involving the musculoskeletal system: Secondary | ICD-10-CM

## 2021-09-10 DIAGNOSIS — R7881 Bacteremia: Secondary | ICD-10-CM | POA: Diagnosis not present

## 2021-09-10 DIAGNOSIS — J189 Pneumonia, unspecified organism: Secondary | ICD-10-CM | POA: Diagnosis not present

## 2021-09-10 DIAGNOSIS — N179 Acute kidney failure, unspecified: Secondary | ICD-10-CM | POA: Diagnosis not present

## 2021-09-10 LAB — CBC WITH DIFFERENTIAL/PLATELET
Abs Immature Granulocytes: 0.08 10*3/uL — ABNORMAL HIGH (ref 0.00–0.07)
Basophils Absolute: 0 10*3/uL (ref 0.0–0.1)
Basophils Relative: 0 %
Eosinophils Absolute: 0 10*3/uL (ref 0.0–0.5)
Eosinophils Relative: 0 %
HCT: 31.6 % — ABNORMAL LOW (ref 39.0–52.0)
Hemoglobin: 10.5 g/dL — ABNORMAL LOW (ref 13.0–17.0)
Immature Granulocytes: 1 %
Lymphocytes Relative: 7 %
Lymphs Abs: 1.1 10*3/uL (ref 0.7–4.0)
MCH: 29.7 pg (ref 26.0–34.0)
MCHC: 33.2 g/dL (ref 30.0–36.0)
MCV: 89.5 fL (ref 80.0–100.0)
Monocytes Absolute: 0.6 10*3/uL (ref 0.1–1.0)
Monocytes Relative: 4 %
Neutro Abs: 13.5 10*3/uL — ABNORMAL HIGH (ref 1.7–7.7)
Neutrophils Relative %: 88 %
Platelets: 222 10*3/uL (ref 150–400)
RBC: 3.53 MIL/uL — ABNORMAL LOW (ref 4.22–5.81)
RDW: 13.1 % (ref 11.5–15.5)
WBC: 15.2 10*3/uL — ABNORMAL HIGH (ref 4.0–10.5)
nRBC: 0 % (ref 0.0–0.2)

## 2021-09-10 LAB — BASIC METABOLIC PANEL
Anion gap: 4 — ABNORMAL LOW (ref 5–15)
BUN: 32 mg/dL — ABNORMAL HIGH (ref 8–23)
CO2: 29 mmol/L (ref 22–32)
Calcium: 8.8 mg/dL — ABNORMAL LOW (ref 8.9–10.3)
Chloride: 106 mmol/L (ref 98–111)
Creatinine, Ser: 1.37 mg/dL — ABNORMAL HIGH (ref 0.61–1.24)
GFR, Estimated: 49 mL/min — ABNORMAL LOW (ref >60)
Glucose, Bld: 124 mg/dL — ABNORMAL HIGH (ref 70–99)
Potassium: 4.1 mmol/L (ref 3.5–5.1)
Sodium: 139 mmol/L (ref 135–145)

## 2021-09-10 LAB — URINE CULTURE

## 2021-09-10 LAB — PROCALCITONIN: Procalcitonin: 1.76 ng/mL

## 2021-09-10 LAB — MAGNESIUM: Magnesium: 2 mg/dL (ref 1.7–2.4)

## 2021-09-10 LAB — PROTIME-INR
INR: 1.7 — ABNORMAL HIGH (ref 0.8–1.2)
Prothrombin Time: 19.8 seconds — ABNORMAL HIGH (ref 11.4–15.2)

## 2021-09-10 MED ORDER — SODIUM CHLORIDE 0.9 % IV SOLN
2.0000 g | INTRAVENOUS | Status: DC
  Administered 2021-09-10 – 2021-09-11 (×2): 2 g via INTRAVENOUS
  Filled 2021-09-10 (×2): qty 20
  Filled 2021-09-10: qty 2

## 2021-09-10 MED ORDER — WARFARIN SODIUM 6 MG PO TABS
6.0000 mg | ORAL_TABLET | Freq: Once | ORAL | Status: AC
  Administered 2021-09-10: 15:00:00 6 mg via ORAL
  Filled 2021-09-10: qty 1

## 2021-09-10 NOTE — Progress Notes (Signed)
Progress Note    Alexander Lane   YQM:[id number]  DOB: [birth year]  DOA: 09/08/2021     2 PCP: Rusty Aus, MD  Initial CC: weakness, falling, cough  Hospital Course: [name] is an 87 yo male with PMH  large brain meningioma, HTN, HLD, GERD, DVT and A. fib on Coumadin, CKD-3a, BPH, who presented with AMS, cough, SOB, right leg weakness and fall.   He was noted to be febrile on arrival to the ER, 101.4; he was also reported to have progressive worsening weakness at home per his wife and worsened mentation. He underwent work-up on admission.  CT head showed his known large left frontal extra-axial mass and appeared essentially similar in size to prior imaging with underlying mass-effect and edema involving left frontal lobe. CXR showed a mild airspace opacity involving the left hemidiaphragm new from prior imaging concerning for possible pneumonia. Blood cultures were obtained on admission and became positive in 3/4 bottles for strep pneumonia.  Interval History:  No events overnight.  Very lethargic/weak appearing still.  No obvious distress.  Discussed his need for rehab.  Assessment & Plan: * CAP (community acquired pneumonia) - Small airspace opacity noted along left hemidiaphragm on CXR.  Given strep pneumonia bacteremia, pulmonary source may be culprit -Continue Rocephin - Follow-up urinary strep pneumo antigen and Legionella -Trend procalcitonin (0.23>>2.22>>1.76)  Severe sepsis (HCC) - Febrile, tachycardia, tachypnea, leukocytosis, altered mentation; source presumed pulmonary along with seeding of blood -Repeat blood cultures on 09/10/2021 - Continue Rocephin - See pneumonia  Bacteremia due to Gram-positive bacteria - 3/4 bottles from 09/08/21 growing Strep pna; for now suspecting translocation from presumed PNA - repeat blood cultures on 11/24; if still positive would need further workup with echo  - continue rocephin   Right leg weakness - Likely  multifactorial from underlying meningioma and overall deconditioning from underlying bacteremia - PT/OT eval's: SNF recommended   Acute metabolic encephalopathy - patient symptoms include AMS, weakness - etiology considered due to metabolic derangements likely in setting of underlying infections  H/O meningioma of the brain - mostly same size on CTH; no surgical intervention when last seen by neurosurgery (wife decline surgical intervention) - given CTH findings, patient started back on keppra and decadron this admission   Acute renal failure superimposed on stage 3a chronic kidney disease ([place]) - patient has history of CKD3a. Baseline creat ~ 1 - 1.3.  - patient presents with increase in creat >0.3 mg/dL above baseline, creat increase >1.5x baseline presumed to have occurred within past 7 days PTA - creatinine 1.7 on admission - s/p IVF - holding ARB and lasix  - creat improved   PAF (paroxysmal atrial fibrillation) (HCC) - continue coumadin per pharmacy - lopressor PRN for rate control   Fall at home, initial encounter - see weakness  HLD (hyperlipidemia) - continue zocor  Hypertension - Home meds on hold - Continue PRN hydralazine   BPH (benign prostatic hyperplasia) - Continue terazosin    Old records reviewed in assessment of this patient  Antimicrobials: Azithromycin 09/08/21  x 1 Rocephin 11/22 >> current   DVT prophylaxis: Coumadin  Code Status:   Code Status: Full Code  Disposition Plan: Hopeful for medically stable for discharge by Monday Status is: Inpatient  Objective: Blood pressure 119/62, pulse 84, temperature 97.6 F (36.4 C), temperature source Oral, resp. rate 16, height 5\' 8"  (1.727 m), weight 86.1 kg, SpO2 96 %.  Examination:  Physical Exam Constitutional:      Comments: Weak,  deconditioned appearing, but NAD  HENT:     Head: Normocephalic and atraumatic.     Mouth/Throat:     Mouth: Mucous membranes are moist.  Eyes:     Extraocular  Movements: Extraocular movements intact.  Cardiovascular:     Rate and Rhythm: Normal rate and regular rhythm.  Pulmonary:     Effort: Pulmonary effort is normal. No respiratory distress.     Breath sounds: Rhonchi present.  Abdominal:     General: Bowel sounds are normal. There is no distension.     Palpations: Abdomen is soft.     Tenderness: There is no abdominal tenderness.  Musculoskeletal:        General: Normal range of motion.     Cervical back: Normal range of motion and neck supple. No rigidity or tenderness.  Skin:    General: Skin is warm and dry.  Neurological:     General: No focal deficit present.  Psychiatric:        Mood and Affect: Mood normal.        Behavior: Behavior normal.     Consultants:    Procedures:    Data Reviewed: I have personally reviewed labs and imaging studies     LOS: 2 days  Time spent: Greater than 50% of the 35 minute visit was spent in counseling/coordination of care for the patient as laid out in the A&P.   Dwyane Dee, MD [facility] 09/10/2021, 11:52 AM

## 2021-09-10 NOTE — Progress Notes (Signed)
   09/10/21 1110  Clinical Encounter Type  Visited With Patient  Visit Type Initial;Spiritual support;Social support  Referral From Other (Comment)  Spiritual Encounters  Spiritual Needs Other (Comment) (social support)  Chaplain Burris visited briefly while on the unit. Pt was watching the parade on television and we spoke of memories this evoked. Chaplain offered compassionate presence and social support.

## 2021-09-10 NOTE — Consult Note (Signed)
ANTICOAGULATION CONSULT NOTE - Initial Consult  Pharmacy Consult for warfarin  Indication: atrial fibrillation and DVT  Allergies  Allergen Reactions   Celecoxib Rash    Patient Measurements: Height: 5\' 8"  (172.7 cm) Weight: 86.1 kg (189 lb 13.1 oz) IBW/kg (Calculated) : 68.4  Vital Signs: Temp: 97.5 F (36.4 C) (11/24 0750) Temp Source: Oral (11/24 0750) BP: 144/90 (11/24 0750) Pulse Rate: 73 (11/24 0750)  Labs: Recent Labs    09/08/21 1335 09/09/21 0301 09/10/21 0534  HGB 11.9* 10.4* 10.5*  HCT 36.7* 31.7* 31.6*  PLT 241 200 222  APTT 32  --   --   LABPROT 19.2* 19.7* 19.8*  INR 1.6* 1.7* 1.7*  CREATININE 1.74* 1.54* 1.37*     Estimated Creatinine Clearance (by C-G formula based on SCr of 1.37 mg/dL (H)) Male: 32 mL/min (A) Male: 39 mL/min (A)   Medical History: Past Medical History:  Diagnosis Date   BPH (benign prostatic hyperplasia)    Chronic kidney disease    DVT (deep venous thrombosis) ([place]) 2014   right leg, IVC filter   Dysrhythmia 2019   Paroxysmal atrial fibrillation   Hypertension     Medications:  Scheduled:   dexamethasone  4 mg Oral Q6H   pantoprazole  40 mg Oral BID   simvastatin  20 mg Oral q1800   terazosin  5 mg Oral QHS   Warfarin - Pharmacist Dosing Inpatient   Does not apply q1600    Assessment: 87yo male with PMH of BPH, CKD, DVT, PAF, and HTN who was admitted to the hospital for cough and SOB. Patient is on warfarin for hx of DVT and PAF. Pharmacy was consulted for warfarin dosing.   PTA warfarin regimen: 5mg  daily (35mg  weekly) Last dose 09/07/2021  Noted to have supratherpaeutic INR (4.3) on 08/26/2021 from being given one 7.5mg  tablet in place of normal 5mg  tablet   Drug interactions: ceftriaxone (increases INR)  Date/time  INR Interpretation/dose 11/22 1335 1.6 Subthera, 5 mg given 11/23  0301 1.7 Subthera, 5 mg given 11/24 0534 1.7 Subthera, 6 mg   Goal of Therapy:  INR 2-3 Monitor platelets by  anticoagulation protocol: Yes   Plan:  INR is subtherapeutic. Will give warfarin 6 mg x 1 (~20% increase from home dose) to help get INR therapeutic. Daily INR and CBC at least every 3 days.   Oswald Hillock, PharmD 09/10/2021 8:39 AM

## 2021-09-11 DIAGNOSIS — N1831 Chronic kidney disease, stage 3a: Secondary | ICD-10-CM | POA: Diagnosis not present

## 2021-09-11 DIAGNOSIS — R7881 Bacteremia: Secondary | ICD-10-CM | POA: Diagnosis not present

## 2021-09-11 DIAGNOSIS — J189 Pneumonia, unspecified organism: Secondary | ICD-10-CM | POA: Diagnosis not present

## 2021-09-11 DIAGNOSIS — N179 Acute kidney failure, unspecified: Secondary | ICD-10-CM | POA: Diagnosis not present

## 2021-09-11 LAB — CULTURE, BLOOD (ROUTINE X 2): Special Requests: ADEQUATE

## 2021-09-11 LAB — CBC WITH DIFFERENTIAL/PLATELET
Abs Immature Granulocytes: 0.09 10*3/uL — ABNORMAL HIGH (ref 0.00–0.07)
Basophils Absolute: 0 10*3/uL (ref 0.0–0.1)
Basophils Relative: 0 %
Eosinophils Absolute: 0 10*3/uL (ref 0.0–0.5)
Eosinophils Relative: 0 %
HCT: 31.3 % — ABNORMAL LOW (ref 39.0–52.0)
Hemoglobin: 10.3 g/dL — ABNORMAL LOW (ref 13.0–17.0)
Immature Granulocytes: 1 %
Lymphocytes Relative: 9 %
Lymphs Abs: 1.2 10*3/uL (ref 0.7–4.0)
MCH: 29.2 pg (ref 26.0–34.0)
MCHC: 32.9 g/dL (ref 30.0–36.0)
MCV: 88.7 fL (ref 80.0–100.0)
Monocytes Absolute: 0.6 10*3/uL (ref 0.1–1.0)
Monocytes Relative: 5 %
Neutro Abs: 10.8 10*3/uL — ABNORMAL HIGH (ref 1.7–7.7)
Neutrophils Relative %: 85 %
Platelets: 237 10*3/uL (ref 150–400)
RBC: 3.53 MIL/uL — ABNORMAL LOW (ref 4.22–5.81)
RDW: 13 % (ref 11.5–15.5)
WBC: 12.6 10*3/uL — ABNORMAL HIGH (ref 4.0–10.5)
nRBC: 0 % (ref 0.0–0.2)

## 2021-09-11 LAB — PROTIME-INR
INR: 1.9 — ABNORMAL HIGH (ref 0.8–1.2)
Prothrombin Time: 21.5 seconds — ABNORMAL HIGH (ref 11.4–15.2)

## 2021-09-11 LAB — BASIC METABOLIC PANEL
Anion gap: 4 — ABNORMAL LOW (ref 5–15)
BUN: 39 mg/dL — ABNORMAL HIGH (ref 8–23)
CO2: 27 mmol/L (ref 22–32)
Calcium: 8.5 mg/dL — ABNORMAL LOW (ref 8.9–10.3)
Chloride: 107 mmol/L (ref 98–111)
Creatinine, Ser: 1.28 mg/dL — ABNORMAL HIGH (ref 0.61–1.24)
GFR, Estimated: 53 mL/min — ABNORMAL LOW (ref >60)
Glucose, Bld: 125 mg/dL — ABNORMAL HIGH (ref 70–99)
Potassium: 4.1 mmol/L (ref 3.5–5.1)
Sodium: 138 mmol/L (ref 135–145)

## 2021-09-11 LAB — MAGNESIUM: Magnesium: 2.1 mg/dL (ref 1.7–2.4)

## 2021-09-11 MED ORDER — AMOXICILLIN 500 MG PO CAPS
500.0000 mg | ORAL_CAPSULE | Freq: Three times a day (TID) | ORAL | Status: DC
  Administered 2021-09-11 – 2021-09-12 (×3): 500 mg via ORAL
  Filled 2021-09-11 (×4): qty 1

## 2021-09-11 MED ORDER — WARFARIN SODIUM 5 MG PO TABS
5.0000 mg | ORAL_TABLET | Freq: Once | ORAL | Status: AC
  Administered 2021-09-11: 5 mg via ORAL
  Filled 2021-09-11: qty 1

## 2021-09-11 NOTE — Evaluation (Signed)
Clinical/Bedside Swallow Evaluation Patient Details  Name: Alexander Lane MRN: [id number] Date of Birth: November 15, 1929  Today's Date: 09/11/2021 Time: SLP Start Time (ACUTE ONLY): 4098 SLP Stop Time (ACUTE ONLY): 1191 SLP Time Calculation (min) (ACUTE ONLY): 23 min  Past Medical History:  Past Medical History:  Diagnosis Date   BPH (benign prostatic hyperplasia)    Chronic kidney disease    DVT (deep venous thrombosis) ([place]) 2014   right leg, IVC filter   Dysrhythmia 2019   Paroxysmal atrial fibrillation   Hypertension    Past Surgical History:  Past Surgical History:  Procedure Laterality Date   ESOPHAGOGASTRODUODENOSCOPY (EGD) WITH PROPOFOL N/A 03/06/2021   Procedure: ESOPHAGOGASTRODUODENOSCOPY (EGD) WITH PROPOFOL;  Surgeon: [name], MD;  Location: [facility] ENDOSCOPY;  Service: Endoscopy;  Laterality: N/A;   TONSILLECTOMY     HPI:  Per [name] H&P "Alexander Lane is a 87 y.o. adult with medical history significant of large brain hemangioma, HTN, HLD, GERD, DVT and A. fib on Coumadin, CKD-3a, BPH, who presents with AMS, cough, SOB, right leg weakness and fall.     Patient has altered mental status, cannot provide accurate medical history.  Per his wife (I called his wife by phone), pt has cough and shortness of breath, does not seem to have chest pain.  He has fever with temperature of 101.4 in ED.  No active nausea, vomiting, diarrhea or abdominal pain.  No symptoms of UTI.  Initially patient was not confused, but later on pt developed confusion. He looks very weak.  Per his wife, patient normally is orientated x3, can use walker to walk.  This morning patient developed right leg weakness, cannot move right leg normally.  No facial droop or slurred speech. Per his wife, pt slided out the bed, but not have significant fall or injury." CXR 09/08/21 "1. Mild airspace opacity along the left hemidiaphragm is new from  03/13/2021, and nonspecific for atelectasis versus or early   pneumonia.  2. Small hiatal hernia.  3.  Aortic Atherosclerosis (ICD10-I70.0).  4. Thoracic spondylosis." Head CT 09/08/21 "1. The large left frontal extra-axial mass appears similar versus  mildly increased in size; however, evaluation is limited on this  noncontrast head CT. Similar extensive mass effect and edema in the  left frontal lobe with approximately 11 mm of subfalcine herniation.  An MRI with contrast could provide more sensitive evaluation for  progression if clinically indicated.  2. No evidence of superimposed/interval acute abnormality."    Assessment / Plan / Recommendation  Clinical Impression  Pt seen for clinical swallowing evaluation. Pt alert, pleasant, and cooperative. Denies dysphagia. Seen by SLP during hospitalization in May 2022 with recommendation for mech soft diet with thin liquids (no straws; pt does not use straws at baseline) - see note for additional details. Cleared with RN.    Oral motor examination significant for baseline congested, non-productive cough and loose/ill-fitting dentures. No functional deficits appreciated.   Pt given trials of solid, puree, and thin liquids (via cup - per pt preference). Pt presents with s/sx mild oral dyphagia c/b prolonged mastication of select solid which appeared related to dental status. No overt s/sx pharyngeal dysphagia noted across trials. To palpation, seemingly timely swallow initaitoin and seemingly adequate laryngeal elevation. Non-productive, congested cough noted ~5 minutes after last PO during education with pt. Did not appear related to PO.   Per chart review, temp WNL and WBC trending downward. CXR, 09/08/21, "1. Mild airspace opacity along the left hemidiaphragm is new  from  03/13/2021, and nonspecific for atelectasis versus or early  pneumonia.  2. Small hiatal hernia.  3.  Aortic Atherosclerosis (ICD10-I70.0).  4. Thoracic spondylosis."   Recommend mech soft diet with thin liquids and safe swallowing  strategies/aspiration precautions as outlined below. Pt is at mildly increased risk for aspiration/aspiration PNA given dental status (ill-fitting denture), respiratory status, and medical comorbidities. Risk appears to be reduced with diet modification and safe swallowing strategies/aspiration precautions.   Findings from today's assessment, appear consistent with previous SLP evaluation in May 2022.  SLP to f/u per POC for diet tolerance in 2-3 days.   Pt and RN made aware of results of assessment, diet recommendations, safe swallowing strategies/aspiration precautions, and SLP POC. Pt verbalized understanding/agreement. White board updated.  SLP Visit Diagnosis: Dysphagia, oropharyngeal phase (R13.12)    Aspiration Risk  Mild aspiration risk    Diet Recommendation Dysphagia 3 (Mech soft);Thin liquid   Liquid Administration via: Spoon;Cup Medication Administration:  (whole or crushed with puree) Supervision: Patient able to self feed Compensations: Minimize environmental distractions;Slow rate;Small sips/bites Postural Changes: Seated upright at 90 degrees    Other  Recommendations Oral Care Recommendations: Oral care BID;Staff/trained caregiver to provide oral care    Recommendations for follow up therapy are one component of a multi-disciplinary discharge planning process, led by the attending physician.  Recommendations may be updated based on patient status, additional functional criteria and insurance authorization.  Follow up Recommendations  (TBD)      Assistance Recommended at Discharge  (anticipate need for some level of assistance at d/c)  Functional Status Assessment    Frequency and Duration min 2x/week  1 week       Prognosis Prognosis for Safe Diet Advancement: Fair Barriers to Reach Goals:  (CLOF)      Swallow Study   General Date of Onset: 09/08/21 HPI: Per [name] "Alexander Lane is a 87 y.o. adult with medical history significant of large  brain hemangioma, HTN, HLD, GERD, DVT and A. fib on Coumadin, CKD-3a, BPH, who presents with AMS, cough, SOB, right leg weakness and fall.     Patient has altered mental status, cannot provide accurate medical history.  Per his wife (I called his wife by phone), pt has cough and shortness of breath, does not seem to have chest pain.  He has fever with temperature of 101.4 in ED.  No active nausea, vomiting, diarrhea or abdominal pain.  No symptoms of UTI.  Initially patient was not confused, but later on pt developed confusion. He looks very weak.  Per his wife, patient normally is orientated x3, can use walker to walk.  This morning patient developed right leg weakness, cannot move right leg normally.  No facial droop or slurred speech. Per his wife, pt slided out the bed, but not have significant fall or injury." CXR 09/08/21 "1. Mild airspace opacity along the left hemidiaphragm is new from  03/13/2021, and nonspecific for atelectasis versus or early  pneumonia.  2. Small hiatal hernia.  3.  Aortic Atherosclerosis (ICD10-I70.0).  4. Thoracic spondylosis." Head CT 09/08/21 "1. The large left frontal extra-axial mass appears similar versus  mildly increased in size; however, evaluation is limited on this  noncontrast head CT. Similar extensive mass effect and edema in the  left frontal lobe with approximately 11 mm of subfalcine herniation.  An MRI with contrast could provide more sensitive evaluation for  progression if clinically indicated.  2. No evidence of superimposed/interval acute abnormality." Type of Study:  Bedside Swallow Evaluation Previous Swallow Assessment: clinical swallowing evaluation in May 2022 recommended mech soft diet, thin liquids - no straws; see notes for details Diet Prior to this Study: Regular;Thin liquids Temperature Spikes Noted: No Respiratory Status: Room air History of Recent Intubation: No Behavior/Cognition: Alert;Cooperative;Pleasant mood Oral Cavity Assessment: Within  Functional Limits Oral Care Completed by SLP: Yes Oral Cavity - Dentition: Dentures, top;Dentures, bottom (loose, ill-fitting) Vision: Functional for self-feeding Self-Feeding Abilities: Able to feed self Patient Positioning: Upright in bed Baseline Vocal Quality:  (mildly hoarse; improved with POs) Volitional Cough: Strong;Congested Volitional Swallow: Able to elicit    Oral/Motor/Sensory Function Overall Oral Motor/Sensory Function: Within functional limits   Thin Liquid Thin Liquid: Within functional limits Presentation: Cup;Self Fed Other Comments: ~4 oz water    Puree Puree: Within functional limits Presentation: Self Fed Other Comments: ~ 3 ounces vanilla pudding   Solid       Solid: Impaired Presentation: Self Fed Oral Phase Impairments: Impaired mastication Oral Phase Functional Implications: Impaired mastication Other Comments: x1 saltine cracker given in halves       Cherrie Gauze, M.S., [facility] [phone] (ASCOM)  Clearnce Sorrel Brittin [place] 09/11/2021,11:00 AM

## 2021-09-11 NOTE — TOC Progression Note (Signed)
Transition of Care [facility]) - Progression Note    Patient Details  Name: Alexander Lane MRN: [id number] Date of Birth: 12/12/1929  Transition of Care [facility] CM/SW Contact  Shelbie Hutching, RN Phone Number: 09/11/2021, 4:28 PM  Clinical Narrative:    RNCM spoke with patient's wife via phone.  Wife reports that patient really hated rehab when he went earlier this year, she says he was miserable every day.  She did also say that she would not be able to lift him if he goes home so they have to consider that.  PT needs to work with patient again.  Plan would be preferably to go home with home health but if no other choice he will have to go SNF.  TOC to cont to follow.     Expected Discharge Plan: [place] Barriers to Discharge: Continued Medical Work up  Expected Discharge Plan and Services Expected Discharge Plan: [place]   Discharge Planning Services: CM Consult Post Acute Care Choice: [place] Living arrangements for the past 2 months: Single Family Home                 DME Arranged: N/A DME Agency: NA       HH Arranged: NA HH Agency: NA         Social Determinants of Health (SDOH) Interventions    Readmission Risk Interventions Readmission Risk Prevention Plan 09/11/2021  Transportation Screening Complete  PCP or Specialist Appt within 3-5 Days Complete  HRI or [employer] Complete  Social Work Consult for [place] Planning/Counseling Complete  Palliative Care Screening Not Applicable  Medication Review Press photographer) Complete  Some recent data might be hidden

## 2021-09-11 NOTE — Progress Notes (Signed)
Progress Note    Alexander Lane   FTD:[id number]  DOB: 1929/11/28  DOA: 09/08/2021     3 PCP: Rusty Aus, MD  Initial CC: weakness, falling, cough  Hospital Course: Alexander Lane is an 87 yo male with PMH  large brain meningioma, HTN, HLD, GERD, DVT and A. fib on Coumadin, CKD-3a, BPH, who presented with AMS, cough, SOB, right leg weakness and fall.   He was noted to be febrile on arrival to the ER, 101.4; he was also reported to have progressive worsening weakness at home per his wife and worsened mentation. He underwent work-up on admission.  CT head showed his known large left frontal extra-axial mass and appeared essentially similar in size to prior imaging with underlying mass-effect and edema involving left frontal lobe. CXR showed a mild airspace opacity involving the left hemidiaphragm new from prior imaging concerning for possible pneumonia. Blood cultures were obtained on admission and became positive in 3/4 bottles for strep pneumonia.  Interval History:  No events overnight. Still weak/lethargic as usual but in NAD. Discussed possible d/c to SNF on Monday with him. His cough is rather weak unfortunately. Underwent SLP eval today as well.   Assessment & Plan: * CAP (community acquired pneumonia) - Small airspace opacity noted along left hemidiaphragm on CXR.  Given strep pneumonia bacteremia, pulmonary source may be culprit - s/p Rocephin; sens reviewed; will de-escalate to amoxicillin on 11/25 to complete course  - Follow-up urinary strep pneumo antigen and Legionella -Trend procalcitonin (0.23>>2.22>>1.76)  Severe sepsis (HCC) - Febrile, tachycardia, tachypnea, leukocytosis, altered mentation; source presumed pulmonary along with seeding of blood -Repeat blood cultures on 09/10/2021; negative x 1 day - Continue abx - See pneumonia  Bacteremia due to Gram-positive bacteria - 3/4 bottles from 09/08/21 growing Strep pna; for now suspecting translocation from  presumed PNA - repeat blood cultures on 11/24; negative x 1 day - s/p rocephin - d/c rocephin and start amoxicillin; goal 10 days total course  Right leg weakness - Likely multifactorial from underlying meningioma and overall deconditioning from underlying bacteremia - PT/OT eval's: SNF recommended   Acute metabolic encephalopathy-resolved as of 09/11/2021 - patient symptoms include AMS, weakness - etiology considered due to metabolic derangements likely in setting of underlying infections  H/O meningioma of the brain - mostly same size on CTH; no surgical intervention when last seen by neurosurgery (wife decline surgical intervention) - given CTH findings, patient started back on keppra and decadron this admission   Acute renal failure superimposed on stage 3a chronic kidney disease ([place]) - patient has history of CKD3a. Baseline creat ~ 1 - 1.3.  - patient presents with increase in creat >0.3 mg/dL above baseline, creat increase >1.5x baseline presumed to have occurred within past 7 days PTA - creatinine 1.7 on admission - s/p IVF - holding ARB and lasix  - creat improved   PAF (paroxysmal atrial fibrillation) ([place]) - continue coumadin per pharmacy - lopressor PRN for rate control   Fall at home, initial encounter - see weakness  HLD (hyperlipidemia) - continue zocor  Hypertension - Home meds on hold - Continue PRN hydralazine   BPH (benign prostatic hyperplasia) - Continue terazosin   Old records reviewed in assessment of this patient  Antimicrobials: Azithromycin 09/08/21  x 1 Rocephin 11/22 >> 11/25 Amoxicillin 11/25 >> current    DVT prophylaxis: Coumadin  Code Status:   Code Status: Full Code  Disposition Plan: Hopeful for medically stable for discharge by Monday Status is: Inpatient  Objective: Blood pressure (!) 156/71, pulse 75, temperature 98.1 F (36.7 C), temperature source Oral, resp. rate 17, height 5\' 8"  (1.727 m), weight 86.1 kg, SpO2 95 %.   Examination:  Physical Exam Constitutional:      Comments: Weak, deconditioned appearing, but NAD  HENT:     Head: Normocephalic and atraumatic.     Mouth/Throat:     Mouth: Mucous membranes are moist.  Eyes:     Extraocular Movements: Extraocular movements intact.  Cardiovascular:     Rate and Rhythm: Normal rate and regular rhythm.  Pulmonary:     Effort: Pulmonary effort is normal. No respiratory distress.     Breath sounds: Rhonchi present.  Abdominal:     General: Bowel sounds are normal. There is no distension.     Palpations: Abdomen is soft.     Tenderness: There is no abdominal tenderness.  Musculoskeletal:        General: Normal range of motion.     Cervical back: Normal range of motion and neck supple. No rigidity or tenderness.  Skin:    General: Skin is warm and dry.  Neurological:     General: No focal deficit present.  Psychiatric:        Mood and Affect: Mood normal.        Behavior: Behavior normal.     Consultants:    Procedures:    Data Reviewed: I have personally reviewed labs and imaging studies     LOS: 3 days  Time spent: Greater than 50% of the 35 minute visit was spent in counseling/coordination of care for the patient as laid out in the A&P.   Dwyane Dee, MD [facility] 09/11/2021, 3:46 PM

## 2021-09-11 NOTE — TOC Initial Note (Signed)
Transition of Care [facility]) - Initial/Assessment Note    Patient Details  Name: Alexander Lane MRN: [id number] Date of Birth: 13-Apr-1930  Transition of [facility]) CM/SW Contact:    Alexander Hutching, RN Phone Number: 09/11/2021, 3:21 PM  Clinical Narrative:                 Patient admitted to the hospital with pneumonia.  RNCM met with patient at the bedside today, no family present.  Patient reports that he lives with his wife at home, he has a walker and wheelchair.  He voices that he does not want to go for rehab, but he is so weak he can barely lift his arm, patient said he would consider it.  Gives permission for RNCM to speak with his wife and or daughter.  Wife called and message left, daughter's number in the chart is wrong number.    Patient has been to [facility] in the past.  TOC will cont to reach out to family.    Expected Discharge Plan: Skilled Nursing Facility Barriers to Discharge: Continued Medical Work up   Patient Goals and CMS Choice Patient states their goals for this hospitalization and ongoing recovery are:: Patient reports that he wants to go home not SNF CMS Medicare.gov Compare Post Acute Care list provided to:: Patient Choice offered to / list presented to : Patient, Spouse, Adult Children  Expected Discharge Plan and Services Expected Discharge Plan: [place]   Discharge Planning Services: CM Consult Post Acute Care Choice: [place] Living arrangements for the past 2 months: Single Family Home                 DME Arranged: N/A DME Agency: NA       HH Arranged: NA HH Agency: NA        Prior Living Arrangements/Services Living arrangements for the past 2 months: Single Family Home Lives with:: Spouse Patient language and need for interpreter reviewed:: Yes Do you feel safe going back to the place where you live?: Yes      Need for Family Participation in Patient Care: Yes (Comment) Care giver support system in place?:  Yes (comment) (wife and daughter) Current home services: DME (cane, walker, wheelchair) Criminal Activity/Legal Involvement Pertinent to Current Situation/Hospitalization: No - Comment as needed  Activities of Daily Living Home Assistive Devices/Equipment: Environmental consultant (specify type) ADL Screening (condition at time of admission) Patient's cognitive ability adequate to safely complete daily activities?: No Is the patient deaf or have difficulty hearing?: Yes Does the patient have difficulty seeing, even when wearing glasses/contacts?: No Does the patient have difficulty concentrating, remembering, or making decisions?: Yes Patient able to express need for assistance with ADLs?: No Does the patient have difficulty dressing or bathing?: Yes Does the patient have difficulty walking or climbing stairs?: Yes Weakness of Legs: Both Weakness of Arms/Hands: Both  Permission Sought/Granted Permission sought to share information with : Case Manager, Family Supports Permission granted to share information with : Yes, Verbal Permission Granted  Share Information with NAME: Alexander Lane     Permission granted to share info w Relationship: spouse  Permission granted to share info w Contact Information: [phone]  Emotional Assessment Appearance:: Appears stated age Attitude/Demeanor/Rapport: Engaged Affect (typically observed): Accepting Orientation: : Oriented to Self Alcohol / Substance Use: Not Applicable Psych Involvement: No (comment)  Admission diagnosis:  CAP (community acquired pneumonia) [J18.9] Community acquired pneumonia of left lower lobe of lung [J18.9] Sepsis with encephalopathy without septic  shock, due to unspecified organism ([name]) [A41.9, R65.20, G93.40] Patient Active Problem List   Diagnosis Date Noted   Bacteremia due to Gram-positive bacteria 09/09/2021   CAP (community acquired pneumonia) 09/08/2021   Severe sepsis ([place]) 09/08/2021   Right leg weakness 09/08/2021    Acute respiratory failure with hypoxia ([place]) 03/18/2021   COVID-19 virus infection 03/08/2021   Elevated CK 03/08/2021   BPH (benign prostatic hyperplasia)    Acute renal failure superimposed on stage 3a chronic kidney disease (HCC)    Hypertension    PAF (paroxysmal atrial fibrillation) ([place])    H/O meningioma of the brain    Acute metabolic encephalopathy    Hypertensive urgency    Fall at home, initial encounter    GERD (gastroesophageal reflux disease)    HLD (hyperlipidemia)    DVT (deep venous thrombosis) ([place]) 2014   PCP:  Alexander Aus, MD Pharmacy:   [id number] [place] [id number]: [phone]: [phone]    Social Determinants of Health (SDOH) Interventions    Readmission Risk Interventions Readmission Risk Prevention Plan 09/11/2021  Transportation Screening Complete  PCP or Specialist Appt within 3-5 Days Complete  [place]  Social Work Consult for [place]anning/Counseling Complete  Palliative Care Screening Not Applicable  Medication Review (Press photographer Complete  Some recent data might be hidden

## 2021-09-11 NOTE — Consult Note (Signed)
ANTICOAGULATION CONSULT NOTE - Initial Consult  Pharmacy Consult for warfarin  Indication: atrial fibrillation and DVT  Allergies  Allergen Reactions   Celecoxib Rash    Patient Measurements: Height: 5\' 8"  (172.7 cm) Weight: 86.1 kg (189 lb 13.1 oz) IBW/kg (Calculated) : 68.4  Vital Signs: Temp: 98 F (36.7 C) (11/25 0541) Temp Source: Oral (11/25 0541) BP: 146/75 (11/25 0541) Pulse Rate: 75 (11/25 0541)  Labs: Recent Labs    09/08/21 1335 09/09/21 0301 09/10/21 0534 09/11/21 0508  HGB 11.9* 10.4* 10.5* 10.3*  HCT 36.7* 31.7* 31.6* 31.3*  PLT 241 200 222 237  APTT 32  --   --   --   LABPROT 19.2* 19.7* 19.8* 21.5*  INR 1.6* 1.7* 1.7* 1.9*  CREATININE 1.74* 1.54* 1.37* 1.28*     Estimated Creatinine Clearance (by C-G formula based on SCr of 1.28 mg/dL (H)) Male: 34.2 mL/min (A) Male: 41.8 mL/min (A)   Medical History: Past Medical History:  Diagnosis Date   BPH (benign prostatic hyperplasia)    Chronic kidney disease    DVT (deep venous thrombosis) (HCC) 2014   right leg, IVC filter   Dysrhythmia 2019   Paroxysmal atrial fibrillation   Hypertension     Medications:  Scheduled:   dexamethasone  4 mg Oral Q6H   pantoprazole  40 mg Oral BID   simvastatin  20 mg Oral q1800   terazosin  5 mg Oral QHS   Warfarin - Pharmacist Dosing Inpatient   Does not apply q1600    Assessment: 87yo male with PMH of BPH, CKD, DVT, PAF, and HTN who was admitted to the hospital for cough and SOB. Patient is on warfarin for hx of DVT and PAF. Pharmacy was consulted for warfarin dosing.   PTA warfarin regimen: 5mg  daily (35mg  weekly) Last dose 09/07/2021  Noted to have supratherpaeutic INR (4.3) on 08/26/2021 from being given one 7.5mg  tablet in place of normal 5mg  tablet   Drug interactions: ceftriaxone (increases INR)  Date/time  INR Interpretation/dose 11/22 1335 1.6 Subthera, 5 mg given 11/23 0301 1.7 Subthera, 5 mg given 11/24 0534 1.7 Subthera, 6 mg  11/25  0508 1.9 Subthera, 5 mg  Goal of Therapy:  INR 2-3 Monitor platelets by anticoagulation protocol: Yes   Plan:  INR is subtherapeutic, will give warfarin 5 mg x 1 tonight. Would expect INR to trend up tomorrow from the 6 mg booster dose. Daily INR and CBC at least every 3 days.   Darnelle Bos, PharmD 09/11/2021 7:42 AM

## 2021-09-11 NOTE — NC FL2 (Signed)
[place] LEVEL OF CARE SCREENING TOOL     IDENTIFICATION  Patient Name: Alexander Lane Birthdate: 01-21-1930 Sex: adult Admission Date (Current Location): 09/08/2021  [facility] and [place] Number:  Engineering geologist and Address:  [facility], [address] [place], [place] [address]      Provider Number: [id number]  Attending Physician Name and Address:  Dwyane Dee, MD  Relative Name and Phone Number:  Muhanad Torosyan [phone]    Current Level of Care: Hospital Recommended Level of Care: [place] Prior Approval Number:    Date Approved/Denied:   PASRR Number: [id number]  Discharge Plan: SNF    Current Diagnoses: Patient Active Problem List   Diagnosis Date Noted   Bacteremia due to Gram-positive bacteria 09/09/2021   CAP (community acquired pneumonia) 09/08/2021   Severe sepsis ([place]) 09/08/2021   Right leg weakness 09/08/2021   Acute respiratory failure with hypoxia ([place]) 03/18/2021   COVID-19 virus infection 03/08/2021   Elevated CK 03/08/2021   BPH (benign prostatic hyperplasia)    Acute renal failure superimposed on stage 3a chronic kidney disease ([place])    Hypertension    PAF (paroxysmal atrial fibrillation) ([place])    H/O meningioma of the brain    Hypertensive urgency    Fall at home, initial encounter    GERD (gastroesophageal reflux disease)    HLD (hyperlipidemia)    DVT (deep venous thrombosis) ([place]) 2014    Orientation RESPIRATION BLADDER Height & Weight     Self  Normal Continent, External catheter Weight: 86.1 kg Height:  5\' 8"  (172.7 cm)  BEHAVIORAL SYMPTOMS/MOOD NEUROLOGICAL BOWEL NUTRITION STATUS      Continent Diet (see discharge summary)  AMBULATORY STATUS COMMUNICATION OF NEEDS Skin   Extensive Assist Verbally Normal                       Personal Care Assistance Level of Assistance  Bathing, Feeding, Dressing Bathing Assistance: Maximum assistance Feeding  assistance: Maximum assistance Dressing Assistance: Maximum assistance     Functional Limitations Info  Sight, Hearing, Speech Sight Info: Adequate Hearing Info: Impaired Speech Info: Adequate    SPECIAL CARE FACTORS FREQUENCY  PT (By licensed PT), OT (By licensed OT)     PT Frequency: 5 times per week OT Frequency: 5 times per week            Contractures Contractures Info: Not present    Additional Factors Info  Code Status, Allergies Code Status Info: Full Allergies Info: celecoxib           Current Medications (09/11/2021):  This is the current hospital active medication list Current Facility-Administered Medications  Medication Dose Route Frequency Provider Last Rate Last Admin   acetaminophen (TYLENOL) tablet 650 mg  650 mg Oral Q6H PRN Ivor Costa, MD       albuterol (PROVENTIL) (2.5 MG/3ML) 0.083% nebulizer solution 2.5 mg  2.5 mg Nebulization Q4H PRN Ivor Costa, MD       cefTRIAXone (ROCEPHIN) 2 g in sodium chloride 0.9 % 100 mL IVPB  2 g Intravenous Q24H Dwyane Dee, MD 200 mL/hr at 09/11/21 1449 2 g at 09/11/21 1449   dexamethasone (DECADRON) tablet 4 mg  4 mg Oral Q6H Ivor Costa, MD   4 mg at 09/11/21 1210   dextromethorphan-guaiFENesin (MUCINEX DM) 30-600 MG per 12 hr tablet 1 tablet  1 tablet Oral BID PRN Ivor Costa, MD   1 tablet at 09/11/21 0600  hydrALAZINE (APRESOLINE) injection 5 mg  5 mg Intravenous Q2H PRN Ivor Costa, MD       levETIRAcetam (KEPPRA) 250 mg in sodium chloride 0.9 % 100 mL IVPB  250 mg Intravenous Q12H Ivor Costa, MD 410 mL/hr at 09/11/21 1006 250 mg at 09/11/21 1006   metoprolol tartrate (LOPRESSOR) injection 2.5 mg  2.5 mg Intravenous Q3H PRN Ivor Costa, MD       ondansetron [facility]) injection 4 mg  4 mg Intravenous Q8H PRN Ivor Costa, MD       pantoprazole (PROTONIX) EC tablet 40 mg  40 mg Oral BID Ivor Costa, MD   40 mg at 09/11/21 1007   simvastatin (ZOCOR) tablet 20 mg  20 mg Oral q1800 Ivor Costa, MD   20 mg at 09/10/21 1700    terazosin (HYTRIN) capsule 5 mg  5 mg Oral QHS Ivor Costa, MD   5 mg at 09/10/21 2200   warfarin (COUMADIN) tablet 5 mg  5 mg Oral ONCE-1600 Darnelle Bos, [facility]       Warfarin - Pharmacist Dosing Inpatient   Does not apply [id number] FIEP, [facility]   Given at 09/09/21 1602     Discharge Medications: Please see discharge summary for a list of discharge medications.  Relevant Imaging Results:  Relevant Lab Results:   Additional Information SS# [id number]  Shelbie Hutching, RN

## 2021-09-11 NOTE — Plan of Care (Signed)
  Problem: Education: Goal: Knowledge of General Education information will improve Description: Including pain rating scale, medication(s)/side effects and non-pharmacologic comfort measures Outcome: Progressing   Problem: Clinical Measurements: Goal: Will remain free from infection Outcome: Progressing   Problem: Clinical Measurements: Goal: Respiratory complications will improve Outcome: Progressing   Problem: Clinical Measurements: Goal: Cardiovascular complication will be avoided Outcome: Progressing   Problem: Safety: Goal: Ability to remain free from injury will improve Outcome: Progressing   

## 2021-09-12 DIAGNOSIS — J189 Pneumonia, unspecified organism: Secondary | ICD-10-CM | POA: Diagnosis not present

## 2021-09-12 DIAGNOSIS — Z86011 Personal history of benign neoplasm of the brain: Secondary | ICD-10-CM | POA: Diagnosis not present

## 2021-09-12 DIAGNOSIS — R7881 Bacteremia: Secondary | ICD-10-CM | POA: Diagnosis not present

## 2021-09-12 LAB — CBC WITH DIFFERENTIAL/PLATELET
Abs Immature Granulocytes: 0.12 10*3/uL — ABNORMAL HIGH (ref 0.00–0.07)
Basophils Absolute: 0 10*3/uL (ref 0.0–0.1)
Basophils Relative: 0 %
Eosinophils Absolute: 0 10*3/uL (ref 0.0–0.5)
Eosinophils Relative: 0 %
HCT: 32.8 % — ABNORMAL LOW (ref 39.0–52.0)
Hemoglobin: 10.8 g/dL — ABNORMAL LOW (ref 13.0–17.0)
Immature Granulocytes: 1 %
Lymphocytes Relative: 12 %
Lymphs Abs: 1.2 10*3/uL (ref 0.7–4.0)
MCH: 29.1 pg (ref 26.0–34.0)
MCHC: 32.9 g/dL (ref 30.0–36.0)
MCV: 88.4 fL (ref 80.0–100.0)
Monocytes Absolute: 0.5 10*3/uL (ref 0.1–1.0)
Monocytes Relative: 5 %
Neutro Abs: 7.9 10*3/uL — ABNORMAL HIGH (ref 1.7–7.7)
Neutrophils Relative %: 82 %
Platelets: 264 10*3/uL (ref 150–400)
RBC: 3.71 MIL/uL — ABNORMAL LOW (ref 4.22–5.81)
RDW: 12.6 % (ref 11.5–15.5)
WBC: 9.8 10*3/uL (ref 4.0–10.5)
nRBC: 0 % (ref 0.0–0.2)

## 2021-09-12 LAB — BASIC METABOLIC PANEL
Anion gap: 5 (ref 5–15)
BUN: 35 mg/dL — ABNORMAL HIGH (ref 8–23)
CO2: 28 mmol/L (ref 22–32)
Calcium: 8.8 mg/dL — ABNORMAL LOW (ref 8.9–10.3)
Chloride: 105 mmol/L (ref 98–111)
Creatinine, Ser: 1.05 mg/dL (ref 0.61–1.24)
GFR, Estimated: >60 mL/min (ref >60)
Glucose, Bld: 114 mg/dL — ABNORMAL HIGH (ref 70–99)
Potassium: 4.2 mmol/L (ref 3.5–5.1)
Sodium: 138 mmol/L (ref 135–145)

## 2021-09-12 LAB — PROTIME-INR
INR: 1.9 — ABNORMAL HIGH (ref 0.8–1.2)
Prothrombin Time: 21.6 seconds — ABNORMAL HIGH (ref 11.4–15.2)

## 2021-09-12 LAB — MAGNESIUM: Magnesium: 2.4 mg/dL (ref 1.7–2.4)

## 2021-09-12 MED ORDER — AMOXICILLIN 500 MG PO CAPS
1000.0000 mg | ORAL_CAPSULE | Freq: Three times a day (TID) | ORAL | Status: DC
  Administered 2021-09-12 – 2021-09-15 (×10): 1000 mg via ORAL
  Filled 2021-09-12 (×12): qty 2

## 2021-09-12 MED ORDER — WARFARIN SODIUM 6 MG PO TABS
6.0000 mg | ORAL_TABLET | Freq: Once | ORAL | Status: AC
  Administered 2021-09-12: 6 mg via ORAL
  Filled 2021-09-12: qty 1

## 2021-09-12 MED ORDER — LEVETIRACETAM 250 MG PO TABS
250.0000 mg | ORAL_TABLET | Freq: Two times a day (BID) | ORAL | Status: DC
  Administered 2021-09-12 – 2021-09-15 (×7): 250 mg via ORAL
  Filled 2021-09-12 (×8): qty 1

## 2021-09-12 MED ORDER — AMOXICILLIN 500 MG PO CAPS
500.0000 mg | ORAL_CAPSULE | Freq: Once | ORAL | Status: AC
  Administered 2021-09-12: 500 mg via ORAL
  Filled 2021-09-12: qty 1

## 2021-09-12 NOTE — Progress Notes (Signed)
Progress Note    Alexander Lane   ZSW:[id number]  DOB: [birth year]  DOA: 09/08/2021     4 PCP: Rusty Aus, MD  Initial CC: weakness, falling, cough  Hospital Course: Alexander Lane is an 87 yo male with PMH  large brain meningioma, HTN, HLD, GERD, DVT and A. fib on Coumadin, CKD-3a, BPH, who presented with AMS, cough, SOB, right leg weakness and fall.   He was noted to be febrile on arrival to the ER, 101.4; he was also reported to have progressive worsening weakness at home per his wife and worsened mentation. He underwent work-up on admission.  CT head showed his known large left frontal extra-axial mass and appeared essentially similar in size to prior imaging with underlying mass-effect and edema involving left frontal lobe. CXR showed a mild airspace opacity involving the left hemidiaphragm new from prior imaging concerning for possible pneumonia. Blood cultures were obtained on admission and became positive in 3/4 bottles for strep pneumonia.  Interval History:  No events overnight. Ongoing severe weakness, deconditioning. But remains comfortable.   Assessment & Plan: * CAP (community acquired pneumonia) - Small airspace opacity noted along left hemidiaphragm on CXR.  Given strep pneumonia bacteremia, pulmonary source may be culprit - s/p Rocephin; sens reviewed; will de-escalate to amoxicillin on 11/25 to complete course  - Follow-up urinary strep pneumo antigen and Legionella -Trend procalcitonin (0.23>>2.22>>1.76)  Severe sepsis (HCC) - Febrile, tachycardia, tachypnea, leukocytosis, altered mentation; source presumed pulmonary along with seeding of blood -Repeat blood cultures on 09/10/2021; negative x 1 day - Continue abx - See pneumonia  Bacteremia due to Gram-positive bacteria - 3/4 bottles from 09/08/21 growing Strep pna; for now suspecting translocation from presumed PNA - repeat blood cultures on 11/24; negative x 1 day - s/p rocephin - d/c rocephin and start  amoxicillin; goal 10 days total course  Right leg weakness - Likely multifactorial from underlying meningioma and overall deconditioning from underlying bacteremia - PT/OT eval's: SNF recommended   Acute metabolic encephalopathy-resolved as of 09/11/2021 - patient symptoms include AMS, weakness - etiology considered due to metabolic derangements likely in setting of underlying infections  H/O meningioma of the brain - mostly same size on CTH; no surgical intervention when last seen by neurosurgery (wife decline surgical intervention) - given CTH findings, patient started back on keppra and decadron this admission; d/c decadron on 11/26, continue keppra  Acute renal failure superimposed on stage 3a chronic kidney disease ([place]) - patient has history of CKD3a. Baseline creat ~ 1 - 1.3.  - patient presents with increase in creat >0.3 mg/dL above baseline, creat increase >1.5x baseline presumed to have occurred within past 7 days PTA - creatinine 1.7 on admission - s/p IVF - holding ARB and lasix  - creat back to baseline  PAF (paroxysmal atrial fibrillation) ([place]) - continue coumadin per pharmacy - lopressor PRN for rate control   Fall at home, initial encounter - see weakness  HLD (hyperlipidemia) - continue zocor  Hypertension - Home meds on hold - Continue PRN hydralazine   BPH (benign prostatic hyperplasia) - Continue terazosin    Old records reviewed in assessment of this patient  Antimicrobials: Azithromycin 09/08/21  x 1 Rocephin 11/22 >> 11/25 Amoxicillin 11/25 >> current    DVT prophylaxis: Coumadin  Code Status:   Code Status: Full Code  Disposition Plan: Stable for discharge on Monday Status is: Inpatient  Objective: Blood pressure (!) 162/65, pulse 67, temperature 97.8 F (36.6 C), resp. rate 18, height  5\' 8"  (1.727 m), weight 86.1 kg, SpO2 98 %.  Examination:  Physical Exam Constitutional:      Comments: Weak, deconditioned appearing, but NAD   HENT:     Head: Normocephalic and atraumatic.     Mouth/Throat:     Mouth: Mucous membranes are moist.  Eyes:     Extraocular Movements: Extraocular movements intact.  Cardiovascular:     Rate and Rhythm: Normal rate and regular rhythm.  Pulmonary:     Effort: Pulmonary effort is normal. No respiratory distress.     Breath sounds: Rhonchi present.  Abdominal:     General: Bowel sounds are normal. There is no distension.     Palpations: Abdomen is soft.     Tenderness: There is no abdominal tenderness.  Musculoskeletal:        General: Normal range of motion.     Cervical back: Normal range of motion and neck supple. No rigidity or tenderness.  Skin:    General: Skin is warm and dry.  Neurological:     General: No focal deficit present.  Psychiatric:        Mood and Affect: Mood normal.        Behavior: Behavior normal.     Consultants:    Procedures:    Data Reviewed: I have personally reviewed labs and imaging studies     LOS: 4 days  Time spent: Greater than 50% of the 35 minute visit was spent in counseling/coordination of care for the patient as laid out in the A&P.   Dwyane Dee, MD [facility] 09/12/2021, 1:07 PM

## 2021-09-12 NOTE — Progress Notes (Signed)
Occupational Therapy Treatment Patient Details Name: Alexander Lane MRN: [id number] DOB: 1930-04-21 Today's Date: 09/12/2021   History of present illness Pt is a 87 y.o. adult with medical history significant of large brain meningioma, HTN, HLD, GERD, DVT and A. fib on Coumadin, CKD-3a, BPH, who presents with AMS, cough, SOB, right leg weakness and fall. MD assessment includes: community aquired pneumonia, sepsis, fall at home, and acute metabolic encephalopathy.   OT comments  Pt seen for OT Tx this date to f/u re: safety with ADLs/ADL mobility. Pt requires MOD/MAX A with LB bathing and peri care and CGA for standing balance with RW. He requires MIN A to complete ~7-8 small shuffling steps (noted difficulty clearing R foot) from bed to chair. He requires cues for safe use of RW throughout as well as cues for sequencing of ADL tasks performed. Will continue to follow acutely. Continue to anticipate that pt will require STR f/u services to improve strength. Aware pt states he wants to go home, but he demonstrates poor balance and strength and has previously mentioned that his wife will not be able to physically assist him.    Recommendations for follow up therapy are one component of a multi-disciplinary discharge planning process, led by the attending physician.  Recommendations may be updated based on patient status, additional functional criteria and insurance authorization.    Follow Up Recommendations  Skilled nursing-short term rehab (<3 hours/day)    Assistance Recommended at Discharge    Equipment Recommendations  BSC/3in1;Tub/shower seat    Recommendations for Other Services      Precautions / Restrictions Precautions Precautions: Fall Restrictions Weight Bearing Restrictions: No       Mobility Bed Mobility Overal bed mobility: Needs Assistance Bed Mobility: Supine to Sit;Sit to Supine     Supine to sit: HOB elevated;Min guard;Min assist     General bed mobility  comments: increased time, assist to advance R LE towards EOB    Transfers Overall transfer level: Needs assistance Equipment used: Rolling walker (2 wheels) Transfers: Sit to/from Stand Sit to Stand: Min assist           General transfer comment: cues for use of RW, hand placement     Balance Overall balance assessment: Needs assistance Sitting-balance support: Bilateral upper extremity supported;Feet supported Sitting balance-Leahy Scale: Good       Standing balance-Leahy Scale: Poor Standing balance comment: still noted to drag R LE, poor balance with fxl mobility and for dynamic reaching tasks like peri care, F with static standing                           ADL either performed or assessed with clinical judgement   ADL Overall ADL's : Needs assistance/impaired     Grooming: Wash/dry face;Set up;Cueing for sequencing;Sitting   Upper Body Bathing: Minimal assistance;Moderate assistance;Sitting;Cueing for sequencing   Lower Body Bathing: Moderate assistance;Maximal assistance;Sit to/from stand Lower Body Bathing Details (indicate cue type and reason): with CGA with RW for balance and MOD/MAX A for actual posterior LB bathing d/t poor dynamic standing Upper Body Dressing : Minimal assistance;Sitting   Lower Body Dressing: Moderate assistance;Maximal assistance;Sitting/lateral leans Lower Body Dressing Details (indicate cue type and reason): socks     Toileting- Clothing Manipulation and Hygiene: Maximal assistance;Sit to/from stand Toileting - Clothing Manipulation Details (indicate cue type and reason): peri care after loose BM in bed, appears unaware of when he needs to have BM and unaware when is  soiled.            Extremity/Trunk Assessment              Vision       Perception     Praxis      Cognition Arousal/Alertness: Awake/alert Behavior During Therapy: WFL for tasks assessed/performed Overall Cognitive Status: No family/caregiver  present to determine baseline cognitive functioning                                 General Comments: Oriented to self and "hospital" but not which hospital, not time or situation. Follows most simple one step commands. Somewhat HOH. Increased processing time.          Exercises Other Exercises Other Exercises: OT enagages pt in bathing/dressing tasks wiht cues to sequence   Shoulder Instructions       General Comments      Pertinent Vitals/ Pain       Pain Assessment: Faces Faces Pain Scale: Hurts little more Pain Location: R hip with mobilization Pain Descriptors / Indicators: Discomfort;Grimacing Pain Intervention(s): Limited activity within patient's tolerance;Monitored during session  Home Living                                          Prior Functioning/Environment              Frequency  Min 2X/week        Progress Toward Goals  OT Goals(current goals can now be found in the care plan section)  Progress towards OT goals: Progressing toward goals  Acute Rehab OT Goals Patient Stated Goal: to get stronger OT Goal Formulation: With patient Time For Goal Achievement: 09/23/21 Potential to Achieve Goals: Good  Plan Discharge plan remains appropriate;Frequency needs to be updated    Co-evaluation                 AM-PAC OT "6 Clicks" Daily Activity     Outcome Measure   Help from another person eating meals?: None Help from another person taking care of personal grooming?: A Little Help from another person toileting, which includes using toliet, bedpan, or urinal?: A Lot Help from another person bathing (including washing, rinsing, drying)?: A Lot Help from another person to put on and taking off regular upper body clothing?: A Little Help from another person to put on and taking off regular lower body clothing?: A Lot 6 Click Score: 16    End of Session Equipment Utilized During Treatment: Gait belt;Rolling  walker (2 wheels)  OT Visit Diagnosis: Unsteadiness on feet (R26.81);Muscle weakness (generalized) (M62.81);History of falling (Z91.81)   Activity Tolerance Patient tolerated treatment well   Patient Left in chair;with chair alarm set;with call bell/phone within reach   Nurse Communication Mobility status;Other (comment) (loose BMs, need for bed change -notified CNA)        Time: 6767-2094 OT Time Calculation (min): 24 min  Charges: OT General Charges $OT Visit: 1 Visit OT Treatments $Self Care/Home Management : 8-22 mins $Therapeutic Activity: 8-22 mins  Gerrianne Scale, MS, OTR/L ascom [phone] 09/12/21, 11:18 AM

## 2021-09-12 NOTE — Progress Notes (Signed)
Physical Therapy Treatment Patient Details Name: Alexander Lane MRN: [id number] DOB: 09-15-1930 Today's Date: 09/12/2021   History of Present Illness Pt is a 87 y.o. adult with medical history significant of large brain meningioma, HTN, HLD, GERD, DVT and A. fib on Coumadin, CKD-3a, BPH, who presents with AMS, cough, SOB, right leg weakness and fall. MD assessment includes: community aquired pneumonia, sepsis, fall at home, and acute metabolic encephalopathy.    PT Comments    Pt was sitting in recliner upon arriving. He had OT earlier this date and was agreeable to PT session. He is alert and oriented x 2. Definitely does not have full understanding of deficits + poor overall safety awareness. He did stand to RW and ambulate to doorway of room and return. Very slow antalgic step to gait pattern. Constant vcs to stay inside of RW and not let it get too far in front of him. Poor carryover between cues. Pt will greatly benefit from SNF at DC to improve safety with all ADLs. Acute PT will continue to follow and progress as able per current POC.    Recommendations for follow up therapy are one component of a multi-disciplinary discharge planning process, led by the attending physician.  Recommendations may be updated based on patient status, additional functional criteria and insurance authorization.  Follow Up Recommendations  Skilled nursing-short term rehab (<3 hours/day)     Assistance Recommended at Discharge Frequent or constant Supervision/Assistance  Equipment Recommendations  None recommended by PT       Precautions / Restrictions Precautions Precautions: Fall Restrictions Weight Bearing Restrictions: No     Mobility  Bed Mobility Overal bed mobility: Needs Assistance Bed Mobility: Supine to Sit;Sit to Supine     Supine to sit: HOB elevated;Min guard;Min assist     General bed mobility comments: Pt was in recliner pre/post session    Transfers Overall transfer  level: Needs assistance Equipment used: Rolling walker (2 wheels) Transfers: Sit to/from Stand Sit to Stand: Min assist;Mod assist           General transfer comment: min-mod assist of one to stand from recliner. vcs for technique and handplacement required    Ambulation/Gait Ambulation/Gait assistance: Min assist Gait Distance (Feet): 40 Feet Assistive device: Rolling walker (2 wheels) Gait Pattern/deviations: Step-to pattern;Decreased stride length;Trunk flexed Gait velocity: decreased     General Gait Details: pt tends to let RW get too far out in front on him. Constant vcs and min assist for safety     Balance Overall balance assessment: Needs assistance Sitting-balance support: Bilateral upper extremity supported;Feet supported Sitting balance-Leahy Scale: Good     Standing balance support: Bilateral upper extremity supported;During functional activity Standing balance-Leahy Scale: Fair Standing balance comment: still noted to drag R LE, poor balance with fxl mobility and for dynamic reaching tasks like peri care, F with static standing     Cognition Arousal/Alertness: Awake/alert Behavior During Therapy: WFL for tasks assessed/performed Overall Cognitive Status: No family/caregiver present to determine baseline cognitive functioning      General Comments: pt is alert and cooperative however does present with some cognition deficits. Poor insight of deficits.        Exercises Other Exercises Other Exercises: OT enagages pt in bathing/dressing tasks wiht cues to sequence        Pertinent Vitals/Pain Pain Assessment: 0-10 Pain Score: 4  Faces Pain Scale: Hurts a little bit Pain Location: R hip with mobilization Pain Descriptors / Indicators: Discomfort;Grimacing Pain Intervention(s): Limited activity within  patient's tolerance;Monitored during session;Premedicated before session;Repositioned     PT Goals (current goals can now be found in the care plan  section) Acute Rehab PT Goals Patient Stated Goal: go home Progress towards PT goals: Progressing toward goals    Frequency    Min 2X/week      PT Plan Current plan remains appropriate   AM-PAC PT "6 Clicks" Mobility   Outcome Measure  Help needed turning from your back to your side while in a flat bed without using bedrails?: A Little Help needed moving from lying on your back to sitting on the side of a flat bed without using bedrails?: A Little Help needed moving to and from a bed to a chair (including a wheelchair)?: A Lot Help needed standing up from a chair using your arms (e.g., wheelchair or bedside chair)?: A Lot Help needed to walk in hospital room?: A Lot Help needed climbing 3-5 steps with a railing? : A Lot 6 Click Score: 14    End of Session Equipment Utilized During Treatment: Gait belt Activity Tolerance: Patient tolerated treatment well Patient left: in chair;with call bell/phone within reach;with chair alarm set;with nursing/sitter in room Nurse Communication: Mobility status PT Visit Diagnosis: Unsteadiness on feet (R26.81);Muscle weakness (generalized) (M62.81);History of falling (Z91.81);Difficulty in walking, not elsewhere classified (R26.2)     Time: 6599-3570 PT Time Calculation (min) (ACUTE ONLY): 22 min  Charges:  $Gait Training: 8-22 mins                     Julaine Fusi PTA 09/12/21, 11:51 AM

## 2021-09-12 NOTE — Consult Note (Signed)
[place] for warfarin  Indication: atrial fibrillation and DVT  Allergies  Allergen Reactions   Celecoxib Rash    Patient Measurements: Height: 5\' 8"  (172.7 cm) Weight: 86.1 kg (189 lb 13.1 oz) IBW/kg (Calculated) : 68.4  Vital Signs: Temp: 98.9 F (37.2 C) (11/26 0731) Temp Source: Oral (11/26 0731) BP: 148/78 (11/26 0731) Pulse Rate: 66 (11/26 0731)  Labs: Recent Labs    09/10/21 0534 09/11/21 0508 09/12/21 0404  HGB 10.5* 10.3* 10.8*  HCT 31.6* 31.3* 32.8*  PLT 222 237 264  LABPROT 19.8* 21.5* 21.6*  INR 1.7* 1.9* 1.9*  CREATININE 1.37* 1.28* 1.05     Estimated Creatinine Clearance (by C-G formula based on SCr of 1.05 mg/dL) Male: 41.7 mL/min Male: 50.9 mL/min   Medical History: Past Medical History:  Diagnosis Date   BPH (benign prostatic hyperplasia)    Chronic kidney disease    DVT (deep venous thrombosis) ([place]) 2014   right leg, IVC filter   Dysrhythmia 2019   Paroxysmal atrial fibrillation   Hypertension     Medications:  Scheduled:   amoxicillin  500 mg Oral Q8H   dexamethasone  4 mg Oral Q6H   pantoprazole  40 mg Oral BID   simvastatin  20 mg Oral q1800   terazosin  5 mg Oral QHS   Warfarin - Pharmacist Dosing Inpatient   Does not apply q1600    Assessment: 87yo male with PMH of BPH, CKD, DVT, PAF, and HTN who was admitted to the hospital for cough and SOB. Patient is on warfarin for hx of DVT and PAF. Pharmacy was consulted for warfarin dosing.   PTA warfarin regimen: 5mg  daily (35mg  weekly) Last dose 09/07/2021  Noted to have supratherpaeutic INR (4.3) on 08/26/2021 from being given one 7.5mg  tablet in place of normal 5mg  tablet   Drug interactions: ceftriaxone (increases INR)  Date  INR Interpretation/dose 11/22  1.6 Subthera, 5 mg given 11/23  1.7 Subthera, 5 mg given 11/24  1.7 Subthera, 6 mg  11/25  1.9 Subthera, 5 mg 11/26 1.9  Subthera, 6 mg   Goal of Therapy:  INR 2-3 Monitor  platelets by anticoagulation protocol: Yes   Plan:  INR is subtherapeutic. Will give warfarin 6 mg x 1 (~20% increase from home dose) to help get INR therapeutic. Daily INR and CBC at least every 3 days.  Oswald Hillock, PharmD 09/12/2021 8:17 AM

## 2021-09-12 NOTE — TOC Progression Note (Signed)
Transition of Care [facility]) - Progression Note    Patient Details  Name: REAGAN KLEMZ MRN: [id number] Date of Birth: 1929/10/23  Transition of Care [facility] CM/SW Contact  Boris Sharper, LCSW Phone Number: 09/12/2021, 2:54 PM  Clinical Narrative:    Pt's wife agreed that she is unable to take care of him and he will need to go to SNF, CSW sent referral out to surrounding facilities.    Expected Discharge Plan: [place] Barriers to Discharge: Continued Medical Work up  Expected Discharge Plan and Services Expected Discharge Plan: [place]   Discharge Planning Services: CM Consult Post Acute Care Choice: [place] Living arrangements for the past 2 months: Single Family Home                 DME Arranged: N/A DME Agency: NA       HH Arranged: NA HH Agency: NA         Social Determinants of Health (SDOH) Interventions    Readmission Risk Interventions Readmission Risk Prevention Plan 09/11/2021  Transportation Screening Complete  PCP or Specialist Appt within 3-5 Days Complete  HRI or [place] Complete  Social Work Consult for [place] Planning/Counseling Complete  Palliative Care Screening Not Applicable  Medication Review [employer]) Complete  Some recent data might be hidden

## 2021-09-13 DIAGNOSIS — J189 Pneumonia, unspecified organism: Secondary | ICD-10-CM | POA: Diagnosis not present

## 2021-09-13 DIAGNOSIS — N179 Acute kidney failure, unspecified: Secondary | ICD-10-CM | POA: Diagnosis not present

## 2021-09-13 DIAGNOSIS — N1831 Chronic kidney disease, stage 3a: Secondary | ICD-10-CM | POA: Diagnosis not present

## 2021-09-13 DIAGNOSIS — R7881 Bacteremia: Secondary | ICD-10-CM | POA: Diagnosis not present

## 2021-09-13 LAB — CBC WITH DIFFERENTIAL/PLATELET
Abs Immature Granulocytes: 0.17 10*3/uL — ABNORMAL HIGH (ref 0.00–0.07)
Basophils Absolute: 0 10*3/uL (ref 0.0–0.1)
Basophils Relative: 0 %
Eosinophils Absolute: 0 10*3/uL (ref 0.0–0.5)
Eosinophils Relative: 0 %
HCT: 32.9 % — ABNORMAL LOW (ref 39.0–52.0)
Hemoglobin: 11 g/dL — ABNORMAL LOW (ref 13.0–17.0)
Immature Granulocytes: 2 %
Lymphocytes Relative: 21 %
Lymphs Abs: 2.1 10*3/uL (ref 0.7–4.0)
MCH: 29.2 pg (ref 26.0–34.0)
MCHC: 33.4 g/dL (ref 30.0–36.0)
MCV: 87.3 fL (ref 80.0–100.0)
Monocytes Absolute: 1 10*3/uL (ref 0.1–1.0)
Monocytes Relative: 10 %
Neutro Abs: 6.5 10*3/uL (ref 1.7–7.7)
Neutrophils Relative %: 67 %
Platelets: 251 10*3/uL (ref 150–400)
RBC: 3.77 MIL/uL — ABNORMAL LOW (ref 4.22–5.81)
RDW: 12.7 % (ref 11.5–15.5)
WBC: 9.8 10*3/uL (ref 4.0–10.5)
nRBC: 0 % (ref 0.0–0.2)

## 2021-09-13 LAB — BASIC METABOLIC PANEL
Anion gap: 3 — ABNORMAL LOW (ref 5–15)
BUN: 34 mg/dL — ABNORMAL HIGH (ref 8–23)
CO2: 29 mmol/L (ref 22–32)
Calcium: 8.6 mg/dL — ABNORMAL LOW (ref 8.9–10.3)
Chloride: 105 mmol/L (ref 98–111)
Creatinine, Ser: 1.05 mg/dL (ref 0.61–1.24)
GFR, Estimated: >60 mL/min (ref >60)
Glucose, Bld: 96 mg/dL (ref 70–99)
Potassium: 4.3 mmol/L (ref 3.5–5.1)
Sodium: 137 mmol/L (ref 135–145)

## 2021-09-13 LAB — PROTIME-INR
INR: 2.2 — ABNORMAL HIGH (ref 0.8–1.2)
Prothrombin Time: 24.3 seconds — ABNORMAL HIGH (ref 11.4–15.2)

## 2021-09-13 LAB — MAGNESIUM: Magnesium: 2.3 mg/dL (ref 1.7–2.4)

## 2021-09-13 MED ORDER — FUROSEMIDE 20 MG PO TABS
20.0000 mg | ORAL_TABLET | Freq: Every day | ORAL | Status: DC
  Administered 2021-09-13 – 2021-09-17 (×5): 20 mg via ORAL
  Filled 2021-09-13 (×5): qty 1

## 2021-09-13 MED ORDER — WARFARIN SODIUM 5 MG PO TABS
5.0000 mg | ORAL_TABLET | Freq: Once | ORAL | Status: AC
  Administered 2021-09-13: 16:00:00 5 mg via ORAL
  Filled 2021-09-13: qty 1

## 2021-09-13 MED ORDER — IRBESARTAN 150 MG PO TABS
150.0000 mg | ORAL_TABLET | Freq: Every day | ORAL | Status: DC
  Administered 2021-09-13 – 2021-09-17 (×5): 150 mg via ORAL
  Filled 2021-09-13 (×5): qty 1

## 2021-09-13 NOTE — Progress Notes (Signed)
Speech Language Pathology Treatment:    Patient Details Name: Alexander Lane MRN: [id number] DOB: 09/10/1930 Today's Date: 09/13/2021 Time: 1140-1200 SLP Time Calculation (min) (ACUTE ONLY): 20 min  Assessment / Plan / Recommendation Clinical Impression  Pt seen for diet tolerance. Per RN, pt continues with s/sx with thin liquids via cup. However, RN report pt tolerating other consistency without difficulty.  Upon SLP entrance to room, pt seated upright in recliner. Pt observed with soft solid, pureed texture, nectar-thick liquids, and thin liquids. Pt with baseline congested cough prior to PO intake. Oral phase was functional across trials. Pt with immediate and prolonged coughing spell with wet vocal quality following cup sips of thin liquids (on ~50% of trials). No overt s/sx pharyngeal dysphagia observed across other textures.   Recommend downgrade to mech soft diet and NECTAR thick liquids with safe swallowing strategies as outlined below.   SLP to f/u next date for diet tolerance. Pt may benefit from MBSS prior to d/c to instrumentally evaluate pt's oropharyngeal swallow function.   Discussed results of diet tolerance/re-assessment, diet recommendations, safe swallowing strategies/aspiration precautions, and SLP POC with pt and pt's RN. Pt may benefit from reinforcement of content given seemingly reduced functional recall.     HPI HPI: Per 60 H&P "LINKOLN ALKIRE is a 87 y.o. adult with medical history significant of large brain hemangioma, HTN, HLD, GERD, DVT and A. fib on Coumadin, CKD-3a, BPH, who presents with AMS, cough, SOB, right leg weakness and fall.     Patient has altered mental status, cannot provide accurate medical history.  Per his wife (I called his wife by phone), pt has cough and shortness of breath, does not seem to have chest pain.  He has fever with temperature of 101.4 in ED.  No active nausea, vomiting, diarrhea or abdominal pain.  No symptoms of UTI.   Initially patient was not confused, but later on pt developed confusion. He looks very weak.  Per his wife, patient normally is orientated x3, can use walker to walk.  This morning patient developed right leg weakness, cannot move right leg normally.  No facial droop or slurred speech. Per his wife, pt slided out the bed, but not have significant fall or injury." CXR 09/08/21 "1. Mild airspace opacity along the left hemidiaphragm is new from  03/13/2021, and nonspecific for atelectasis versus or early  pneumonia.  2. Small hiatal hernia.  3.  Aortic Atherosclerosis (ICD10-I70.0).  4. Thoracic spondylosis." Head CT 09/08/21 "1. The large left frontal extra-axial mass appears similar versus  mildly increased in size; however, evaluation is limited on this  noncontrast head CT. Similar extensive mass effect and edema in the  left frontal lobe with approximately 11 mm of subfalcine herniation.  An MRI with contrast could provide more sensitive evaluation for  progression if clinically indicated.  2. No evidence of superimposed/interval acute abnormality."      SLP Plan  Continue with current plan of care (may benefit from [facility])      Recommendations for follow up therapy are one component of a multi-disciplinary discharge planning process, led by the attending physician.  Recommendations may be updated based on patient status, additional functional criteria and insurance authorization.    Recommendations  Diet recommendations: Dysphagia 3 (mechanical soft);Nectar-thick liquid Liquids provided via: No straw Medication Administration: Crushed with puree Supervision: Patient able to self feed;Intermittent supervision to cue for compensatory strategies Compensations: Minimize environmental distractions;Slow rate;Small sips/bites Postural Changes and/or Swallow Maneuvers: Out of bed for  meals;Seated upright 90 degrees;Upright 30-60 min after meal                Oral Care Recommendations: Oral care  BID;Staff/trained caregiver to provide oral care Follow Up Recommendations: Skilled nursing-short term rehab (<3 hours/day) Assistance recommended at discharge: Intermittent Supervision/Assistance SLP Visit Diagnosis: Dysphagia, oropharyngeal phase (R13.12) Plan: Continue with current plan of care (may benefit from MBSS)       Cherrie Gauze, M.S., [facility] [phone] ([place])                Quintella Baton  09/13/2021, 2:30 PM

## 2021-09-13 NOTE — Progress Notes (Signed)
Patient OOB to chair with family at bedside. Family updated on new changes to diet order recommendations, including need for Nectar thick liquids. Family voiced understanding and stated they would ensure he had correct textures available on return home. Family expressed interest in patient having MBS completed prior to d/c if indicated by SLP. SLP notified.  Patient noted to have much less coughing with thickened liquid intake and was able to verbalize understanding of need for changes by stating, "This makes me choke less."  Patient also reminded of importance of being OOB and upright for meals.

## 2021-09-13 NOTE — Consult Note (Signed)
[place] for warfarin  Indication: atrial fibrillation and DVT  Allergies  Allergen Reactions   Celecoxib Rash    Patient Measurements: Height: 5\' 8"  (172.7 cm) Weight: 86.1 kg (189 lb 13.1 oz) IBW/kg (Calculated) : 68.4  Vital Signs: Temp: 98 F (36.7 C) (11/27 0740) Temp Source: Oral (11/27 0740) BP: 148/64 (11/27 0740) Pulse Rate: 69 (11/27 0740)  Labs: Recent Labs    09/11/21 0508 09/12/21 0404 09/13/21 0427  HGB 10.3* 10.8* 11.0*  HCT 31.3* 32.8* 32.9*  PLT 237 264 251  LABPROT 21.5* 21.6* 24.3*  INR 1.9* 1.9* 2.2*  CREATININE 1.28* 1.05 1.05     Estimated Creatinine Clearance (by C-G formula based on SCr of 1.05 mg/dL) Male: 41.7 mL/min Male: 50.9 mL/min   Medical History: Past Medical History:  Diagnosis Date   BPH (benign prostatic hyperplasia)    Chronic kidney disease    DVT (deep venous thrombosis) ([place]) 2014   right leg, IVC filter   Dysrhythmia 2019   Paroxysmal atrial fibrillation   Hypertension     Medications:  Scheduled:   amoxicillin  1,000 mg Oral Q8H   levETIRAcetam  250 mg Oral BID   pantoprazole  40 mg Oral BID   simvastatin  20 mg Oral q1800   terazosin  5 mg Oral QHS   Warfarin - Pharmacist Dosing Inpatient   Does not apply q1600    Assessment: 87yo male with PMH of BPH, CKD, DVT, PAF, and HTN who was admitted to the hospital for cough and SOB. Patient is on warfarin for hx of DVT and PAF. Pharmacy was consulted for warfarin dosing.   PTA warfarin regimen: 5mg  daily (35mg  weekly) Last dose 09/07/2021  Noted to have supratherpaeutic INR (4.3) on 08/26/2021 from being given one 7.5mg  tablet in place of normal 5mg  tablet   Drug interactions: ceftriaxone switched to amoxicillin (increases INR)  Date  INR Interpretation/dose 11/22  1.6 Subthera, 5 mg given 11/23  1.7 Subthera, 5 mg given 11/24  1.7 Subthera, 6 mg  11/25  1.9 Subthera, 5 mg 11/26 1.9  Subthera, 6 mg   11/27 2.2 Therapeutic, 5 mg   Goal of Therapy:  INR 2-3 Monitor platelets by anticoagulation protocol: Yes   Plan:  INR is subtherapeutic. Will give warfarin 5 mg x 1 (home dose). Daily INR and CBC at least every 3 days.  Oswald Hillock, PharmD 09/13/2021 10:19 AM

## 2021-09-13 NOTE — Progress Notes (Signed)
Progress Note    Alexander Lane   WVP:[id number]  DOB: 1930/10/04  DOA: 09/08/2021     5 PCP: Rusty Aus, MD  Initial CC: weakness, falling, cough  Hospital Course: Alexander Lane is an 87 yo male with PMH  large brain meningioma, HTN, HLD, GERD, DVT and A. fib on Coumadin, CKD-3a, BPH, who presented with AMS, cough, SOB, right leg weakness and fall.   He was noted to be febrile on arrival to the ER, 101.4; he was also reported to have progressive worsening weakness at home per his wife and worsened mentation. He underwent work-up on admission.  CT head showed his known large left frontal extra-axial mass and appeared essentially similar in size to prior imaging with underlying mass-effect and edema involving left frontal lobe. CXR showed a mild airspace opacity involving the left hemidiaphragm new from prior imaging concerning for possible pneumonia. Blood cultures were obtained on admission and became positive in 3/4 bottles for strep pneumonia.  Interval History:  No events overnight. Discussed possible discharge tomorrow if bed at rehab found.   Assessment & Plan: * CAP (community acquired pneumonia) - Small airspace opacity noted along left hemidiaphragm on CXR.  Given strep pneumonia bacteremia, pulmonary source may be culprit - s/p Rocephin; sens reviewed; will de-escalate to amoxicillin on 11/25 to complete course  - Follow-up urinary strep pneumo antigen and Legionella -Trend procalcitonin (0.23>>2.22>>1.76)  Severe sepsis (HCC) - Febrile, tachycardia, tachypnea, leukocytosis, altered mentation; source presumed pulmonary along with seeding of blood -Repeat blood cultures on 09/10/2021; negative x 1 day - Continue abx - See pneumonia  Bacteremia due to Gram-positive bacteria - 3/4 bottles from 09/08/21 growing Strep pna; for now suspecting translocation from presumed PNA - repeat blood cultures on 11/24; negative x 1 day - s/p rocephin - d/c rocephin and start  amoxicillin; goal 10 days total course (end date 09/17/21)  Right leg weakness - Likely multifactorial from underlying meningioma and overall deconditioning from underlying bacteremia - PT/OT eval's: SNF recommended   Acute metabolic encephalopathy-resolved as of 09/11/2021 - patient symptoms include AMS, weakness - etiology considered due to metabolic derangements likely in setting of underlying infections  H/O meningioma of the brain - mostly same size on CTH; no surgical intervention when last seen by neurosurgery (wife decline surgical intervention) - given CTH findings, patient started back on keppra and decadron this admission; d/c decadron on 11/26, continue keppra  Acute renal failure superimposed on stage 3a chronic kidney disease ([place]) - patient has history of CKD3a. Baseline creat ~ 1 - 1.3.  - patient presents with increase in creat >0.3 mg/dL above baseline, creat increase >1.5x baseline presumed to have occurred within past 7 days PTA - creatinine 1.7 on admission - s/p IVF - resume ARB and lasix on 11/27 - creat back to baseline  PAF (paroxysmal atrial fibrillation) ([place]) - continue coumadin per pharmacy - lopressor PRN for rate control   Fall at home, initial encounter - see weakness  HLD (hyperlipidemia) - continue zocor  Hypertension - Resume ARB - Continue PRN hydralazine   BPH (benign prostatic hyperplasia) - Continue terazosin    Old records reviewed in assessment of this patient  Antimicrobials: Azithromycin 09/08/21  x 1 Rocephin 11/22 >> 11/25 Amoxicillin 11/25 >> current    DVT prophylaxis: Coumadin  Code Status:   Code Status: Full Code  Disposition Plan: Stable for discharge on Monday if bed avail at rehab  Status is: Inpatient  Objective: Blood pressure 138/72, pulse 72,  temperature 97.9 F (36.6 C), temperature source Oral, resp. rate 18, height 5\' 8"  (1.727 m), weight 86.1 kg, SpO2 96 %.  Examination:  Physical  Exam Constitutional:      Comments: Weak, deconditioned appearing, but NAD  HENT:     Head: Normocephalic and atraumatic.     Mouth/Throat:     Mouth: Mucous membranes are moist.  Eyes:     Extraocular Movements: Extraocular movements intact.  Cardiovascular:     Rate and Rhythm: Normal rate and regular rhythm.  Pulmonary:     Effort: Pulmonary effort is normal. No respiratory distress.     Breath sounds: Rhonchi present.  Abdominal:     General: Bowel sounds are normal. There is no distension.     Palpations: Abdomen is soft.     Tenderness: There is no abdominal tenderness.  Musculoskeletal:        General: Normal range of motion.     Cervical back: Normal range of motion and neck supple. No rigidity or tenderness.  Skin:    General: Skin is warm and dry.  Neurological:     General: No focal deficit present.  Psychiatric:        Mood and Affect: Mood normal.        Behavior: Behavior normal.     Consultants:    Procedures:    Data Reviewed: I have personally reviewed labs and imaging studies     LOS: 5 days  Time spent: Greater than 50% of the 35 minute visit was spent in counseling/coordination of care for the patient as laid out in the A&P.   Dwyane Dee, MD [facility] 09/13/2021, 1:31 PM

## 2021-09-14 ENCOUNTER — Encounter: Payer: Self-pay | Admitting: Internal Medicine

## 2021-09-14 DIAGNOSIS — J189 Pneumonia, unspecified organism: Secondary | ICD-10-CM | POA: Diagnosis not present

## 2021-09-14 DIAGNOSIS — Z515 Encounter for palliative care: Secondary | ICD-10-CM

## 2021-09-14 DIAGNOSIS — G9341 Metabolic encephalopathy: Secondary | ICD-10-CM | POA: Diagnosis not present

## 2021-09-14 DIAGNOSIS — R7881 Bacteremia: Secondary | ICD-10-CM | POA: Diagnosis not present

## 2021-09-14 DIAGNOSIS — N179 Acute kidney failure, unspecified: Secondary | ICD-10-CM | POA: Diagnosis not present

## 2021-09-14 DIAGNOSIS — R5381 Other malaise: Secondary | ICD-10-CM

## 2021-09-14 DIAGNOSIS — Z7189 Other specified counseling: Secondary | ICD-10-CM

## 2021-09-14 LAB — PROTIME-INR
INR: 2.2 — ABNORMAL HIGH (ref 0.8–1.2)
Prothrombin Time: 24.1 seconds — ABNORMAL HIGH (ref 11.4–15.2)

## 2021-09-14 LAB — SARS CORONAVIRUS 2 (TAT 6-24 HRS): SARS Coronavirus 2: NEGATIVE

## 2021-09-14 MED ORDER — WARFARIN SODIUM 5 MG PO TABS
5.0000 mg | ORAL_TABLET | Freq: Every day | ORAL | Status: DC
  Administered 2021-09-14 – 2021-09-16 (×3): 5 mg via ORAL
  Filled 2021-09-14 (×5): qty 1

## 2021-09-14 NOTE — Progress Notes (Signed)
Physical Therapy Treatment Patient Details Name: Alexander Lane MRN: [id number] DOB: 09/06/1930 Today's Date: 09/14/2021   History of Present Illness Pt is a 87 y.o. adult with medical history significant of large brain meningioma, HTN, HLD, GERD, DVT and A. fib on Coumadin, CKD-3a, BPH, who presents with AMS, cough, SOB, right leg weakness and fall. MD assessment includes: community aquired pneumonia, sepsis, fall at home, and acute metabolic encephalopathy.   PT Comments    Pt was pleasant and motivated to participate during the session and put forth good effort throughout. Pt was able to complete ther ex in bed with heavy cuing. Pt required min assist for bed mobility for RLE control. Pt required mod assist for STS when bed was in lowest setting, but once bed was raised, he was able to STS with min assist. Pt was able to take lateral steps at EOB and few steps away from EOB with mod assist and heavy cuing for sequencing and advancement of the RLE and RW. Pt was hardly able to WB through RLE and demonstrated R foot drag during ambulation. Pt was unable to continue ambulation due to weakness and inability to properly sequence gait with RW. Pt will benefit from PT services in a SNF setting upon discharge to safely address deficits listed in patient problem list for decreased caregiver assistance and eventual return to PLOF.   Recommendations for follow up therapy are one component of a multi-disciplinary discharge planning process, led by the attending physician.  Recommendations may be updated based on patient status, additional functional criteria and insurance authorization.  Follow Up Recommendations  Skilled nursing-short term rehab (<3 hours/day)     Assistance Recommended at Discharge Frequent or constant Supervision/Assistance  Equipment Recommendations  None recommended by PT    Recommendations for Other Services       Precautions / Restrictions Precautions Precautions:  Fall Restrictions Weight Bearing Restrictions: No     Mobility  Bed Mobility Overal bed mobility: Needs Assistance Bed Mobility: Supine to Sit;Sit to Supine     Supine to sit: HOB elevated;Min guard Sit to supine: Min assist   General bed mobility comments: Pt required min assist with RLE control, increased time and effort    Transfers Overall transfer level: Needs assistance Equipment used: Rolling walker (2 wheels) Transfers: Sit to/from Stand Sit to Stand: Mod assist;Min assist           General transfer comment: Increased time and effort, mod assist for STS from lowest bed setting, min assist for STS from elevated bed surface    Ambulation/Gait Ambulation/Gait assistance: Mod assist Gait Distance (Feet): 5 Feet Assistive device: Rolling walker (2 wheels) Gait Pattern/deviations: Step-to pattern;Decreased step length - right;Decreased step length - left;Decreased stance time - right;Decreased stride length;Decreased weight shift to right Gait velocity: decreased     General Gait Details: Pt required heavy cuing for RW usage and proper sequencing for foot and hand placement, heavy cuing for advancing RLE and RW, hardly WB through RLE, only able to take lateral steps at EOB and few steps away from EOB   Stairs             Wheelchair Mobility    Modified Rankin (Stroke Patients Only)       Balance Overall balance assessment: Needs assistance Sitting-balance support: Bilateral upper extremity supported;Feet supported Sitting balance-Leahy Scale: Good     Standing balance support: Bilateral upper extremity supported;During functional activity;Reliant on assistive device for balance Standing balance-Leahy Scale: Fair Standing balance comment:  still noted to drag R LE, poor balance with functional mobility                            Cognition Arousal/Alertness: Awake/alert Behavior During Therapy: WFL for tasks assessed/performed Overall  Cognitive Status: No family/caregiver present to determine baseline cognitive functioning                                          Exercises Total Joint Exercises Ankle Circles/Pumps: AROM;Strengthening;Both;10 reps;Supine Quad Sets: AROM;Both;5 reps;Supine Long Arc Quad: AROM;Strengthening;Both;10 reps;Seated Other Exercises Other Exercises: Pt education on benefits of sitting upright in recliner    General Comments        Pertinent Vitals/Pain Pain Assessment: No/denies pain    Home Living                          Prior Function            PT Goals (current goals can now be found in the care plan section) Acute Rehab PT Goals Patient Stated Goal: go home PT Goal Formulation: With patient Time For Goal Achievement: 09/22/21 Potential to Achieve Goals: Fair Progress towards PT goals: Progressing toward goals    Frequency    Min 2X/week      PT Plan Current plan remains appropriate    Co-evaluation              AM-PAC PT "6 Clicks" Mobility   Outcome Measure  Help needed turning from your back to your side while in a flat bed without using bedrails?: A Little Help needed moving from lying on your back to sitting on the side of a flat bed without using bedrails?: A Little Help needed moving to and from a bed to a chair (including a wheelchair)?: A Lot Help needed standing up from a chair using your arms (e.g., wheelchair or bedside chair)?: A Lot Help needed to walk in hospital room?: A Lot Help needed climbing 3-5 steps with a railing? : Total 6 Click Score: 13    End of Session Equipment Utilized During Treatment: Gait belt Activity Tolerance: Patient tolerated treatment well Patient left: in bed;with call bell/phone within reach;with bed alarm set Nurse Communication: Mobility status PT Visit Diagnosis: Unsteadiness on feet (R26.81);Muscle weakness (generalized) (M62.81);History of falling (Z91.81);Difficulty in walking,  not elsewhere classified (R26.2)     Time: 1610-9604 PT Time Calculation (min) (ACUTE ONLY): 32 min  Charges:                        Sheldon Silvan SPT 09/14/21, 5:36 PM

## 2021-09-14 NOTE — Progress Notes (Signed)
Progress Note    Alexander Lane   ZMO:[id number]  DOB: [birth year]  DOA: 09/08/2021     6 PCP: Rusty Aus, MD  Initial CC: weakness, falling, cough  Hospital Course: Alexander Lane is an 87 yo male with PMH  large brain meningioma, HTN, HLD, GERD, DVT and A. fib on Coumadin, CKD-3a, BPH, who presented with AMS, cough, SOB, right leg weakness and fall.   He was noted to be febrile on arrival to the ER, 101.4; he was also reported to have progressive worsening weakness at home per his wife and worsened mentation. He underwent work-up on admission.  CT head showed his known large left frontal extra-axial mass and appeared essentially similar in size to prior imaging with underlying mass-effect and edema involving left frontal lobe. CXR showed a mild airspace opacity involving the left hemidiaphragm new from prior imaging concerning for possible pneumonia. Blood cultures were obtained on admission and became positive in 3/4 bottles for strep pneumonia.  Interval History:  No events overnight.  Resting in bed in no distress.  Working on disposition planning still regarding going to rehab.  Palliative care evaluated today as well.  Assessment & Plan: * CAP (community acquired pneumonia) - Small airspace opacity noted along left hemidiaphragm on CXR.  Given strep pneumonia bacteremia, pulmonary source may be culprit - s/p Rocephin; sens reviewed; will de-escalate to amoxicillin on 11/25 to complete course  - Follow-up urinary strep pneumo antigen and Legionella -Trend procalcitonin (0.23>>2.22>>1.76)  Severe sepsis (HCC) - Febrile, tachycardia, tachypnea, leukocytosis, altered mentation; source presumed pulmonary along with seeding of blood -Repeat blood cultures on 09/10/2021; negative x 1 day - Continue abx - See pneumonia  Bacteremia due to Gram-positive bacteria - 3/4 bottles from 09/08/21 growing Strep pna; for now suspecting translocation from presumed PNA - repeat blood  cultures on 11/24; negative x 1 day - s/p rocephin - d/c rocephin and start amoxicillin; goal 10 days total course (end date 09/17/21)  Physical deconditioning - Likely multifactorial from underlying meningioma and overall deconditioning from underlying bacteremia - PT/OT eval's: SNF recommended   Acute metabolic encephalopathy-resolved as of 09/11/2021 - patient symptoms include AMS, weakness - etiology considered due to metabolic derangements likely in setting of underlying infections  H/O meningioma of the brain - mostly same size on CTH; no surgical intervention when last seen by neurosurgery (wife decline surgical intervention) - given CTH findings, patient started back on keppra and decadron this admission; d/c decadron on 11/26, continue keppra  Acute renal failure superimposed on stage 3a chronic kidney disease ([place]) - patient has history of CKD3a. Baseline creat ~ 1 - 1.3.  - patient presents with increase in creat >0.3 mg/dL above baseline, creat increase >1.5x baseline presumed to have occurred within past 7 days PTA - creatinine 1.7 on admission - s/p IVF - resume ARB and lasix on 11/27 - creat back to baseline  PAF (paroxysmal atrial fibrillation) ([place]) - continue coumadin per pharmacy - lopressor PRN for rate control   Fall at home, initial encounter - see weakness  HLD (hyperlipidemia) - continue zocor  Hypertension - Resume ARB - Continue PRN hydralazine   BPH (benign prostatic hyperplasia) - Continue terazosin    Old records reviewed in assessment of this patient  Antimicrobials: Azithromycin 09/08/21  x 1 Rocephin 11/22 >> 11/25 Amoxicillin 11/25 >> current    DVT prophylaxis: Coumadin  Code Status:   Code Status: Full Code  Disposition Plan: Stable for discharge when bed available Status is:  Inpatient  Objective: Blood pressure 113/75, pulse 75, temperature 98.4 F (36.9 C), temperature source Oral, resp. rate 20, height 5\' 8"  (1.727 m),  weight 86.1 kg, SpO2 97 %.  Examination:  Physical Exam Constitutional:      Comments: Weak, deconditioned appearing, but NAD  HENT:     Head: Normocephalic and atraumatic.     Mouth/Throat:     Mouth: Mucous membranes are moist.  Eyes:     Extraocular Movements: Extraocular movements intact.  Cardiovascular:     Rate and Rhythm: Normal rate and regular rhythm.  Pulmonary:     Effort: Pulmonary effort is normal. No respiratory distress.     Breath sounds: Rhonchi present.  Abdominal:     General: Bowel sounds are normal. There is no distension.     Palpations: Abdomen is soft.     Tenderness: There is no abdominal tenderness.  Musculoskeletal:        General: Normal range of motion.     Cervical back: Normal range of motion and neck supple. No rigidity or tenderness.  Skin:    General: Skin is warm and dry.  Neurological:     General: No focal deficit present.  Psychiatric:        Mood and Affect: Mood normal.        Behavior: Behavior normal.     Consultants:    Procedures:    Data Reviewed: I have personally reviewed labs and imaging studies     LOS: 6 days  Time spent: Greater than 50% of the 35 minute visit was spent in counseling/coordination of care for the patient as laid out in the A&P.   Dwyane Dee, MD [facility] 09/14/2021, 1:53 PM

## 2021-09-14 NOTE — Consult Note (Signed)
Consultation Note Date: 09/14/2021   Patient Name: Alexander Lane  DOB: [birth year]  MRN: [id number]  Age / Sex: 87 y.o., adult  PCP: Alexander Aus, MD Referring Physician: Dwyane Dee, MD  Reason for Consultation: Establishing goals of care  HPI/Patient Profile: 87 y.o. adult  with past medical history of large brain hemangioma, HTN/HLD, GERD, DVT right leg with IVC filter 2014, A. fib on Coumadin, CKD 3, BPH admitted on 09/08/2021 with community-acquired pneumonia with severe sepsis, bacteremia due to gram-positive bacteria growing strep pneumoniae.   Clinical Assessment and Goals of Care: I have reviewed medical records including EPIC notes, labs and imaging, received report from RN, assessed the patient.  Alexander Lane is lying quietly in bed.  He greets me, making and mostly keeping eye contact.  He is alert and oriented to person and place, but not month.  I believe that he is able to make his basic needs known.  There is no family at bedside at this time.    We talked about his life at home.  He tells me that he is able to manage all of his ADLs independently, but that his wife does the cooking and cleaning.  He shares that daughter Alexander Lane manages their [employer].  He is scheduled for MB BS 11/29, and is currently on nectar thick liquids, dysphagia 3 diet. We talked about healthcare power of attorney, he tells me that his wife and his daughters would be surrogate decision makers.  Advanced directives, concepts specific to code status,  were considered and discussed briefly with Alexander Lane.  At this point he tells me that he would like full scope/full code, but agrees for me to discuss this with his wife and daughter.  At this point he tells me that he would want full scope/full code.  I encouraged him to think about his choices, how long he would want to be on life support and share this with his wife.  We  talked about disposition.  He tells me that he would prefer to go home. Alexander Lane agrees for me to call his wife.  Discussed the importance of continued conversation with family and the medical providers regarding overall plan of care and treatment options, ensuring decisions are within the context of the patient's values and GOCs.  Questions and concerns were addressed.  The family was encouraged to call with questions or concerns.  PMT will continue to support holistically.  Call to wife, Alexander Lane to discuss diagnosis prognosis, [place], EOL wishes, disposition and options.  No answer, left voicemail message.   Conference with attending, bedside nursing staff, transition of care team related to patient condition, needs, goals of care, disposition.   HCPOA  NEXT OF KIN -Alexander Lane names his wife and daughters Alexander Lane and Alexander Lane, as his healthcare surrogates.       SUMMARY OF RECOMMENDATIONS   At this point continue to treat the treatable. Family is requesting short-term rehab Tentatively scheduled for MB BS 11/29  Code Status/Advance Care Planning: Full  code -would clearly benefit from further [place] discussions.  Symptom Management:  Per hospitalist, no additional needs at this time.  Palliative Prophylaxis:  Frequent Pain Assessment and Oral Care  Additional Recommendations (Limitations, Scope, Preferences): Full Scope Treatment  Psycho-social/Spiritual:  Desire for further Chaplaincy support:no Additional Recommendations: Caregiving  Support/Resources and Education on Hospice  Prognosis:  Unable to determine, based on outcomes.  Somewhat guarded at this point due to advanced age, swallowing difficulties, aspiration pneumonia.  Discharge Planning:  To be determined, anticipate discharge to short-term rehab.  Would benefit from outpatient palliative services.       Primary Diagnoses: Present on Admission:  CAP (community acquired pneumonia)  (Resolved) Acute  metabolic encephalopathy  Acute renal failure superimposed on stage 3a chronic kidney disease (HCC)  HLD (hyperlipidemia)  Hypertension  PAF (paroxysmal atrial fibrillation) (HCC)  BPH (benign prostatic hyperplasia)  Severe sepsis (HCC)  Right leg weakness   I have reviewed the medical record, interviewed the patient and family, and examined the patient. The following aspects are pertinent.  Past Medical History:  Diagnosis Date   BPH (benign prostatic hyperplasia)    Chronic kidney disease    DVT (deep venous thrombosis) ([place]) 2014   right leg, IVC filter   Dysrhythmia 2019   Paroxysmal atrial fibrillation   Hypertension    Social History   Socioeconomic History   Marital status: Married    Spouse name: Not on file   Number of children: Not on file   Years of education: Not on file   Highest education level: Not on file  Occupational History   Not on file  Tobacco Use   Smoking status: Former   Smokeless tobacco: Never  Substance and Sexual Activity   Alcohol use: Never   Drug use: Never   Sexual activity: Not on file  Other Topics Concern   Not on file  Social History Narrative   Not on file   Social Determinants of Health   Financial Resource Strain: Not on file  Food Insecurity: Not on file  Transportation Needs: Not on file  Physical Activity: Not on file  Stress: Not on file  Social Connections: Not on file   Family History  Problem Relation Age of Onset   Melanoma Brother    Scheduled Meds:  amoxicillin  1,000 mg Oral Q8H   furosemide  20 mg Oral Daily   irbesartan  150 mg Oral Daily   levETIRAcetam  250 mg Oral BID   pantoprazole  40 mg Oral BID   simvastatin  20 mg Oral q1800   terazosin  5 mg Oral QHS   warfarin  5 mg Oral q1600   Warfarin - Pharmacist Dosing Inpatient   Does not apply q1600   Continuous Infusions: PRN Meds:.acetaminophen, albuterol, dextromethorphan-guaiFENesin, hydrALAZINE, metoprolol tartrate, ondansetron (ZOFRAN)  IV Medications Prior to Admission:  Prior to Admission medications   Medication Sig Start Date End Date Taking? Authorizing Provider  amLODipine (NORVASC) 2.5 MG tablet Take 2.5 mg by mouth at bedtime.   Yes [provider]  furosemide (LASIX) 20 MG tablet Take 20 mg by mouth daily. 07/06/21  Yes [provider]  olmesartan (BENICAR) 20 MG tablet Take 20 mg by mouth daily.   Yes [provider]  simvastatin (ZOCOR) 20 MG tablet Take 20 mg by mouth at bedtime.   Yes [provider]  terazosin (HYTRIN) 5 MG capsule Take 5 mg by mouth at bedtime.   Yes [provider]  warfarin (  COUMADIN) 5 MG tablet Take 5 mg by mouth every evening.   Yes [provider]  acetaminophen (TYLENOL) 500 MG tablet Take 500-1,000 mg by mouth every 6 (six) hours as needed for mild pain or moderate pain.    [provider]  ipratropium (ATROVENT HFA) 17 MCG/ACT inhaler Inhale 2 puffs into the lungs every 4 (four) hours. Patient not taking: Reported on 09/08/2021 03/18/21   Annita Brod, MD  pantoprazole (PROTONIX) 40 MG tablet Take 40 mg by mouth 2 (two) times daily.    [provider]   Allergies  Allergen Reactions   Celecoxib Rash   Review of Systems  Unable to perform ROS: Age   Physical Exam Vitals and nursing note reviewed.  Constitutional:      General: He is not in acute distress.    Appearance: He is ill-appearing.  HENT:     Head: Normocephalic and atraumatic.     Mouth/Throat:     Mouth: Mucous membranes are moist.  Cardiovascular:     Rate and Rhythm: Normal rate.  Pulmonary:     Effort: Pulmonary effort is normal. No respiratory distress.     Comments: Productive cough, frequent throat clearing Neurological:     Mental Status: He is alert.     Comments: Oriented to person, place, but not month  Psychiatric:        Mood and Affect: Mood normal.        Behavior: Behavior normal.     Comments: Calm and cooperative     Vital Signs: BP 113/75 (BP Location: Right Arm)   Pulse 75   Temp 98.4 F (36.9 C) (Oral)   Resp 20   Ht 5\' 8"  (1.727 m)   Wt 86.1 kg   SpO2 97%   BMI 28.86 kg/m  Pain Scale: 0-10 POSS *See Group Information*: 1-Acceptable,Awake and alert Pain Score: 0-No pain   SpO2: SpO2: 97 % O2 Device:SpO2: 97 % O2 Flow Rate: .   IO: Intake/output summary:  Intake/Output Summary (Last 24 hours) at 09/14/2021 1229 Last data filed at 09/14/2021 1105 Gross per 24 hour  Intake 240 ml  Output 1650 ml  Net -1410 ml    LBM: Last BM Date: 09/14/21 Baseline Weight: Weight: 86.1 kg Most recent weight: Weight: 86.1 kg     Palliative Assessment/Data:   Flowsheet Rows    Flowsheet Row Most Recent Value  Intake Tab   Referral Department Hospitalist  Unit at Time of Referral Med/Surg Unit  Palliative Care Primary Diagnosis Pulmonary  Date Notified 09/08/21  Palliative Care Type Return patient Palliative Care  Reason for referral Clarify Goals of Care  Date of Admission 09/08/21  Date first seen by Palliative Care 09/14/21  # of days Palliative referral response time 6 Day(s)  # of days IP prior to Palliative referral 0  Clinical Assessment   Palliative Performance Scale Score 40%  Pain Max last 24 hours Not able to report  Pain Min Last 24 hours Not able to report  Dyspnea Max Last 24 Hours Not able to report  Dyspnea Min Last 24 hours Not able to report  Psychosocial & Spiritual Assessment   Palliative Care Outcomes        Time In: 0850 Time Out: 0940 Time Total: 50 minutes  Greater than 50%  of this time was spent counseling and coordinating care related to the above assessment and plan.  Signed by: Drue Novel, NP   Please contact Palliative Medicine Team phone  at [phone] for questions and concerns.  For individual provider: See Shea Evans

## 2021-09-14 NOTE — TOC Progression Note (Signed)
Transition of Care [facility]) - Progression Note    Patient Details  Name: AD GUTTMAN MRN: [id number] Date of Birth: 23-Oct-1929  Transition of [facility]) CM/SW Drain, RN Phone Number: 09/14/2021, 11:29 AM  Clinical Narrative:  Patient has no bed offers today.  RNCM re-sent SNF inquiries, awaiting responses.  TOC to follow.     Expected Discharge Plan: [place] Barriers to Discharge: Continued Medical Work up  Expected Discharge Plan and Services Expected Discharge Plan: [place]   Discharge Planning Services: CM Consult Post Acute Care Choice: Independence Living arrangements for the past 2 months: Single Family Home                 DME Arranged: N/A DME Agency: NA       HH Arranged: NA HH Agency: NA         Social Determinants of Health (SDOH) Interventions    Readmission Risk Interventions Readmission Risk Prevention Plan 09/11/2021  Transportation Screening Complete  PCP or Specialist Appt within 3-5 Days Complete  HRI or [place] Complete  Social Work Consult for [place] Planning/Counseling Complete  Palliative Care Screening Not Applicable  Medication Review Press photographer) Complete  Some recent data might be hidden

## 2021-09-14 NOTE — Consult Note (Signed)
Alexander Lane for warfarin  Indication: atrial fibrillation and DVT  Allergies  Allergen Reactions   Celecoxib Rash    Patient Measurements: Height: 5\' 8"  (172.7 cm) Weight: 86.1 kg (189 lb 13.1 oz) IBW/kg (Calculated) : 68.4  Vital Signs: Temp: 98.5 F (36.9 C) (11/28 0754) Temp Source: Oral (11/28 0754) BP: 124/64 (11/28 0834) Pulse Rate: 69 (11/28 0754)  Labs: Recent Labs    09/12/21 0404 09/13/21 0427 09/14/21 0431  HGB 10.8* 11.0*  --   HCT 32.8* 32.9*  --   PLT 264 251  --   LABPROT 21.6* 24.3* 24.1*  INR 1.9* 2.2* 2.2*  CREATININE 1.05 1.05  --     Estimated Creatinine Clearance (by C-G formula based on SCr of 1.05 mg/dL) Male: 41.7 mL/min Male: 50.9 mL/min  Medical History: Past Medical History:  Diagnosis Date   BPH (benign prostatic hyperplasia)    Chronic kidney disease    DVT (deep venous thrombosis) ([place]) 2014   right leg, IVC filter   Dysrhythmia 2019   Paroxysmal atrial fibrillation   Hypertension     Medications:  Scheduled:   amoxicillin  1,000 mg Oral Q8H   furosemide  20 mg Oral Daily   irbesartan  150 mg Oral Daily   levETIRAcetam  250 mg Oral BID   pantoprazole  40 mg Oral BID   simvastatin  20 mg Oral q1800   terazosin  5 mg Oral QHS   Warfarin - Pharmacist Dosing Inpatient   Does not apply q1600    Assessment: 87yo male with PMH of BPH, CKD, DVT, PAF, and HTN who was admitted to the hospital for cough and SOB. Patient is on warfarin for hx of DVT and PAF. Pharmacy was consulted for warfarin management.   PTA warfarin regimen: 5mg  daily (35mg  weekly)  Drug interactions: ceftriaxone switched to amoxicillin (new DDI with warfarin)  Date  INR Interpretation/dose 11/22  1.6 Subthera, 5 mg given 11/23  1.7 Subthera, 5 mg given 11/24  1.7 Subthera, 6 mg  11/25  1.9 Subthera, 5 mg 11/26 1.9  Subthera, 6 mg  11/27 2.2 Therapeutic, 5 mg  11/28   2.2 Therapeutic, 5mg  daily  Goal of Therapy:   INR 2-3 Monitor platelets by anticoagulation protocol: Yes   Plan:  INR therapeutic x 2, hemoglobin and platelets remain stable. Will order warfarin 5mg  daily Continue daily INR and CBC at least every 3 days  Mirielle Byrum Rodriguez-Guzman PharmD, BCPS 09/14/2021 8:40 AM

## 2021-09-14 NOTE — Progress Notes (Signed)
Speech Language Pathology Treatment:    Patient Details Name: Alexander Lane MRN: [id number] DOB: 11/20/1929 Today's Date: 09/14/2021 Time: 8295-6213 SLP Time Calculation (min) (ACUTE ONLY): 12 min  Assessment / Plan / Recommendation Clinical Impression  Pt seen for diet tolerance. Pt alert, pleasant, and cooperative. Upright in bed. On room air. Pt with infrequent baseline congested cough prior to PO intake.   Pt observed with mech soft solids from meal tray and nectar-thick liquids via cup sip. Oral phase was functional across trials. No overt s/sx pharyngeal dysphagia with mech soft solids or nectar-thick liquids. Pt stating, "this goes down good." Pt appears to be tolerating current diet without observed or subjective reports of swallowing difficulty by pt, RN, or SLP.  Per chart review, temp and WBC WNL. No recent chest imaging.   Recommend continuation of mech soft diet with nectar-thick liquids and safe swallowing strategies/aspiration precautions as outlined below.  Pt educated re: diet recommendations, safe swallowing strategies/aspiration precautions, recommendation for MBSS, MBSS procedure, and SLP POC. Pt verbalized understanding and agreement. RN and MD made aware of SLP POC and recommendations. Pt may benefit from reinforcement of content by medical team given seemingly reduced functional recall during informal conversational exchanges.   Given pt's clinical presentation on 85/27/22 in setting of recent PNA, will proceed with MBSS tentatively scheduled for 11/29 to instrumentally evaluate pt's oropharyngeal swallowing function prior to d/c.    HPI HPI: Per 85 H&P "Alexander Lane is a 87 y.o. adult with medical history significant of large brain hemangioma, HTN, HLD, GERD, DVT and A. fib on Coumadin, CKD-3a, BPH, who presents with AMS, cough, SOB, right leg weakness and fall.     Patient has altered mental status, cannot provide accurate medical history.  Per his wife  (I called his wife by phone), pt has cough and shortness of breath, does not seem to have chest pain.  He has fever with temperature of 101.4 in ED.  No active nausea, vomiting, diarrhea or abdominal pain.  No symptoms of UTI.  Initially patient was not confused, but later on pt developed confusion. He looks very weak.  Per his wife, patient normally is orientated x3, can use walker to walk.  This morning patient developed right leg weakness, cannot move right leg normally.  No facial droop or slurred speech. Per his wife, pt slided out the bed, but not have significant fall or injury." CXR 09/08/21 "1. Mild airspace opacity along the left hemidiaphragm is new from  03/13/2021, and nonspecific for atelectasis versus or early  pneumonia.  2. Small hiatal hernia.  3.  Aortic Atherosclerosis (ICD10-I70.0).  4. Thoracic spondylosis." Head CT 09/08/21 "1. The large left frontal extra-axial mass appears similar versus  mildly increased in size; however, evaluation is limited on this  noncontrast head CT. Similar extensive mass effect and edema in the  left frontal lobe with approximately 11 mm of subfalcine herniation.  An MRI with contrast could provide more sensitive evaluation for  progression if clinically indicated.  2. No evidence of superimposed/interval acute abnormality."      SLP Plan  New goals to be determined pending instrumental study      Recommendations for follow up therapy are one component of a multi-disciplinary discharge planning process, led by the attending physician.  Recommendations may be updated based on patient status, additional functional criteria and insurance authorization.    Recommendations  Diet recommendations: Dysphagia 3 (mechanical soft);Nectar-thick liquid Liquids provided via: Straw Medication Administration: Crushed with puree  Supervision: Patient able to self feed;Intermittent supervision to cue for compensatory strategies Compensations: Minimize environmental  distractions;Slow rate;Small sips/bites Postural Changes and/or Swallow Maneuvers: Out of bed for meals;Seated upright 90 degrees;Upright 30-60 min after meal                Oral Care Recommendations: Oral care BID;Staff/trained caregiver to provide oral care Follow Up Recommendations: Skilled nursing-short term rehab (<3 hours/day) Assistance recommended at discharge: Frequent or constant Supervision/Assistance SLP Visit Diagnosis: Dysphagia, oropharyngeal phase (R13.12) Plan: New goals to be determined pending instrumental study       Cherrie Gauze, M.S., [facility] [phone] ([place])                 Quintella Baton  09/14/2021, 10:25 AM

## 2021-09-15 ENCOUNTER — Inpatient Hospital Stay: Payer: Medicare Other

## 2021-09-15 DIAGNOSIS — A419 Sepsis, unspecified organism: Secondary | ICD-10-CM | POA: Diagnosis not present

## 2021-09-15 DIAGNOSIS — Z515 Encounter for palliative care: Secondary | ICD-10-CM | POA: Diagnosis not present

## 2021-09-15 DIAGNOSIS — R6520 Severe sepsis without septic shock: Secondary | ICD-10-CM

## 2021-09-15 DIAGNOSIS — J189 Pneumonia, unspecified organism: Secondary | ICD-10-CM | POA: Diagnosis not present

## 2021-09-15 DIAGNOSIS — N179 Acute kidney failure, unspecified: Secondary | ICD-10-CM | POA: Diagnosis not present

## 2021-09-15 DIAGNOSIS — R7881 Bacteremia: Secondary | ICD-10-CM | POA: Diagnosis not present

## 2021-09-15 DIAGNOSIS — Z7189 Other specified counseling: Secondary | ICD-10-CM | POA: Diagnosis not present

## 2021-09-15 LAB — CULTURE, BLOOD (ROUTINE X 2)
Culture: NO GROWTH
Culture: NO GROWTH
Special Requests: ADEQUATE
Special Requests: ADEQUATE

## 2021-09-15 LAB — PROTIME-INR
INR: 2.1 — ABNORMAL HIGH (ref 0.8–1.2)
Prothrombin Time: 24 seconds — ABNORMAL HIGH (ref 11.4–15.2)

## 2021-09-15 MED ORDER — SODIUM CHLORIDE 0.9 % IV SOLN
2.0000 g | INTRAVENOUS | Status: AC
  Administered 2021-09-15 – 2021-09-17 (×12): 2 g via INTRAVENOUS
  Filled 2021-09-15: qty 2000
  Filled 2021-09-15 (×3): qty 2
  Filled 2021-09-15: qty 2000
  Filled 2021-09-15: qty 2
  Filled 2021-09-15: qty 2000
  Filled 2021-09-15 (×5): qty 2

## 2021-09-15 MED ORDER — SODIUM CHLORIDE 0.9 % IV SOLN
250.0000 mg | Freq: Two times a day (BID) | INTRAVENOUS | Status: DC
  Administered 2021-09-15 – 2021-09-17 (×4): 250 mg via INTRAVENOUS
  Filled 2021-09-15 (×7): qty 2.5

## 2021-09-15 MED ORDER — GADOBUTROL 1 MMOL/ML IV SOLN
7.5000 mL | Freq: Once | INTRAVENOUS | Status: AC | PRN
  Administered 2021-09-15: 7.5 mL via INTRAVENOUS

## 2021-09-15 NOTE — Evaluation (Addendum)
Objective Swallowing Evaluation: Type of Study: MBS-Modified Barium Swallow Study   Patient Details  Name: Alexander Lane MRN: [id number] Date of Birth: 05/05/1930  Today's Date: 09/15/2021 Time: SLP Start Time (ACUTE ONLY): 1210 -SLP Stop Time (ACUTE ONLY): 1300  SLP Time Calculation (min) (ACUTE ONLY): 50 min   Past Medical History:  Past Medical History:  Diagnosis Date   BPH (benign prostatic hyperplasia)    Chronic kidney disease    DVT (deep venous thrombosis) ([place]) 2014   right leg, IVC filter   Dysrhythmia 2019   Paroxysmal atrial fibrillation   Hypertension    Past Surgical History:  Past Surgical History:  Procedure Laterality Date   ESOPHAGOGASTRODUODENOSCOPY (EGD) WITH PROPOFOL N/A 03/06/2021   Procedure: ESOPHAGOGASTRODUODENOSCOPY (EGD) WITH PROPOFOL;  Surgeon: [name], MD;  Location: [facility] ENDOSCOPY;  Service: Endoscopy;  Laterality: N/A;   TONSILLECTOMY     HPI: Per [id number] H&P "Alexander Lane is a 87 y.o. adult with medical history significant of large brain hemangioma, HTN, HLD, GERD, DVT and A. fib on Coumadin, CKD-3a, BPH, who presents with AMS, cough, SOB, right leg weakness and fall.  Patient has altered mental status, cannot provide accurate medical history.  Per his wife (I called his wife by phone), pt has cough and shortness of breath, does not seem to have chest pain.  He has fever with temperature of 101.4 in ED.  No active nausea, vomiting, diarrhea or abdominal pain.  No symptoms of UTI.  Initially patient was not confused, but later on pt developed confusion. He looks very weak.  Per his wife, patient normally is orientated x3, can use walker to walk.  This morning patient developed right leg weakness, cannot move right leg normally.  No facial droop or slurred speech. Per his wife, pt slided out the bed, but not have significant fall or injury." CXR 09/08/21 "1. Mild airspace opacity along the left hemidiaphragm is new from  03/13/2021,  and nonspecific for atelectasis versus or early  pneumonia.  2. Small hiatal hernia.  3.  Aortic Atherosclerosis (ICD10-I70.0).  4. Thoracic spondylosis.".   Head CT 09/08/21 "1. The large left frontal extra-axial mass appears similar versus mildly increased in size; however, evaluation is limited on this  noncontrast head CT. Similar extensive mass effect and edema in the  left frontal lobe with approximately 11 mm of subfalcine herniation.  An MRI with contrast could provide more sensitive evaluation for  progression if clinically indicated.  2. No evidence of superimposed/interval acute abnormality.".   MRI on 09/15/2021 revealed: "Mildly increased size of left frontal extra-axial mass compatible  with meningioma since 03/08/2021. Extensive left frontal lobe edema  with mildly increased rightward midline shift, now 1.8 cm.  2. Moderate chronic small vessel ischemic disease.  3. No acute infarct.". OF NOTE: pt had recent EGD w/ Dilation in 02/2021 for Stricture -- he was unable to f/u for further Dilation per GI POC d/t illness per report.     Subjective: Pt alert, pleasant, and cooperative. Congested, non-productive cough and phlegmy vocal quality at baseline. Requires MOD cues.     Recommendations for follow up therapy are one component of a multi-disciplinary discharge planning process, led by the attending physician.  Recommendations may be updated based on patient status, additional functional criteria and insurance authorization.  Assessment / Plan / Recommendation  Clinical Impressions 09/15/2021  Clinical Impression Pt appears to present w/ Moderate-Severe oropharyngeal phase Dsyphagia w/ Esophageal phase Dysmotility. He is at increased risk for  aspiration of bolus material during and after the swallow(w/ even risk for aspiration of backflow material from the Esophagus), thus at increased risk for Pulmonary impact and decline from negative sequelae of aspiration. Pt's ability to meet overall  nutrition/hydration needs could be hampered by his oropharyngeal and Esophgeal phase Dysphagia also.    Pt has a reported h/o Esophageal phase Dysmotility w/ recent Dilation of stricture in 02/2021 but was unable to f/u for further Dilation d/t "illness"(Wife stated he wasn't "strong enough to go back") -- any Esophageal phase dysmotility can impact the oropharyngeal phases of swallowing(timing) and increase risk for dysmotility, backflow w/ aspiration of retrograde bolus activity.    During the oral phase, pt exhibited reduced lingual control and organization for cohesive bolus manipulation in timely manner w/ increased textures; premature spillage and loss of bolus into the pharynx noted w/ liquid trials, especially thin liquids. R/L lateral sulci residue noted w/ thin liquids. Min+ diffuse oral/lingual residue noted w/ all consistencies of increased texture. Pt was able to reduce the oral residue w/ f/u DRY swallows, moreso nonvolitional swallowing b/t trials given Time. Pt required Verbal cues for follow through w/ swallowing strategies, and follow through w/ inconsistent.    During the pharyngeal phase, pharyngeal swallow revealed delayed initiation moreso w/ liquids, especially thin liquids spilling to, and filling, the Pyriform Sinuses. This, along w/ reduced airway closure, resulted in Aspiration before and during the swallowing. Delayed, congested coughing followed; aspirated material remained in viewable trachea along anterior wall. Also noted: reduced hyolaryngeal excursion and anterior movement w/ decreased pharyngeal peristalsis, reduced tongue base strength, reduced airway closure/protection, residual bolus material in the Vallecule, on BOT, and along lateral channels; inconsistent in the pyriform sinuses. Compensatory strategies attempted: Time allowed for f/u, DRY swallowing b/t trials to reduce pharyngeal residue. Improved Timing of pharyngeal swallow was noted w/ increased textures including  Nectar/Honey liquids and Foods; laryngeal Penetration was noted w/ Nectar consistency trials during and after the swallow.  Pt could not consistently follow instructions for swallowing strategies though he tried. Unsure of consistent carry-over of strategies w/out 100% Supervision and cueing.  During the Esophageal phase, bolus stasis w/ trials of increased textures was noted in the Cervical Esophagus below the PE segment just above shoulders; slow clearing post swallow. Time given b/t trials to aid reducing/clearing. This Dysmotility can impact pharyngeal motility and clearing as well as increase risk for Backflow of material from the Esophagus to the Pharynx.  Pt was unable to consistently follow through w/ compensatory strategies and aspiration precautions such as strong cough/throat clear and DRY swallow given MOD verbal cues and model. D/t this, strategies and precautions cannot be counted on for consistent use and/or effectiveness for pt. Pt requires 100% Supervision at all meals to increase follow through w/ the precautions/strategies for improved safety w/ oral intake. Due to this, and the above presentation, the recommendation is for a Dysphagia level 2 (MINCED foods) diet w/ HONEY consistency liquids at this time; aspiration precautions and compensatory strategies. Supervision w/ meals for follow through. Recommend repeat MBSS in the next ~3-4 weeks in hopes to upgrade his diet consistency using the strategies/precautions. Pt has multiple medical issues impacting his situation -- see MD/chart notes; MRI of brain. Recommend f/u w/ Palliative Care for ongoing education and discussions for support for both pt/Wife/Family. MD updated.   SLP Visit Diagnosis Dysphagia, oropharyngeal phase (R13.12);Dysphagia, pharyngoesophageal phase (R13.14)  Attention and concentration deficit following --  Frontal lobe and executive function deficit following --  Impact  on safety and function Moderate-Severe  aspiration risk;Risk for inadequate nutrition/hydration      Treatment Recommendations 09/15/2021  Treatment Recommendations Therapy as outlined in treatment plan below     Prognosis 09/15/2021  Prognosis for Safe Diet Advancement Guarded  Barriers to Reach Goals Cognitive deficits;Time post onset;Severity of deficits  Barriers/Prognosis Comment baseline Esophageal phase dysphagia    Diet Recommendations 09/15/2021  SLP Diet Recommendations Dysphagia 2 (Fine chop) solids;Honey thick liquids  Liquid Administration via Cup  Medication Administration Crushed with puree  Compensations Minimize environmental distractions;Slow rate;Small sips/bites;Lingual sweep for clearance of pocketing;Multiple dry swallows after each bite/sip;Follow solids with liquid;Clear throat intermittently  Postural Changes Remain semi-upright after after feeds/meals (Comment);Seated upright at 90 degrees      Other Recommendations 09/15/2021  Recommended Consults Consider GI evaluation;Consider esophageal assessment  Oral Care Recommendations Oral care BID;Oral care before and after PO;Staff/trained caregiver to provide oral care  Other Recommendations Order thickener from pharmacy;Prohibited food (jello, ice cream, thin soups);Remove water pitcher;Have oral suction available  Follow Up Recommendations Skilled nursing-short term rehab (<3 hours/day)  Assistance recommended at discharge Frequent or constant Supervision/Assistance  Functional Status Assessment Patient has had a recent decline in their functional status and/or demonstrates limited ability to make significant improvements in function in a reasonable and predictable amount of time    Frequency and Duration  09/15/2021  Speech Therapy Frequency (ACUTE ONLY) min 2x/week  Treatment Duration 1 week      Oral Phase 09/15/2021  Oral Phase Impaired  Oral - Pudding Teaspoon --  Oral - Pudding Cup --  Oral - Honey Teaspoon Lingual/palatal  residue;Decreased bolus cohesion  Oral - Honey Cup --  Oral - Nectar Teaspoon Right pocketing in lateral sulci;Left pocketing in lateral sulci;Lingual/palatal residue;Decreased bolus cohesion;Premature spillage  Oral - Nectar Cup Right pocketing in lateral sulci;Left pocketing in lateral sulci;Lingual/palatal residue;Decreased bolus cohesion;Premature spillage  Oral - Nectar Straw --  Oral - Thin Teaspoon Right pocketing in lateral sulci;Left pocketing in lateral sulci;Lingual/palatal residue;Decreased bolus cohesion;Premature spillage  Oral - Thin Cup Right pocketing in lateral sulci;Left pocketing in lateral sulci;Lingual/palatal residue;Decreased bolus cohesion;Premature spillage  Oral - Thin Straw --  Oral - Puree Reduced posterior propulsion;Lingual/palatal residue;Delayed oral transit;Decreased bolus cohesion  Oral - Mech Soft Weak lingual manipulation;Reduced posterior propulsion;Lingual/palatal residue  Oral - Regular --  Oral - Multi-Consistency --  Oral - Pill --  Oral Phase - Comment pt exhibited reduced lingual control and organization for cohesive bolus manipulation in timely manner for increased texture; premature spillage and loss of bolus into the pharynx noted w/ liquid trials, especially thin liquids. R/L lateral sulci residue noted w/ thin liquids. Diffuse oral/lingual residue noted w/ all consistencies of increased texture. Pt was able to reduce the oral residue w/ f/u swallows, moreo nonvolitional b/t trials given.    Pharyngeal Phase 09/15/2021  Pharyngeal Phase Impaired  Pharyngeal- Pudding Teaspoon --  Pharyngeal --  Pharyngeal- Pudding Cup --  Pharyngeal --  Pharyngeal- Honey Teaspoon Delayed swallow initiation-vallecula;Reduced pharyngeal peristalsis;Reduced epiglottic inversion;Reduced anterior laryngeal mobility;Reduced laryngeal elevation;Reduced tongue base retraction;Pharyngeal residue - valleculae;Compensatory strategies attempted (with notebox)  Pharyngeal --   Pharyngeal- Honey Cup --  Pharyngeal --  Pharyngeal- Nectar Teaspoon Delayed swallow initiation-pyriform sinuses;Delayed swallow initiation-vallecula;Reduced pharyngeal peristalsis;Reduced epiglottic inversion;Reduced anterior laryngeal mobility;Reduced laryngeal elevation;Reduced airway/laryngeal closure;Reduced tongue base retraction;Penetration/Aspiration during swallow;Trace aspiration;Pharyngeal residue - valleculae;Compensatory strategies attempted (with notebox);Lateral channel residue  Pharyngeal --  Pharyngeal- Nectar Cup Delayed swallow initiation-pyriform sinuses;Reduced pharyngeal peristalsis;Reduced epiglottic inversion;Reduced anterior laryngeal mobility;Reduced laryngeal elevation;Reduced  airway/laryngeal closure;Reduced tongue base retraction;Penetration/Aspiration during swallow;Penetration/Apiration after swallow;Trace aspiration;Pharyngeal residue - valleculae;Compensatory strategies attempted (with notebox);Lateral channel residue  Pharyngeal --  Pharyngeal- Nectar Straw --  Pharyngeal --  Pharyngeal- Thin Teaspoon Delayed swallow initiation-pyriform sinuses;Reduced pharyngeal peristalsis;Reduced epiglottic inversion;Reduced anterior laryngeal mobility;Reduced laryngeal elevation;Reduced airway/laryngeal closure;Reduced tongue base retraction;Penetration/Aspiration before swallow;Penetration/Aspiration during swallow;Moderate aspiration;Pharyngeal residue - valleculae;Pharyngeal residue - pyriform  Pharyngeal --  Pharyngeal- Thin Cup --  Pharyngeal --  Pharyngeal- Thin Straw --  Pharyngeal --  Pharyngeal- Puree Delayed swallow initiation-vallecula;Reduced pharyngeal peristalsis;Reduced epiglottic inversion;Reduced anterior laryngeal mobility;Reduced laryngeal elevation;Pharyngeal residue - valleculae;Lateral channel residue;Compensatory strategies attempted (with notebox);Reduced tongue base retraction  Pharyngeal --  Pharyngeal- Mechanical Soft Delayed swallow  initiation-vallecula;Reduced pharyngeal peristalsis;Reduced epiglottic inversion;Reduced anterior laryngeal mobility;Reduced laryngeal elevation;Reduced tongue base retraction;Pharyngeal residue - valleculae;Lateral channel residue;Compensatory strategies attempted (with notebox)  Pharyngeal --  Pharyngeal- Regular --  Pharyngeal --  Pharyngeal- Multi-consistency --  Pharyngeal --  Pharyngeal- Pill --  Pharyngeal --  Pharyngeal Comment Pharyngeal swallow revealed delayed initiation moreso w/ liquids, especially thin liquids spilling to, and filling, the Pyriform Sinuses. This resulted in Aspiration before and during the swallow. Delayed congested coughing followed; aspirated material remained in viewable trachea along anterior wall. Also noted: reduced hyolaryngeal excursion and anterior movement w/ decreased pharyngeal peristalsis, reduced tongue base strength, reduced airway closure/protection, residual bolus material in the Vallecule, on BOT, and along lateral channels; inconsistent in the pyriform sinuses. Compensatory strategies attempted: Time allowed for f/u, dry swallowing b/t trials to reduce pharyngeal residue. Pt could not consistently follow instructions for swallowing strategies though he tried. Unsure of consistent carry-over of strategies w/out 100% Supervision and cueing.     Cervical Esophageal Phase  09/15/2021  Cervical Esophageal Phase Impaired  Pudding Teaspoon (No Data)  Pudding Cup --  Honey Teaspoon --  Honey Cup --  Nectar Teaspoon --  Nectar Cup (No Data)  Nectar Straw --  Thin Teaspoon --  Thin Cup --  Thin Straw --  Puree --  Mechanical Soft --  Regular --  Multi-consistency --  Pill --  Cervical Esophageal Comment bolus stasis in the cervical esophagus below the PE segment just above shoulders; slow clearing post swallow. Time given b/t trials.           Orinda Kenner, MS, CCC-SLP Speech Language Pathologist Rehab  Services [phone] [facility] 09/15/2021, 5:53 PM

## 2021-09-15 NOTE — TOC Progression Note (Signed)
Transition of Care [facility]) - Progression Note    Patient Details  Name: Alexander Lane MRN: [id number] Date of Birth: [birth year]  Transition of Care [facility]) CM/SW [place], RN Phone Number: 09/15/2021, 11:34 AM  Clinical Narrative:  Patient still has no bed offers at this time.  Wife requests we do not extend bed search to [facility], as she is not able to drive there, they have been married a long time and she wants to see him.  Patient had medical issues last evening, is getting continued medical workup at this time.  Palliative in to see patient today.  Awaiting medical workup results and continue to contact SNF for potential admission.  TOC to follow to discharge.     Expected Discharge Plan: [place] Barriers to Discharge: Continued Medical Work up  Expected Discharge Plan and Services Expected Discharge Plan: [place]   Discharge Planning Services: CM Consult Post Acute Care Choice: [place] Living arrangements for the past 2 months: Single Family Home                 DME Arranged: N/A DME Agency: NA       HH Arranged: NA HH Agency: NA         Social Determinants of Health (SDOH) Interventions    Readmission Risk Interventions Readmission Risk Prevention Plan 09/11/2021  Transportation Screening Complete  PCP or Specialist Appt within 3-5 Days Complete  HRI or [place] Complete  Social Work Consult for [place] Planning/Counseling Complete  Palliative Care Screening Not Applicable  Medication Review Press photographer) Complete  Some recent data might be hidden

## 2021-09-15 NOTE — Progress Notes (Signed)
OT Cancellation Note  Patient Details Name: Alexander Lane MRN: [id number] DOB: Jun 05, 1930   Cancelled Treatment:    Reason Eval/Treat Not Completed: Fatigue/lethargy limiting ability to participate. Upon attempt, pt back from MRI and MBSS. Pt endorsing fatigue and politely declines OT tx this afternoon. Agreeable to re-attempt next date.   Ardeth Perfect., MPH, MS, OTR/L ascom [phone] 09/15/21, 1:41 PM

## 2021-09-15 NOTE — Progress Notes (Signed)
Progress Note    Alexander Lane   MLY:[id number]  DOB: May 15, 1930  DOA: 09/08/2021     7 PCP: Rusty Aus, MD  Initial CC: weakness, falling, cough  Hospital Course: Alexander Lane is an 87 yo male with PMH  large brain meningioma, HTN, HLD, GERD, DVT and A. fib on Coumadin, CKD-3a, BPH, who presented with AMS, cough, SOB, right leg weakness and fall.   He was noted to be febrile on arrival to the ER, 101.4; he was also reported to have progressive worsening weakness at home per his wife and worsened mentation. He underwent work-up on admission.  CT head showed his known large left frontal extra-axial mass and appeared essentially similar in size to prior imaging with underlying mass-effect and edema involving left frontal lobe. CXR showed a mild airspace opacity involving the left hemidiaphragm new from prior imaging concerning for possible pneumonia. Blood cultures were obtained on admission and became positive in 3/4 bottles for strep pneumonia.  Interval History:  He continues to decline. Cannot tolerate PT/OT and swallow function continues to worsen. MRI brain today also shows worsening.  I'm concerned that his prognosis is starting to rapidly decline and he may better be served with hospice.   Assessment & Plan: * CAP (community acquired pneumonia) - Small airspace opacity noted along left hemidiaphragm on CXR.  Given strep pneumonia bacteremia, pulmonary source may be culprit - s/p Rocephin; sens reviewed; will de-escalate to amoxicillin on 11/25 to complete course  - Follow-up urinary strep pneumo antigen and Legionella -Trend procalcitonin (0.23>>2.22>>1.76)  Severe sepsis (HCC) - Febrile, tachycardia, tachypnea, leukocytosis, altered mentation; source presumed pulmonary along with seeding of blood -Repeat blood cultures on 09/10/2021; negative x 1 day - Continue abx - See pneumonia  Meningioma, cerebral ([place]) - mostly same size on CTH; no surgical intervention when  last seen by neurosurgery (wife decline surgical intervention) - given CTH findings, patient started back on keppra and decadron this admission; d/c decadron on 11/26, continue keppra - worsening RLE weakness on 11/28, worsening dysphagia, aspiration and overall functional decline - repeat MRI brain on 11/29 shows increase in midline shift from 11 mm to 18 mm and increased size of mass. He also clinically worsened after decadron was stopped on 11/26 - given the above, his prognosis is poor; he is not likely to benefit from rehab nor able to partake much in it, as evidenced by the past 48 hr decline. He would best be served with a hospice and comfort care route; discussed with palliative care who will continue more discussions with his wife, especially given his inability to continue eating much safely   Bacteremia due to Gram-positive bacteria - 3/4 bottles from 09/08/21 growing Strep pna; for now suspecting translocation from presumed PNA - repeat blood cultures ;on 11/24; negative x 1 day - s/p rocephin - d/c rocephin and start amoxicillin; goal 10 days total course (end date 09/17/21); due to dysphagia and aspiration risk from MBSS, will change to ampicillin for remainder of course  Physical deconditioning - Likely multifactorial from underlying meningioma and overall deconditioning from underlying bacteremia - PT/OT eval's: SNF recommended   Acute metabolic encephalopathy-resolved as of 09/11/2021 - patient symptoms include AMS, weakness - etiology considered due to metabolic derangements likely in setting of underlying infections  Acute renal failure superimposed on stage 3a chronic kidney disease ([place]) - patient has history of CKD3a. Baseline creat ~ 1 - 1.3.  - patient presents with increase in creat >0.3 mg/dL above baseline,  creat increase >1.5x baseline presumed to have occurred within past 7 days PTA - creatinine 1.7 on admission - s/p IVF - resume ARB and lasix on 11/27 - creat  back to baseline  PAF (paroxysmal atrial fibrillation) ([place]) - continue coumadin per pharmacy - lopressor PRN for rate control   Fall at home, initial encounter - see weakness  HLD (hyperlipidemia) - continue zocor  Hypertension - Resume ARB - Continue PRN hydralazine   BPH (benign prostatic hyperplasia) - Continue terazosin   Old records reviewed in assessment of this patient  Antimicrobials: Azithromycin 09/08/21  x 1 Rocephin 11/22 >> 11/25 Amoxicillin 11/25 >> 11/29 Ampicillin 11/29 to 12/1  DVT prophylaxis: Coumadin  Code Status:   Code Status: DNR  Disposition Plan: Stable for discharge when bed available Status is: Inpatient  Objective: Blood pressure 126/72, pulse 79, temperature 98.6 F (37 C), resp. rate 17, height 5\' 8"  (1.727 m), weight 86.1 kg, SpO2 98 %.  Examination:  Physical Exam Constitutional:      Comments: Weak, deconditioned appearing, but NAD  HENT:     Head: Normocephalic and atraumatic.     Mouth/Throat:     Mouth: Mucous membranes are moist.  Eyes:     Extraocular Movements: Extraocular movements intact.  Cardiovascular:     Rate and Rhythm: Normal rate and regular rhythm.  Pulmonary:     Effort: Pulmonary effort is normal. No respiratory distress.     Breath sounds: Rhonchi present.  Abdominal:     General: Bowel sounds are normal. There is no distension.     Palpations: Abdomen is soft.     Tenderness: There is no abdominal tenderness.  Musculoskeletal:        General: Normal range of motion.     Cervical back: Normal range of motion and neck supple. No rigidity or tenderness.  Skin:    General: Skin is warm and dry.  Neurological:     General: No focal deficit present.  Psychiatric:        Mood and Affect: Mood normal.        Behavior: Behavior normal.     Consultants:    Procedures:    Data Reviewed: I have personally reviewed labs and imaging studies     LOS: 7 days  Time spent: Greater than 50% of the 35  minute visit was spent in counseling/coordination of care for the patient as laid out in the A&P.   Dwyane Dee, MD [facility] 09/15/2021, 4:40 PM

## 2021-09-15 NOTE — Progress Notes (Signed)
Palliative: [name] is sitting up in the bed in his room.  He greets me, making and somewhat keeping eye contact.  He appears acutely/chronically ill and frail.  He is alert, oriented to person and place, but not month.  I believe that he can make his basic needs known.  There is no family at bedside at this time.  Transition care team was present to assist with disposition.  During our visit, [name] is noted to have a bite of breakfast in his mouth that he continues to roll around and chew.  He sounds "junky", but does have a productive cough.  He is to have MB BS sometime today.  He is on nectar thick liquids.    Call to wife, Alexander Lane.  Alexander Lane shares good health until he had a bout of COVID in May.  She shares that she spent 10 days in the hospital and 2 weeks at PT and rehab.  She shares that since that time he lost his sense of smell and has had bowel incontinence.  She shares that he was able to manage his own physical needs except for the bowel incontinence until this hospitalization.  She shares that he has been experiencing some confusion and memory loss.    We talk in detail about his swallowing issues.  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  She shares that Dr. Did stretching at that time, but he was to have more at a later date.  She shares that she canceled this appointment twice because she felt that [name] was not able to have further stretching.    We talked about a few "what if's and maybe's".  Alexander Lane would like for her husband to go to short-term rehab.  We talked about her ability to care for him, especially if he does not regain functional status.  She shares that he "would probably die soon" if he were to be forced to live in long-term care. She tells me that daughter shares that they would pay for people to come into the home to help.    We talked about palliative medicine, NP visit once a month to continue talking  about goals of care.  We are discussing CODE STATUS prior to being disconnecting.    Return phone call to Alexander Lane.  She agrees to out patient palliative services. Provider choice offered, [facility] for outpatient palliative services.  We also talked about CODE STATUS.  Alexander Lane shares that they have both had living wills.  She shares that he would want to treat the treatable but no CPR or intubation.  CODE STATUS updated.  We talked about next steps of MBBS and MRI brain.  Conference with attending, bedside nursing staff and [facility] team related to patient condition, needs, [place] and disposition.     Plan:  Continue to treat the treatable, but no CPR or intubation. STR when bed is offered, the goal of ultimately returning home.  Outpatient palliative services with [facility].   37 minutes  Alexander Axe, NP Palliative medicine team  Team phone [phone] Greater than 50% of this time was spent counseling and coordinating care related to the above assessment and plan.

## 2021-09-15 NOTE — Consult Note (Signed)
Alexander Lane for warfarin  Indication: atrial fibrillation and DVT  Allergies  Allergen Reactions   Celecoxib Rash    Patient Measurements: Height: 5\' 8"  (172.7 cm) Weight: 86.1 kg (189 lb 13.1 oz) IBW/kg (Calculated) : 68.4  Vital Signs: Temp: 98 F (36.7 C) (11/29 0748) Temp Source: Oral (11/29 0109) BP: 118/61 (11/29 0748) Pulse Rate: 70 (11/29 0748)  Labs: Recent Labs    09/13/21 0427 09/14/21 0431 09/15/21 0608  HGB 11.0*  --   --   HCT 32.9*  --   --   PLT 251  --   --   LABPROT 24.3* 24.1* 24.0*  INR 2.2* 2.2* 2.1*  CREATININE 1.05  --   --     Estimated Creatinine Clearance (by C-G formula based on SCr of 1.05 mg/dL) Male: 41.7 mL/min Male: 50.9 mL/min  Medical History: Past Medical History:  Diagnosis Date   BPH (benign prostatic hyperplasia)    Chronic kidney disease    DVT (deep venous thrombosis) ([place]) 2014   right leg, IVC filter   Dysrhythmia 2019   Paroxysmal atrial fibrillation   Hypertension     Medications:  Scheduled:   amoxicillin  1,000 mg Oral Q8H   furosemide  20 mg Oral Daily   irbesartan  150 mg Oral Daily   levETIRAcetam  250 mg Oral BID   pantoprazole  40 mg Oral BID   simvastatin  20 mg Oral q1800   terazosin  5 mg Oral QHS   warfarin  5 mg Oral q1600   Warfarin - Pharmacist Dosing Inpatient   Does not apply q1600    Assessment: 87yo male with PMH of BPH, CKD, DVT, PAF, and HTN who was admitted to the hospital for cough and SOB. Patient is on warfarin for hx of DVT and PAF. Pharmacy was consulted for warfarin management.   PTA warfarin regimen: 5mg  daily (35mg  weekly)  Drug interactions: ceftriaxone switched to amoxicillin (new DDI with warfarin)  Date  INR Interpretation/dose 11/22  1.6 Subthera, 5 mg 11/23  1.7 Subthera, 5 mg 11/24  1.7 Subthera, 6 mg  11/25  1.9 Subthera, 5 mg 11/26 1.9  Subthera, 6 mg  11/27 2.2 Therapeutic, 5 mg  11/28   2.2 Therapeutic, 5mg   daily 11/29 2.1 Therapeutic, 5mg   Goal of Therapy:  INR 2-3 Monitor platelets by anticoagulation protocol: Yes   Plan:  INR remains therapeutic Continue warfarin 5mg  daily Continue daily INR and CBC at least every 3 days  Thurmon Mizell Rodriguez-Guzman PharmD, BCPS 09/15/2021 7:50 AM

## 2021-09-15 NOTE — Progress Notes (Signed)
[place] [facility] Liaison Note  Notified by [facility] of patient/family request of [facility] Paliative services.  [facility] hospital liaison will follow patient for discharge disposition.   Please call with any questions/concerns.    Thank you for the opportunity to participate in this patient's care.   Daphene Calamity, MSW [facility] Liaison  [phone]

## 2021-09-16 DIAGNOSIS — D320 Benign neoplasm of cerebral meninges: Secondary | ICD-10-CM

## 2021-09-16 DIAGNOSIS — G9341 Metabolic encephalopathy: Secondary | ICD-10-CM | POA: Diagnosis not present

## 2021-09-16 DIAGNOSIS — R7881 Bacteremia: Secondary | ICD-10-CM | POA: Diagnosis not present

## 2021-09-16 DIAGNOSIS — J189 Pneumonia, unspecified organism: Secondary | ICD-10-CM | POA: Diagnosis not present

## 2021-09-16 LAB — PROTIME-INR
INR: 2.3 — ABNORMAL HIGH (ref 0.8–1.2)
Prothrombin Time: 25.5 seconds — ABNORMAL HIGH (ref 11.4–15.2)

## 2021-09-16 NOTE — Progress Notes (Addendum)
PROGRESS NOTE    Alexander Lane  ZYY:[id number] DOB: 01-12-1930 DOA: 09/08/2021 PCP: Rusty Aus, MD    Brief Narrative:   87 yo male with PMH  large brain meningioma, HTN, HLD, GERD, DVT and A. fib on Coumadin, CKD-3a, BPH, who presented with AMS, cough, SOB, right leg weakness and fall.   He was noted to be febrile on arrival to the ER, 101.4; he was also reported to have progressive worsening weakness at home per his wife and worsened mentation. He underwent work-up on admission.  CT head showed his known large left frontal extra-axial mass and appeared essentially similar in size to prior imaging with underlying mass-effect and edema involving left frontal lobe. CXR showed a mild airspace opacity involving the left hemidiaphragm new from prior imaging concerning for possible pneumonia. Blood cultures were obtained on admission and became positive in 3/4 bottles for strep pneumonia.   Assessment & Plan:   Principal Problem:   CAP (community acquired pneumonia) Active Problems:   BPH (benign prostatic hyperplasia)   Acute renal failure superimposed on stage 3a chronic kidney disease (HCC)   Hypertension   PAF (paroxysmal atrial fibrillation) (HCC)   Meningioma, cerebral (HCC)   Fall at home, initial encounter   HLD (hyperlipidemia)   Severe sepsis ([place])   Physical deconditioning   Bacteremia due to Gram-positive bacteria   * CAP (community acquired pneumonia) - Small airspace opacity noted along left hemidiaphragm on CXR.  Given strep pneumonia bacteremia, pulmonary source may be culprit -Tolerating antibiotics Plan: Complete empiric course of antibiotics.  Last dose of ampicillin 11/30    Severe sepsis (HCC) - Febrile, tachycardia, tachypnea, leukocytosis, altered mentation; source presumed pulmonary along with seeding of blood -Repeat blood cultures on 09/10/2021; no growth to date Plan: Sepsis physiology resolving.  We will complete course of antibiotics as  above.  Last dose 11/30    Meningioma, cerebral ([place]) - mostly same size on CTH; no surgical intervention when last seen by neurosurgery (wife decline surgical intervention) - given CTH findings, patient started back on keppra and decadron this admission; d/c decadron on 11/26, continue keppra - worsening RLE weakness on 11/28, worsening dysphagia, aspiration and overall functional decline - repeat MRI brain on 11/29 shows increase in midline shift from 11 mm to 18 mm and increased size of mass. He also clinically worsened after decadron was stopped on 11/26 - given the above, his prognosis is poor; wife would like to see how he responds and attempt to get him into short-term rehab with ultimate plan to get back home.  I explained that we may or may not be able to find a rehab facility.  She expressed understanding.  We will continue to discussed with patient and wife as well as TOC for disposition planning.   Bacteremia due to Gram-positive bacteria - 3/4 bottles from 09/08/21 growing Strep pna; for now suspecting translocation from presumed PNA - repeat blood cultures ;on 11/24; negative x 1 day - s/p rocephin empirically Plan: Continue ampicillin.  Last dose 11/30.    Physical deconditioning - Likely multifactorial from underlying meningioma and overall deconditioning from underlying bacteremia - PT/OT eval's: SNF recommended.  Patient may be a more appropriate for de-escalation of care and transition to a comfort approach   Acute metabolic encephalopathy-resolved as of 09/11/2021 - patient symptoms include AMS, weakness - etiology considered due to metabolic derangements likely in setting of underlying infections   Acute renal failure superimposed on stage 3a chronic kidney disease ([place]) -  patient has history of CKD3a. Baseline creat ~ 1 - 1.3.  - patient presents with increase in creat >0.3 mg/dL above baseline, creat increase >1.5x baseline presumed to have occurred within past 7  days PTA - creatinine 1.7 on admission - s/p IVF - resume ARB and lasix on 11/27 - creat back to baseline   PAF (paroxysmal atrial fibrillation) ([place]) - continue coumadin per pharmacy - lopressor PRN for rate control    Fall at home, initial encounter - see weakness   HLD (hyperlipidemia) - continue zocor   Hypertension - Resume ARB - Continue PRN hydralazine    BPH (benign prostatic hyperplasia) - Continue terazosin  DVT prophylaxis: Warfarin Code Status: DNR Family Communication: Disposition Plan: Status is: Inpatient  Remains inpatient appropriate because: Severe sepsis, metabolic encephalopathy, worsening brain lesion       Level of care: Telemetry Medical  Consultants:  Palliative care  Procedures:  None  Antimicrobials: Ampicillin   Subjective: Patient seen and examined.  Sitting up in chair.  Visibly no distress.  Unable to provide too much history  Objective: Vitals:   09/15/21 2033 09/16/21 0543 09/16/21 0920 09/16/21 1202  BP: (!) 132/55 (!) 125/57 (!) 118/50 (!) 130/53  Pulse: 75 73 76 71  Resp: 16 16 19 19   Temp: 99 F (37.2 C) 98.1 F (36.7 C) 98.3 F (36.8 C) 98.1 F (36.7 C)  TempSrc: Oral Oral Oral Oral  SpO2: 95% 98% 97% 97%  Weight:      Height:        Intake/Output Summary (Last 24 hours) at 09/16/2021 1425 Last data filed at 09/16/2021 0553 Gross per 24 hour  Intake 548.42 ml  Output 1600 ml  Net -1051.58 ml   Filed Weights   09/08/21 1337  Weight: 86.1 kg    Examination:  General exam: No acute distress Respiratory system: Poor respiratory effort.  Lungs clear.  Normal work of breathing.  Room air Cardiovascular system: S1-S2, RRR, no murmurs, no pedal edema Gastrointestinal system: Soft, NT/ND, normal bowel sounds Central nervous system: Alert, oriented to person only, no focal deficits Extremities: Markedly decreased there are Skin: No rashes, lesions or ulcers Psychiatry: Judgement and insight appear  impaired. Mood & affect confused.     Data Reviewed: I have personally reviewed following labs and imaging studies  CBC: Recent Labs  Lab 09/10/21 0534 09/11/21 0508 09/12/21 0404 09/13/21 0427  WBC 15.2* 12.6* 9.8 9.8  NEUTROABS 13.5* 10.8* 7.9* 6.5  HGB 10.5* 10.3* 10.8* 11.0*  HCT 31.6* 31.3* 32.8* 32.9*  MCV 89.5 88.7 88.4 87.3  PLT 222 237 264 315   Basic Metabolic Panel: Recent Labs  Lab 09/10/21 0534 09/11/21 0508 09/12/21 0404 09/13/21 0427  NA 139 138 138 137  K 4.1 4.1 4.2 4.3  CL 106 107 105 105  CO2 29 27 28 29   GLUCOSE 124* 125* 114* 96  BUN 32* 39* 35* 34*  CREATININE 1.37* 1.28* 1.05 1.05  CALCIUM 8.8* 8.5* 8.8* 8.6*  MG 2.0 2.1 2.4 2.3   GFR: Estimated Creatinine Clearance (by C-G formula based on SCr of 1.05 mg/dL) Male: 41.7 mL/min Male: 50.9 mL/min Liver Function Tests: No results for input(s): AST, ALT, ALKPHOS, BILITOT, PROT, ALBUMIN in the last 168 hours. No results for input(s): LIPASE, AMYLASE in the last 168 hours. No results for input(s): AMMONIA in the last 168 hours. Coagulation Profile: Recent Labs  Lab 09/12/21 0404 09/13/21 0427 09/14/21 0431 09/15/21 0608 09/16/21 0430  INR 1.9* 2.2* 2.2* 2.1*  2.3*   Cardiac Enzymes: No results for input(s): CKTOTAL, CKMB, CKMBINDEX, TROPONINI in the last 168 hours. BNP (last 3 results) No results for input(s): PROBNP in the last 8760 hours. HbA1C: No results for input(s): HGBA1C in the last 72 hours. CBG: No results for input(s): GLUCAP in the last 168 hours. Lipid Profile: No results for input(s): CHOL, HDL, LDLCALC, TRIG, CHOLHDL, LDLDIRECT in the last 72 hours. Thyroid Function Tests: No results for input(s): TSH, T4TOTAL, FREET4, T3FREE, THYROIDAB in the last 72 hours. Anemia Panel: No results for input(s): VITAMINB12, FOLATE, FERRITIN, TIBC, IRON, RETICCTPCT in the last 72 hours. Sepsis Labs: Recent Labs  Lab 09/10/21 0534  PROCALCITON 1.76    Recent Results (from the  past 240 hour(s))  Blood Culture (routine x 2)     Status: Abnormal   Collection Time: 09/08/21  1:35 PM   Specimen: BLOOD  Result Value Ref Range Status   Specimen Description   Final    BLOOD LEFT ANTECUBITAL Performed at [facility], [address] [place], [place] [address]    Special Requests   Final    BOTTLES DRAWN AEROBIC AND ANAEROBIC Blood Culture results may not be optimal due to an excessive volume of blood received in culture bottles Performed at [facility], [address], [place], [place] [address]    Culture  Setup Time   Final    GRAM POSITIVE COCCI AEROBIC BOTTLE ONLY CRITICAL VALUE NOTED.  VALUE IS CONSISTENT WITH PREVIOUSLY REPORTED AND CALLED VALUE. Performed at [facility], [address] [place], [place] [address]    Culture (A)  Final    STREPTOCOCCUS PNEUMONIAE SUSCEPTIBILITIES PERFORMED ON PREVIOUS CULTURE WITHIN THE LAST 5 DAYS. Performed at [facility], [place] [address] [place], [place] [address]    Report Status 09/11/2021 FINAL  Final  Blood Culture (routine x 2)     Status: Abnormal   Collection Time: 09/08/21  1:35 PM   Specimen: BLOOD  Result Value Ref Range Status   Specimen Description   Final    BLOOD RIGHT ANTECUBITAL Performed at [facility], [address], [place], [place] [address]    Special Requests   Final    BOTTLES DRAWN AEROBIC AND ANAEROBIC Blood Culture adequate volume Performed at [facility], [address] [place], [place] [address]    Culture  Setup Time   Final    GRAM POSITIVE COCCI IN BOTH AEROBIC AND ANAEROBIC BOTTLES Organism ID to follow CRITICAL RESULT CALLED TO, READ BACK BY AND VERIFIED WITHVioleta Gelinas [facility] [id number] 09/09/21 HNM Performed at [facility], [address] [place], [place] [address]    Culture STREPTOCOCCUS PNEUMONIAE (A)  Final   Report Status 09/11/2021 FINAL  Final   Organism ID, Bacteria STREPTOCOCCUS PNEUMONIAE   Final      Susceptibility   Streptococcus pneumoniae - MIC*    ERYTHROMYCIN >=8 RESISTANT Resistant     LEVOFLOXACIN 1 SENSITIVE Sensitive     VANCOMYCIN 0.5 SENSITIVE Sensitive     PENICILLIN (meningitis) <=0.06 SENSITIVE Sensitive     PENO - penicillin <=0.06      PENICILLIN (non-meningitis) <=0.06 SENSITIVE Sensitive     PENICILLIN (oral) <=0.06 SENSITIVE Sensitive     CEFTRIAXONE (non-meningitis) <=0.12 SENSITIVE Sensitive     CEFTRIAXONE (meningitis) <=0.12 SENSITIVE Sensitive     * STREPTOCOCCUS PNEUMONIAE  Urine Culture     Status: Abnormal   Collection Time: 09/08/21  1:35 PM   Specimen:  In/Out Cath Urine  Result Value Ref Range Status   Specimen Description   Final    IN/OUT CATH URINE Performed at [facility], [address] [place], [place] [address]    Special Requests   Final    NONE Performed at [facility], [address] [place], [place] [address]    Culture MULTIPLE SPECIES PRESENT, SUGGEST RECOLLECTION (A)  Final   Report Status 09/10/2021 FINAL  Final  Resp Panel by RT-PCR (Flu A&B, Covid) Nasopharyngeal Swab     Status: None   Collection Time: 09/08/21  1:35 PM   Specimen: Nasopharyngeal Swab; Nasopharyngeal(NP) swabs in vial transport medium  Result Value Ref Range Status   SARS Coronavirus 2 by RT PCR NEGATIVE NEGATIVE Final    Comment: (NOTE) SARS-CoV-2 target nucleic acids are NOT DETECTED.  The SARS-CoV-2 RNA is generally detectable in upper respiratory specimens during the acute phase of infection. The lowest concentration of SARS-CoV-2 viral copies this assay can detect is 138 copies/mL. A negative result does not preclude SARS-Cov-2 infection and should not be used as the sole basis for treatment or other patient management decisions. A negative result may occur with  improper specimen collection/handling, submission of specimen other than nasopharyngeal swab, presence of viral mutation(s) within the areas targeted by this  assay, and inadequate number of viral copies(<138 copies/mL). A negative result must be combined with clinical observations, patient history, and epidemiological information. The expected result is Negative.  Fact Sheet for Patients:  EntrepreneurPulse.com.au  Fact Sheet for Healthcare Providers:  IncredibleEmployment.be  This test is no t yet approved or cleared by the [place] FDA and  has been authorized for detection and/or diagnosis of SARS-CoV-2 by FDA under an Emergency Use Authorization (EUA). This EUA will remain  in effect (meaning this test can be used) for the duration of the COVID-19 declaration under Section 564(b)(1) of the Act, 21 U.S.C.section 360bbb-3(b)(1), unless the authorization is terminated  or revoked sooner.       Influenza A by PCR NEGATIVE NEGATIVE Final   Influenza B by PCR NEGATIVE NEGATIVE Final    Comment: (NOTE) The Xpert Xpress SARS-CoV-2/FLU/RSV plus assay is intended as an aid in the diagnosis of influenza from Nasopharyngeal swab specimens and should not be used as a sole basis for treatment. Nasal washings and aspirates are unacceptable for Xpert Xpress SARS-CoV-2/FLU/RSV testing.  Fact Sheet for Patients: EntrepreneurPulse.com.au  Fact Sheet for Healthcare Providers: IncredibleEmployment.be  This test is not yet approved or cleared by the [place] FDA and has been authorized for detection and/or diagnosis of SARS-CoV-2 by FDA under an Emergency Use Authorization (EUA). This EUA will remain in effect (meaning this test can be used) for the duration of the COVID-19 declaration under Section 564(b)(1) of the Act, 21 U.S.C. section 360bbb-3(b)(1), unless the authorization is terminated or revoked.  Performed at [facility], [address] [place], [place] [address]   Blood Culture ID Panel (Reflexed)     Status: Abnormal   Collection Time:  09/08/21  1:35 PM  Result Value Ref Range Status   Enterococcus faecalis NOT DETECTED NOT DETECTED Final   Enterococcus Faecium NOT DETECTED NOT DETECTED Final   Listeria monocytogenes NOT DETECTED NOT DETECTED Final   Staphylococcus species NOT DETECTED NOT DETECTED Final   Staphylococcus aureus (BCID) NOT DETECTED NOT DETECTED Final   Staphylococcus epidermidis NOT DETECTED NOT DETECTED Final   Staphylococcus lugdunensis NOT DETECTED NOT DETECTED Final   Streptococcus species DETECTED (A)  NOT DETECTED Final    Comment: CRITICAL RESULT CALLED TO, READ BACK BY AND VERIFIED WITH: Violeta Gelinas PHARMD [id number] 09/09/21 HNM    Streptococcus agalactiae NOT DETECTED NOT DETECTED Final   Streptococcus pneumoniae DETECTED (A) NOT DETECTED Final    Comment: CRITICAL RESULT CALLED TO, READ BACK BY AND VERIFIED WITH: Violeta Gelinas PHARMD [id number] 09/09/21 HNM    Streptococcus pyogenes NOT DETECTED NOT DETECTED Final   A.calcoaceticus-baumannii NOT DETECTED NOT DETECTED Final   Bacteroides fragilis NOT DETECTED NOT DETECTED Final   Enterobacterales NOT DETECTED NOT DETECTED Final   Enterobacter cloacae complex NOT DETECTED NOT DETECTED Final   Escherichia coli NOT DETECTED NOT DETECTED Final   Klebsiella aerogenes NOT DETECTED NOT DETECTED Final   Klebsiella oxytoca NOT DETECTED NOT DETECTED Final   Klebsiella pneumoniae NOT DETECTED NOT DETECTED Final   Proteus species NOT DETECTED NOT DETECTED Final   Salmonella species NOT DETECTED NOT DETECTED Final   Serratia marcescens NOT DETECTED NOT DETECTED Final   Haemophilus influenzae NOT DETECTED NOT DETECTED Final   Neisseria meningitidis NOT DETECTED NOT DETECTED Final   Pseudomonas aeruginosa NOT DETECTED NOT DETECTED Final   Stenotrophomonas maltophilia NOT DETECTED NOT DETECTED Final   Candida albicans NOT DETECTED NOT DETECTED Final   Candida auris NOT DETECTED NOT DETECTED Final   Candida glabrata NOT DETECTED NOT DETECTED Final   Candida  krusei NOT DETECTED NOT DETECTED Final   Candida parapsilosis NOT DETECTED NOT DETECTED Final   Candida tropicalis NOT DETECTED NOT DETECTED Final   Cryptococcus neoformans/gattii NOT DETECTED NOT DETECTED Final    Comment: Performed at [facility], [address] [place], [place] [address]  CULTURE, BLOOD (ROUTINE X 2) w Reflex to ID Panel     Status: None   Collection Time: 09/10/21  5:34 AM   Specimen: BLOOD  Result Value Ref Range Status   Specimen Description BLOOD LHAND  Final   Special Requests   Final    BOTTLES DRAWN AEROBIC AND ANAEROBIC Blood Culture adequate volume   Culture   Final    NO GROWTH 5 DAYS Performed at [facility], [address] [place], [place] [address]    Report Status 09/15/2021 FINAL  Final  CULTURE, BLOOD (ROUTINE X 2) w Reflex to ID Panel     Status: None   Collection Time: 09/10/21  5:38 AM   Specimen: BLOOD  Result Value Ref Range Status   Specimen Description BLOOD RTHAND  Final   Special Requests   Final    BOTTLES DRAWN AEROBIC AND ANAEROBIC Blood Culture adequate volume   Culture   Final    NO GROWTH 5 DAYS Performed at [facility], [address] [place], [place] [address]    Report Status 09/15/2021 FINAL  Final  SARS CORONAVIRUS 2 (TAT 6-24 HRS) Nasopharyngeal Nasopharyngeal Swab     Status: None   Collection Time: 09/13/21  3:42 PM   Specimen: Nasopharyngeal Swab  Result Value Ref Range Status   SARS Coronavirus 2 NEGATIVE NEGATIVE Final    Comment: (NOTE) SARS-CoV-2 target nucleic acids are NOT DETECTED.  The SARS-CoV-2 RNA is generally detectable in upper and lower respiratory specimens during the acute phase of infection. Negative results do not preclude SARS-CoV-2 infection, do not rule out co-infections with other pathogens, and should not be used as the sole basis for treatment or other patient management decisions. Negative results must be combined with clinical observations, patient  history, and epidemiological information. The expected result  is Negative.  Fact Sheet for Patients: SugarRoll.be  Fact Sheet for Healthcare Providers: https://www.woods-mathews.com/  This test is not yet approved or cleared by the [place] FDA and  has been authorized for detection and/or diagnosis of SARS-CoV-2 by FDA under an Emergency Use Authorization (EUA). This EUA will remain  in effect (meaning this test can be used) for the duration of the COVID-19 declaration under Se ction 564(b)(1) of the Act, 21 U.S.C. section 360bbb-3(b)(1), unless the authorization is terminated or revoked sooner.  Performed at [facility], [place] [address], [place], [place] [address]          Radiology Studies: MR BRAIN W WO CONTRAST  Result Date: 09/15/2021 CLINICAL DATA:  Brain mass. EXAM: MRI HEAD WITHOUT AND WITH CONTRAST TECHNIQUE: Multiplanar, multiecho pulse sequences of the brain and surrounding structures were obtained without and with intravenous contrast. CONTRAST:  7.61mL GADAVIST GADOBUTROL 1 MMOL/ML IV SOLN COMPARISON:  Head CT 09/08/2021 and MRI 03/08/2021 FINDINGS: Brain: An extra-axial mass anteriorly over the left frontal convexity has mildly enlarged from the prior MRI, measuring 5.5 x 6.0 x 5.4 cm. The mass again demonstrates avidly enhancing solid components as well as prominent cystic components. Extensive edema in the left frontal lobe extending into the genu of the corpus callosum is stable to minimally increased compared to the prior MRI, and prominent mass effect is again noted on the left frontal lobe and ventricles with 1.8 cm of rightward midline shift which has slightly increased from the prior MRI upon remeasurement. No acute infarct, intracranial hemorrhage, or extra-axial fluid collection is identified. Patchy T2 hyperintensities elsewhere in the cerebral white matter are similar to the prior MRI and are nonspecific but  compatible with moderate chronic small vessel ischemic disease. There is mild cerebral atrophy. Vascular: Major intracranial vascular flow voids are preserved. Skull and upper cervical spine: Unremarkable bone marrow signal. Chronic nonunited dens fracture. Sinuses/Orbits: Unremarkable orbits. Clear paranasal sinuses. Small left mastoid effusion. Other: None. IMPRESSION: 1. Mildly increased size of left frontal extra-axial mass compatible with meningioma since 03/08/2021. Extensive left frontal lobe edema with mildly increased rightward midline shift, now 1.8 cm. 2. Moderate chronic small vessel ischemic disease. 3. No acute infarct. Electronically Signed   By: Logan Bores M.D.   On: 09/15/2021 14:25        Scheduled Meds:  furosemide  20 mg Oral Daily   irbesartan  150 mg Oral Daily   pantoprazole  40 mg Oral BID   simvastatin  20 mg Oral q1800   terazosin  5 mg Oral QHS   warfarin  5 mg Oral q1600   Warfarin - Pharmacist Dosing Inpatient   Does not apply q1600   Continuous Infusions:  ampicillin (OMNIPEN) IV 2 g (09/16/21 1422)   levETIRAcetam 250 mg (09/16/21 0816)     LOS: 8 days    Time spent: 35 minutes    Sidney Ace, MD [facility]   If 7PM-7AM, please contact night-coverage  09/16/2021, 2:25 PM

## 2021-09-16 NOTE — Progress Notes (Addendum)
Speech Language Pathology Treatment: Dysphagia  Patient Details Name: Alexander Lane MRN: [id number] DOB: 1929/10/28 Today's Date: 09/16/2021 Time: 5188-4166 SLP Time Calculation (min) (ACUTE ONLY): 40 min  Assessment / Plan / Recommendation Clinical Impression  Pt seen today for ongoing assessment of toleration of diet; education re: his swallowing s/p the MBSS yesterday as well as the need for a dysphagia diet at this time. Pt was awake and sitting in chair post PT session. He quickly requested this Clinician to assist him back to bed. Also noted pt was sitting in a dark room w/ the blinds closed -- he did Not want the blinds opened when this Clinician suggested it in order to support orientation of day/night. Pt was confused about this stating it was "10pm at night" when asked. Unsure if any Baseline Cognitive decline w/ the Baseline of Large brain hemangioma per chart (Head CT: "CT head showed his known large left frontal extra-axial mass and appeared essentially similar in size to prior imaging with underlying mass-effect and edema involving left frontal lobe.".)  Noted pt has taken bites/sips of his breakfast meal. Continued w/ few more trials as pt would accept; but he only accepted 3-4 trials each of puree/minced foods and Honey liquids. Pt required min-mod tactile/verbal/visual cues during bolus intake, encouragement w/ po intake and follow through w/ using swallowing strategies of DRY swallows and Throat clearing post trials. He fed self w/ setup support endorsing he wanted to drink from a Cup -- he nodded when this was recommended. Pt consumed trials w/ no immediate, overt clinical s/s of aspiration noted; no immediate coughing, no decline in respiratory presentation noted during/post trials. Toward end of last bolus of pudding, noted pt cleared his throat x2 -- stated he felt unsure "if it was going down" and endorsed he felt it in his throat briefly. Suspect impact of Esophageal phase  dysmotility as well as pharyngeal residue. Pt was instructed on using the DRY swallowing w/ EACH bite or sip to aid clearing. He exhibited min decreased attention to task and follow through w/ the swallowing strategies when not verbally cued.  Oral phase was adequate but c/b intermittent, deliberate bolus management and oral clearing of increased textured boluses. Time and alternating boluses appeared beneficial for overall swallowing and clearing. Unsure if related to oropharyngeal phase dysphagia, overall weakness, or min decreased attention/awareness.    Pt presents w/ oropharyngeal phase dysphagia w/ Esophageal phase dysmotility w/ increased risk for aspiration during and after the swallow from pharyngeal phase residue as well as potential retrograde bolus activity. Pt is at increased risk for aspiration pneumonia, pulmonary decline as well as reduced oral intake to adequately meet nutrition/hydration needs. F/u at SNF for Dysphagia tx and ed is recommended.   Recommend continue a dysphagia level 2 diet(minced meats/foods) w/ Honey consistency liquids; strict aspiration precautions and swallowing strategies of DRY swallowing, effortful swallowing, alternating foods/liquids, throat clear during/post meals/intake. Pills Crushed in puree for safety. Support tray setup; encourage oral intake and Supervision at meals, reduce Distractions during meals. Repeat MBSS in ~4 weeks as pt's medical status improves in order to safely upgrade oral diet, hopefully. Recommend f/u w/ Palliative Care for Alexander Lane, ed and w/ Dietician for support. NSG/MD updated      HPI HPI: Per 87 H&P "Alexander Lane is a 87 y.o. adult with medical history significant of large brain hemangioma, HTN, HLD, GERD, DVT and A. fib on Coumadin, CKD-3a, BPH, who presents with AMS, cough, SOB, right leg weakness and fall.  Patient has altered mental status, cannot provide accurate medical history.  Per his wife (I called his wife by  phone), pt has cough and shortness of breath, does not seem to have chest pain.  He has fever with temperature of 101.4 in ED.  No active nausea, vomiting, diarrhea or abdominal pain.  No symptoms of UTI.  Initially patient was not confused, but later on pt developed confusion. He looks very weak.  Per his wife, patient normally is orientated x3, can use walker to walk.  This morning patient developed right leg weakness, cannot move right leg normally.  No facial droop or slurred speech. Per his wife, pt slided out the bed, but not have significant fall or injury." CXR 09/08/21 "1. Mild airspace opacity along the left hemidiaphragm is new from  03/13/2021, and nonspecific for atelectasis versus or early  pneumonia.  2. Small hiatal hernia.  3.  Aortic Atherosclerosis (ICD10-I70.0).  4. Thoracic spondylosis.".   Head CT 09/08/21 "1. The large left frontal extra-axial mass appears similar versus mildly increased in size; however, evaluation is limited on this  noncontrast head CT. Similar extensive mass effect and edema in the  left frontal lobe with approximately 11 mm of subfalcine herniation.  An MRI with contrast could provide more sensitive evaluation for  progression if clinically indicated.  2. No evidence of superimposed/interval acute abnormality.".   MRI on 09/15/2021 revealed: "Mildly increased size of left frontal extra-axial mass compatible  with meningioma since 03/08/2021. Extensive left frontal lobe edema  with mildly increased rightward midline shift, now 1.8 cm.  2. Moderate chronic small vessel ischemic disease.  3. No acute infarct.".  MBSS completed 09/15/21 - see results.      SLP Plan  Continue with current plan of care (pt to f/u at [facility])      Recommendations for follow up therapy are one component of a multi-disciplinary discharge planning process, led by the attending physician.  Recommendations may be updated based on patient status, additional functional criteria and insurance  authorization.    Recommendations  Diet recommendations: Dysphagia 2 (fine chop);Honey-thick liquid Liquids provided via: Cup Medication Administration: Crushed with puree (for ease of clearing) Supervision: Patient able to self feed;Intermittent supervision to cue for compensatory strategies Compensations: Minimize environmental distractions;Slow rate;Small sips/bites;Lingual sweep for clearance of pocketing;Multiple dry swallows after each bite/sip;Follow solids with liquid;Clear throat intermittently Postural Changes and/or Swallow Maneuvers: Out of bed for meals;Seated upright 90 degrees;Upright 30-60 min after meal                General recommendations:  (Palliative Care f/u for ed/GOC; Dietician f/u) Oral Care Recommendations: Oral care BID;Staff/trained caregiver to provide oral care ([employer]) Follow Up Recommendations: Skilled nursing-short term rehab (<3 hours/day) Assistance recommended at discharge: Frequent or constant Supervision/Assistance SLP Visit Diagnosis: Dysphagia, oropharyngeal phase (R13.12) (unsure of Cognitive status(?)) Plan: Continue with current plan of care (pt to f/u at SNF)       [place], [place], [place] Pathologist Rehab Services [phone] [facility]  09/16/2021, 3:07 PM

## 2021-09-16 NOTE — Progress Notes (Signed)
Physical Therapy Treatment Patient Details Name: Alexander Lane MRN: [id number] DOB: [birth year] Today's Date: 09/16/2021   History of Present Illness Pt is a 87 y.o. adult with medical history significant of large brain meningioma, HTN, HLD, GERD, DVT and A. fib on Coumadin, CKD-3a, BPH, who presents with AMS, cough, SOB, right leg weakness and fall. MD assessment includes: community aquired pneumonia, sepsis, fall at home, and acute metabolic encephalopathy.   PT Comments    Pt was pleasant and agreeable to participate during the session and put forth good effort throughout. Pt was able to complete all ther ex in bed with mod cuing to continue exercises. Pt was min assist with bed mobility for RLE control. Pt demonstrated good sitting balance at EOB with BUE support and feet supported. Pt was min assist with STS from an elevated bed surface. Pt demonstrated fair static standing balance with RW for BUE support for 5 mins x2 with a rest break in between to check vitals. Pt was min guard for safety with ambulation and he was able to take lateral steps at EOB and complete a turn to sit in recliner. PT noted improvement from previous session in functional ability. Pt will benefit from PT services in a SNF setting upon discharge to safely address deficits listed in patient problem list for decreased caregiver assistance and eventual return to PLOF.   Recommendations for follow up therapy are one component of a multi-disciplinary discharge planning process, led by the attending physician.  Recommendations may be updated based on patient status, additional functional criteria and insurance authorization.  Follow Up Recommendations  Skilled nursing-short term rehab (<3 hours/day)     Assistance Recommended at Discharge Frequent or constant Supervision/Assistance  Equipment Recommendations  None recommended by PT    Recommendations for Other Services       Precautions / Restrictions  Precautions Precautions: Fall Restrictions Weight Bearing Restrictions: No     Mobility  Bed Mobility Overal bed mobility: Needs Assistance Bed Mobility: Supine to Sit     Supine to sit: HOB elevated;Min assist     General bed mobility comments: Pt required min assist with RLE control, increased time and effort    Transfers Overall transfer level: Needs assistance Equipment used: Rolling walker (2 wheels) Transfers: Sit to/from Stand Sit to Stand: Min assist           General transfer comment: Increased time and effort, min assist for STS from elevated bed surface    Ambulation/Gait Ambulation/Gait assistance: Min assist Gait Distance (Feet): 5 Feet Assistive device: Rolling walker (2 wheels) Gait Pattern/deviations: Step-to pattern;Decreased step length - right;Decreased step length - left;Decreased stance time - right;Decreased stride length;Decreased weight shift to right Gait velocity: decreased     General Gait Details: Pt required min cuing for RW usage, hardly WB through RLE, able to take lateral steps at EOB, few steps forward/backward   Stairs             Wheelchair Mobility    Modified Rankin (Stroke Patients Only)       Balance Overall balance assessment: Needs assistance Sitting-balance support: Bilateral upper extremity supported;Feet supported Sitting balance-Leahy Scale: Good     Standing balance support: Bilateral upper extremity supported;During functional activity;Reliant on assistive device for balance Standing balance-Leahy Scale: Fair Standing balance comment: Pt demonstrated static standing balance for ~37mins x2 with RW for BUE support  Cognition Arousal/Alertness: Awake/alert Behavior During Therapy: WFL for tasks assessed/performed Overall Cognitive Status: No family/caregiver present to determine baseline cognitive functioning                                           Exercises Total Joint Exercises Ankle Circles/Pumps: AROM;Strengthening;Both;10 reps;Supine Quad Sets: AROM;Both;5 reps;Supine Long Arc Quad: AROM;Strengthening;Both;10 reps;Seated Other Exercises Other Exercises: Static standing for 5 mins x2    General Comments        Pertinent Vitals/Pain Pain Assessment: No/denies pain    Home Living                          Prior Function            PT Goals (current goals can now be found in the care plan section) Acute Rehab PT Goals Patient Stated Goal: go home PT Goal Formulation: With patient Time For Goal Achievement: 09/22/21 Potential to Achieve Goals: Fair Progress towards PT goals: Progressing toward goals    Frequency    Min 2X/week      PT Plan Current plan remains appropriate    Co-evaluation              AM-PAC PT "6 Clicks" Mobility   Outcome Measure  Help needed turning from your back to your side while in a flat bed without using bedrails?: A Little Help needed moving from lying on your back to sitting on the side of a flat bed without using bedrails?: A Little Help needed moving to and from a bed to a chair (including a wheelchair)?: A Little Help needed standing up from a chair using your arms (e.g., wheelchair or bedside chair)?: A Lot Help needed to walk in hospital room?: A Lot Help needed climbing 3-5 steps with a railing? : Total 6 Click Score: 14    End of Session Equipment Utilized During Treatment: Gait belt Activity Tolerance: Patient tolerated treatment well Patient left: in chair;with call bell/phone within reach;with chair alarm set Nurse Communication: Mobility status PT Visit Diagnosis: Unsteadiness on feet (R26.81);Muscle weakness (generalized) (M62.81);History of falling (Z91.81);Difficulty in walking, not elsewhere classified (R26.2)     Time: 2878-6767 PT Time Calculation (min) (ACUTE ONLY): 45 min  Charges:                        Sheldon Silvan  SPT 09/16/21, 1:29 PM

## 2021-09-16 NOTE — Care Management (Signed)
Met with patient's wife today at bedside.  Explained his clinical progression.  Opened his recent MRI brain and explained the worsening meningioma size as well as midline shift and vasogenic edema.  Explained that this was likely to progress but ability to improve and short-term was still unclear.  At this time patient's wife would like to continue to pursue placement at short-term rehab with ultimate plan to go back home.  I explained that if this was the plan that I strongly recommended an outpatient palliative care referral with plans to transition to home hospice if he were to decline post discharge.  She expressed understanding and was appreciative of the information.  Ralene Muskrat MD

## 2021-09-16 NOTE — Consult Note (Signed)
[facility] for warfarin  Indication: atrial fibrillation and DVT  Allergies  Allergen Reactions   Celecoxib Rash    Patient Measurements: Height: 5\' 8"  (172.7 cm) Weight: 86.1 kg (189 lb 13.1 oz) IBW/kg (Calculated) : 68.4  Vital Signs: Temp: 98.1 F (36.7 C) (11/30 0543) Temp Source: Oral (11/30 0543) BP: 125/57 (11/30 0543) Pulse Rate: 73 (11/30 0543)  Labs: Recent Labs    09/14/21 0431 09/15/21 0608 09/16/21 0430  LABPROT 24.1* 24.0* 25.5*  INR 2.2* 2.1* 2.3*    Estimated Creatinine Clearance (by C-G formula based on SCr of 1.05 mg/dL) Male: 41.7 mL/min Male: 50.9 mL/min  Medical History: Past Medical History:  Diagnosis Date   BPH (benign prostatic hyperplasia)    Chronic kidney disease    DVT (deep venous thrombosis) ([facility]) 2014   right leg, IVC filter   Dysrhythmia 2019   Paroxysmal atrial fibrillation   Hypertension     Medications:  Scheduled:   furosemide  20 mg Oral Daily   irbesartan  150 mg Oral Daily   pantoprazole  40 mg Oral BID   simvastatin  20 mg Oral q1800   terazosin  5 mg Oral QHS   warfarin  5 mg Oral q1600   Warfarin - Pharmacist Dosing Inpatient   Does not apply q1600    Assessment: 87yo male with PMH of BPH, CKD, DVT, PAF, and HTN who was admitted to the hospital for cough and SOB. Patient is on warfarin for hx of DVT and PAF. Pharmacy was consulted for warfarin management.   PTA warfarin regimen: 5mg  daily (35mg  weekly)  Drug interactions: ceftriaxone > amoxicillin > ampicillin (new DDI with warfarin)  Date  INR Interpretation/dose 11/22  1.6 Subthera, 5 mg 11/23  1.7 Subthera, 5 mg 11/24  1.7 Subthera, 6 mg  11/25  1.9 Subthera, 5 mg 11/26 1.9  Subthera, 6 mg  11/27 2.2 Therapeutic, 5 mg  11/28   2.2 Therapeutic, 5mg  daily 11/29 2.1 Therapeutic, 5mg   11/30 2.3 Therapeutic  Goal of Therapy:  INR 2-3 Monitor platelets by anticoagulation protocol: Yes   Plan:  INR remains  therapeutic Continue warfarin 5mg  daily Continue daily INR and CBC at least every 3 days  Lenardo Westwood Rodriguez-Guzman PharmD, BCPS 09/16/2021 7:42 AM

## 2021-09-16 NOTE — Progress Notes (Signed)
Occupational Therapy Treatment Patient Details Name: Alexander Lane MRN: [id number] DOB: 03/24/1930 Today's Date: 09/16/2021   History of present illness Pt is a 87 y.o. adult with medical history significant of large brain meningioma, HTN, HLD, GERD, DVT and A. fib on Coumadin, CKD-3a, BPH, who presents with AMS, cough, SOB, right leg weakness and fall. MD assessment includes: community aquired pneumonia, sepsis, fall at home, and acute metabolic encephalopathy.   OT comments  Pt up in chair upon OT arrival.  Pt confused with time of day, thinking it was night time.  OT oriented pt to late morning.  Performed sit to stand x3 for peri care and gown change d/t stool on gown and on seat pad.  Min A for all 3 sit to stands, with pt able to tolerate peri hygiene in standing with min guard.  Pt requested return to bed, requiring min A only to lift LLE up to bed.  In supine with head elevated, pt participated in ankle pumps x20, AROM for BUEs including digit flex/ext, elbow flex/ext, and active assisted bilat shoulder flexion x2 sets 10 reps each.  Left pt in bed with all needs met, heels floating, call light and phone within reach, and nursing notified of pt back in bed.    Recommendations for follow up therapy are one component of a multi-disciplinary discharge planning process, led by the attending physician.  Recommendations may be updated based on patient status, additional functional criteria and insurance authorization.    Follow Up Recommendations  Skilled nursing-short term rehab (<3 hours/day)    Assistance Recommended at Discharge Frequent or constant Supervision/Assistance  Equipment Recommendations  BSC/3in1;Tub/shower seat    Recommendations for Other Services      Precautions / Restrictions Precautions Precautions: Fall Restrictions Weight Bearing Restrictions: No       Mobility Bed Mobility Overal bed mobility: Needs Assistance Bed Mobility: Supine to Sit     Supine  to sit: HOB elevated;Min assist Sit to supine: Min assist   General bed mobility comments: min A to lift LLE into bed Patient Response: Cooperative  Transfers Overall transfer level: Needs assistance Equipment used: Rolling walker (2 wheels) Transfers: Sit to/from Stand Sit to Stand: Min assist           General transfer comment: 3x sit to stand with min A from recliner using RW     Balance Overall balance assessment: Needs assistance Sitting-balance support: Bilateral upper extremity supported;Feet supported Sitting balance-Leahy Scale: Good     Standing balance support: Bilateral upper extremity supported;During functional activity;Reliant on assistive device for balance Standing balance-Leahy Scale: Fair Standing balance comment: static standing with close supv with BUEs on walker, min A dynamic standing                           ADL either performed or assessed with clinical judgement   ADL Overall ADL's : Needs assistance/impaired                 Upper Body Dressing : Set up;Sitting Upper Body Dressing Details (indicate cue type and reason): gown change d/t soiled         Toileting- Clothing Manipulation and Hygiene: Maximal assistance;Sit to/from stand Toileting - Clothing Manipulation Details (indicate cue type and reason): peri care after loose BM in chair, OT assisted with cleaning in standing, pt supported by walker and min guard by OT for standing balance     Functional mobility during ADLs: Min  guard General ADL Comments: min A sit to stand x3; min guard to use RW to shuffle to EOB.    Extremity/Trunk Assessment Upper Extremity Assessment Upper Extremity Assessment: Generalized weakness   Lower Extremity Assessment Lower Extremity Assessment: Generalized weakness                          Cognition Arousal/Alertness: Awake/alert Behavior During Therapy: WFL for tasks assessed/performed Overall Cognitive Status: No  family/caregiver present to determine baseline cognitive functioning                                 General Comments: Pt confused to time of day, thinking it was 11pm, not 11am          Exercises Total Joint Exercises Ankle Circles/Pumps: AROM;Strengthening;Both;10 reps;Supine Quad Sets: AROM;Both;5 reps;Supine Long Arc Quad: AROM;Strengthening;Both;10 reps;Seated Other Exercises Other Exercises: Static standing for 5 mins x2   Shoulder Instructions       General Comments      Pertinent Vitals/ Pain       Pain Assessment: No/denies pain                                                          Frequency  Min 2X/week        Progress Toward Goals  OT Goals(current goals can now be found in the care plan section)  Progress towards OT goals: Progressing toward goals  Acute Rehab OT Goals Patient Stated Goal: to get stronger OT Goal Formulation: With patient Time For Goal Achievement: 09/23/21 Potential to Achieve Goals: Good ADL Goals Pt Will Perform Upper Body Dressing: with supervision Pt Will Perform Lower Body Dressing: with min assist;sit to/from stand Pt Will Transfer to Toilet: with min assist;with min guard assist;ambulating (RW to [facility] ~10' away to improve balance) Pt Will Perform Toileting - Clothing Manipulation and hygiene: with min assist;sitting/lateral leans  Plan Discharge plan remains appropriate;Frequency needs to be updated    Co-evaluation                 AM-PAC OT "6 Clicks" Daily Activity     Outcome Measure   Help from another person eating meals?: A Little Help from another person taking care of personal grooming?: A Little Help from another person toileting, which includes using toliet, bedpan, or urinal?: A Lot Help from another person bathing (including washing, rinsing, drying)?: A Lot Help from another person to put on and taking off regular upper body clothing?: A Little Help from  another person to put on and taking off regular lower body clothing?: A Lot 6 Click Score: 15    End of Session Equipment Utilized During Treatment: Gait belt;Rolling walker (2 wheels)  OT Visit Diagnosis: Unsteadiness on feet (R26.81);Muscle weakness (generalized) (M62.81);History of falling (Z91.81)   Activity Tolerance Patient tolerated treatment well   Patient Left in bed;with call bell/phone within reach;with bed alarm set   Nurse Communication Mobility status;Other (comment)        Time: 8850-2774 OT Time Calculation (min): 31 min  Charges: OT General Charges $OT Visit: 1 Visit OT Treatments $Self Care/Home Management : 8-22 mins $Therapeutic Exercise: 8-22 mins  Alexander Speller, MS, OTR/L   Alexander Lane  09/16/2021, 1:04 PM

## 2021-09-17 DIAGNOSIS — J189 Pneumonia, unspecified organism: Secondary | ICD-10-CM | POA: Diagnosis not present

## 2021-09-17 DIAGNOSIS — Z66 Do not resuscitate: Secondary | ICD-10-CM

## 2021-09-17 LAB — RESP PANEL BY RT-PCR (FLU A&B, COVID) ARPGX2
Influenza A by PCR: NEGATIVE
Influenza B by PCR: NEGATIVE
SARS Coronavirus 2 by RT PCR: NEGATIVE

## 2021-09-17 LAB — CBC
HCT: 31.2 % — ABNORMAL LOW (ref 39.0–52.0)
Hemoglobin: 10.5 g/dL — ABNORMAL LOW (ref 13.0–17.0)
MCH: 29.4 pg (ref 26.0–34.0)
MCHC: 33.7 g/dL (ref 30.0–36.0)
MCV: 87.4 fL (ref 80.0–100.0)
Platelets: 264 10*3/uL (ref 150–400)
RBC: 3.57 MIL/uL — ABNORMAL LOW (ref 4.22–5.81)
RDW: 12.9 % (ref 11.5–15.5)
WBC: 9.6 10*3/uL (ref 4.0–10.5)
nRBC: 0 % (ref 0.0–0.2)

## 2021-09-17 LAB — PROTIME-INR
INR: 2.4 — ABNORMAL HIGH (ref 0.8–1.2)
Prothrombin Time: 26.4 seconds — ABNORMAL HIGH (ref 11.4–15.2)

## 2021-09-17 MED ORDER — LEVETIRACETAM 250 MG PO TABS: 250.0000 mg | ORAL_TABLET | Freq: Two times a day (BID) | ORAL | Status: AC

## 2021-09-17 NOTE — Progress Notes (Signed)
After reviewing the patient's chart, epic notes, labs, and imaging, it is clear that goals are set.  As per attendings discussion with family, plan is set for short-term rehab.  As my colleague Aniceto Boss outlined with the patient's wife earlier this week patient will be followed by outpatient palliative services.  Palliative medicine team will continue to follow the patient throughout his hospitalization but only intervene if called or patient's status declines.  Code Status: DNR Plan: SNF for rehab with outpt. Palliative services  Curt Bears L. Ilsa Iha, FNP-BC Palliative Medicine Team Team Phone # [phone]   NO CHARGE

## 2021-09-17 NOTE — Care Management Important Message (Signed)
Important Message  Patient Details  Name: Alexander Lane MRN: [id number] Date of Birth: 1929/12/22   Medicare Important Message Given:  Yes     [name] 09/17/2021, 1:40 PM

## 2021-09-17 NOTE — Discharge Summary (Signed)
Physician Discharge Summary  Alexander Lane KGY:[id number] DOB: 03-29-1930 DOA: 09/08/2021  PCP: Rusty Aus, MD  Admit date: 09/08/2021 Discharge date: 09/17/2021  Admitted From: Home Disposition: Skilled nursing facility  Recommendations for Outpatient Follow-up:  Follow up with PCP in 1-2 weeks Referral for outpatient palliative care services  Home Health: No Equipment/Devices: None  Discharge Condition: Stable CODE STATUS: DNR Diet recommendation: Dysphagia 2  Brief/Interim Summary: 87 yo male with PMH  large brain meningioma, HTN, HLD, GERD, DVT and A. fib on Coumadin, CKD-3a, BPH, who presented with AMS, cough, SOB, right leg weakness and fall.   He was noted to be febrile on arrival to the ER, 101.4; he was also reported to have progressive worsening weakness at home per his wife and worsened mentation. He underwent work-up on admission.  CT head showed his known large left frontal extra-axial mass and appeared essentially similar in size to prior imaging with underlying mass-effect and edema involving left frontal lobe. CXR showed a mild airspace opacity involving the left hemidiaphragm new from prior imaging concerning for possible pneumonia. Blood cultures were obtained on admission and became positive in 3/4 bottles for strep pneumonia.  Patient completed treatment for sepsis secondary to streptococcal pneumonia.  Not septic at time of discharge.  Completed course of antibiotics in house.  No antibiotics indicated at time of discharge.  The day prior to discharge had a lengthy conversation with the patient's wife at bedside.  I did review the images with her including the MRI brain that demonstrated worsening of known meningioma with worsening midline shift and vasogenic edema.  I explained that this is almost certainly down to worsen and his neurologic status will deteriorate.  His deterioration could be sudden and I strongly encouraged outpatient palliative care  follow-up.  I do not feel the rehospitalization would be beneficial for this patient.  Patient's wife expressed understanding and appreciative and is for the explanation.  [facility] condition.  Outpatient palliative care referral at time of discharge   Discharge Diagnoses:  Principal Problem:   CAP (community acquired pneumonia) Active Problems:   BPH (benign prostatic hyperplasia)   Acute renal failure superimposed on stage 3a chronic kidney disease (HCC)   Hypertension   PAF (paroxysmal atrial fibrillation) (HCC)   Meningioma, cerebral ([place])   Fall at home, initial encounter   HLD (hyperlipidemia)   Severe sepsis ([place])   Physical deconditioning   Bacteremia due to Gram-positive bacteria  * CAP (community acquired pneumonia) - Small airspace opacity noted along left hemidiaphragm on CXR.  Given strep pneumonia bacteremia, pulmonary source may be culprit -Tolerating antibiotics Plan: Complete empiric course of antibiotics.  Last dose of ampicillin 11/30     Severe sepsis (HCC) - Febrile, tachycardia, tachypnea, leukocytosis, altered mentation; source presumed pulmonary along with seeding of blood -Repeat blood cultures on 09/10/2021; no growth to date Plan: Sepsis physiology resolving.  We will complete course of antibiotics as above.  Last dose 11/30.  Discharge to skilled nursing facility on 12/1     Meningioma, cerebral [facility]) - mostly same size on CTH; no surgical intervention when last seen by neurosurgery (wife decline surgical intervention) - given CTH findings, patient started back on keppra and decadron this admission; d/c decadron on 11/26, continue keppra - worsening RLE weakness on 11/28, worsening dysphagia, aspiration and overall functional decline - repeat MRI brain on 11/29 shows increase in midline shift from 11 mm to 18 mm and increased size  of mass. He also clinically worsened after decadron was stopped on 11/26 -  given the above, his prognosis is poor; wife would like to see how he responds and attempt to get him into short-term rehab with ultimate plan to get back home.  We were able to [facility] a bed [facility].  Patient will discharge to skilled nursing facility in stable condition.  Outpatient referral to palliative care   Bacteremia due to Gram-positive bacteria - 3/4 bottles from 09/08/21 growing Strep pna; for now suspecting translocation from presumed PNA - repeat blood cultures ;on 11/24; negative x 1 day - s/p rocephin empirically Plan: Continue ampicillin.  Last dose 11/30.  Completed antibiotics in house     Physical deconditioning - Likely multifactorial from underlying meningioma and overall deconditioning from underlying bacteremia - PT/OT eval's: SNF recommended.  Patient's wife agreed to skilled nursing facility bed placement   Discharge Instructions  Discharge Instructions     Diet - low sodium heart healthy   Complete by: As directed    Increase activity slowly   Complete by: As directed       Allergies as of 09/17/2021       Reactions   Celecoxib Rash        Medication List     STOP taking these medications    ipratropium 17 MCG/ACT inhaler Commonly known as: ATROVENT HFA       TAKE these medications    acetaminophen 500 MG tablet Commonly known as: TYLENOL Take 500-1,000 mg by mouth every 6 (six) hours as needed for mild pain or moderate pain.   amLODipine 2.5 MG tablet Commonly known as: NORVASC Take 2.5 mg by mouth at bedtime.   furosemide 20 MG tablet Commonly known as: LASIX Take 20 mg by mouth daily.   levETIRAcetam 250 MG tablet Commonly known as: Keppra Take 1 tablet (250 mg total) by mouth 2 (two) times daily.   olmesartan 20 MG tablet Commonly known as: BENICAR Take 20 mg by mouth daily.   pantoprazole 40 MG tablet Commonly known as: PROTONIX Take 40 mg by mouth 2 (two) times daily.   simvastatin 20 MG tablet Commonly  known as: ZOCOR Take 20 mg by mouth at bedtime.   terazosin 5 MG capsule Commonly known as: HYTRIN Take 5 mg by mouth at bedtime.   warfarin 5 MG tablet Commonly known as: COUMADIN Take 5 mg by mouth every evening.        Contact information for after-discharge care     Destination     [facility] Preferred [facility]F .   Service: Skilled Nursing Contact information: [place] [phone]                    Allergies  Allergen Reactions   Celecoxib Rash    Consultations: Palliative care   Procedures/Studies: CT HEAD WO CONTRAST (5MM)  Result Date: 09/08/2021 CLINICAL DATA:  fall, confusion. on coumadin. eval ich EXAM: CT HEAD WITHOUT CONTRAST TECHNIQUE: Contiguous axial images were obtained from the base of the skull through the vertex without intravenous contrast. COMPARISON:  Mar 08, 2021. FINDINGS: Brain: Similar versus mildly increased size of the large left frontal convexity mass, better characterized on prior MRI. Similar extensive mass effect and edema in the left frontal lobe with approximately 11 mm of subfalcine herniation. No evidence of acute hemorrhage. Similar mass effect and effacement of the ventricles anteriorly without evidence of hydrocephalus. No evidence of  acute large vascular territory infarct. Vascular: No hyperdense vessel identified. Intracranial atherosclerosis. Skull: No acute fracture. Sinuses/Orbits: Visualized sinuses are clear. No acute orbital findings. Other: No mastoid effusions. IMPRESSION: 1. The large left frontal extra-axial mass appears similar versus mildly increased in size; however, evaluation is limited on this noncontrast head CT. Similar extensive mass effect and edema in the left frontal lobe with approximately 11 mm of subfalcine herniation. An MRI with contrast could provide more sensitive evaluation for progression if clinically indicated. 2. No evidence of  superimposed/interval acute abnormality. Electronically Signed   By: Margaretha Sheffield M.D.   On: 09/08/2021 15:15   MR BRAIN W WO CONTRAST  Result Date: 09/15/2021 CLINICAL DATA:  Brain mass. EXAM: MRI HEAD WITHOUT AND WITH CONTRAST TECHNIQUE: Multiplanar, multiecho pulse sequences of the brain and surrounding structures were obtained without and with intravenous contrast. CONTRAST:  7.80mL GADAVIST GADOBUTROL 1 MMOL/ML IV SOLN COMPARISON:  Head CT 09/08/2021 and MRI 03/08/2021 FINDINGS: Brain: An extra-axial mass anteriorly over the left frontal convexity has mildly enlarged from the prior MRI, measuring 5.5 x 6.0 x 5.4 cm. The mass again demonstrates avidly enhancing solid components as well as prominent cystic components. Extensive edema in the left frontal lobe extending into the genu of the corpus callosum is stable to minimally increased compared to the prior MRI, and prominent mass effect is again noted on the left frontal lobe and ventricles with 1.8 cm of rightward midline shift which has slightly increased from the prior MRI upon remeasurement. No acute infarct, intracranial hemorrhage, or extra-axial fluid collection is identified. Patchy T2 hyperintensities elsewhere in the cerebral white matter are similar to the prior MRI and are nonspecific but compatible with moderate chronic small vessel ischemic disease. There is mild cerebral atrophy. Vascular: Major intracranial vascular flow voids are preserved. Skull and upper cervical spine: Unremarkable bone marrow signal. Chronic nonunited dens fracture. Sinuses/Orbits: Unremarkable orbits. Clear paranasal sinuses. Small left mastoid effusion. Other: None. IMPRESSION: 1. Mildly increased size of left frontal extra-axial mass compatible with meningioma since 03/08/2021. Extensive left frontal lobe edema with mildly increased rightward midline shift, now 1.8 cm. 2. Moderate chronic small vessel ischemic disease. 3. No acute infarct. Electronically Signed    By: Logan Bores M.D.   On: 09/15/2021 14:25   DG Chest Port 1 View  Result Date: 09/08/2021 CLINICAL DATA:  Altered mental status, possible sepsis EXAM: PORTABLE CHEST 1 VIEW COMPARISON:  03/13/2021 FINDINGS: Atherosclerotic calcification of the aortic arch. Thoracic spondylosis. Mild opacity along the left hemidiaphragm favors atelectasis with early pneumonia is a less likely differential diagnostic consideration. Lungs appear otherwise clear. Medial retrocardiac density favors hiatal hernia. IMPRESSION: 1. Mild airspace opacity along the left hemidiaphragm is new from 03/13/2021, and nonspecific for atelectasis versus or early pneumonia. 2. Small hiatal hernia. 3.  Aortic Atherosclerosis (ICD10-I70.0). 4. Thoracic spondylosis. Electronically Signed   By: Van Clines M.D.   On: 09/08/2021 14:35      Subjective: Seen and examined the day of discharge.  Mental status at baseline.  Visibly no distress.  Discharge Exam: Vitals:   09/17/21 0810 09/17/21 1211  BP: 122/69 (!) 124/57  Pulse: 73 69  Resp: 18 16  Temp: 97.9 F (36.6 C) 98.7 F (37.1 C)  SpO2: 93% 96%   Vitals:   09/17/21 0040 09/17/21 0608 09/17/21 0810 09/17/21 1211  BP: (!) 100/51 (!) 142/63 122/69 (!) 124/57  Pulse: 73 76 73 69  Resp: 16 14 18 16   Temp: 99.4 F (37.4  C) 98.5 F (36.9 C) 97.9 F (36.6 C) 98.7 F (37.1 C)  TempSrc:   Oral Oral  SpO2: 96% 97% 93% 96%  Weight:      Height:        General: Pt is alert, awake, not in acute distress Cardiovascular: RRR, S1/S2 +, no rubs, no gallops Respiratory: CTA bilaterally, no wheezing, no rhonchi Abdominal: Soft, NT, ND, bowel sounds + Extremities: no edema, no cyanosis    The results of significant diagnostics from this hospitalization (including imaging, microbiology, ancillary and laboratory) are listed below for reference.     Microbiology: Recent Results (from the past 240 hour(s))  Blood Culture (routine x 2)     Status: Abnormal    Collection Time: 09/08/21  1:35 PM   Specimen: BLOOD  Result Value Ref Range Status   Specimen Description   Final    BLOOD LEFT ANTECUBITAL Performed at [facility], [address] [place], [place] [address]    Special Requests   Final    BOTTLES DRAWN AEROBIC AND ANAEROBIC Blood Culture results may not be optimal due to an excessive volume of blood received in culture bottles Performed at [facility], [address], [place], [place] [address]    Culture  Setup Time   Final    GRAM POSITIVE COCCI AEROBIC BOTTLE ONLY CRITICAL VALUE NOTED.  VALUE IS CONSISTENT WITH PREVIOUSLY REPORTED AND CALLED VALUE. Performed at [facility], [address] [place], [place] [address]    Culture (A)  Final    STREPTOCOCCUS PNEUMONIAE SUSCEPTIBILITIES PERFORMED ON PREVIOUS CULTURE WITHIN THE LAST 5 DAYS. Performed at [facility], [place] [address], [place], [place] [address]    Report Status 09/11/2021 FINAL  Final  Blood Culture (routine x 2)     Status: Abnormal   Collection Time: 09/08/21  1:35 PM   Specimen: BLOOD  Result Value Ref Range Status   Specimen Description   Final    BLOOD RIGHT ANTECUBITAL Performed at [facility], [address], [place], [place] [address]    Special Requests   Final    BOTTLES DRAWN AEROBIC AND ANAEROBIC Blood Culture adequate volume Performed at [facility], [address], [place], [place] [address]    Culture  Setup Time   Final    GRAM POSITIVE COCCI IN BOTH AEROBIC AND ANAEROBIC BOTTLES Organism ID to follow CRITICAL RESULT CALLED TO, READ BACK BY AND VERIFIED WITHVioleta Gelinas [facility] [id number] 09/09/21 HNM Performed at [facility], [address] [place], [place] [address]    Culture STREPTOCOCCUS PNEUMONIAE (A)  Final   Report Status 09/11/2021 FINAL  Final   Organism ID, Bacteria STREPTOCOCCUS PNEUMONIAE  Final      Susceptibility   Streptococcus pneumoniae - MIC*     ERYTHROMYCIN >=8 RESISTANT Resistant     LEVOFLOXACIN 1 SENSITIVE Sensitive     VANCOMYCIN 0.5 SENSITIVE Sensitive     PENICILLIN (meningitis) <=0.06 SENSITIVE Sensitive     PENO - penicillin <=0.06      PENICILLIN (non-meningitis) <=0.06 SENSITIVE Sensitive     PENICILLIN (oral) <=0.06 SENSITIVE Sensitive     CEFTRIAXONE (non-meningitis) <=0.12 SENSITIVE Sensitive     CEFTRIAXONE (meningitis) <=0.12 SENSITIVE Sensitive     * STREPTOCOCCUS PNEUMONIAE  Urine Culture     Status: Abnormal   Collection Time: 09/08/21  1:35 PM   Specimen: In/Out Cath Urine  Result Value Ref Range Status   Specimen Description   Final  IN/OUT CATH URINE Performed at [facility], [address] [place], [place] [address]    Special Requests   Final    NONE Performed at [facility], [address] [place], [place] [address]    Culture MULTIPLE SPECIES PRESENT, SUGGEST RECOLLECTION (A)  Final   Report Status 09/10/2021 FINAL  Final  Resp Panel by RT-PCR (Flu A&B, Covid) Nasopharyngeal Swab     Status: None   Collection Time: 09/08/21  1:35 PM   Specimen: Nasopharyngeal Swab; Nasopharyngeal(NP) swabs in vial transport medium  Result Value Ref Range Status   SARS Coronavirus 2 by RT PCR NEGATIVE NEGATIVE Final    Comment: (NOTE) SARS-CoV-2 target nucleic acids are NOT DETECTED.  The SARS-CoV-2 RNA is generally detectable in upper respiratory specimens during the acute phase of infection. The lowest concentration of SARS-CoV-2 viral copies this assay can detect is 138 copies/mL. A negative result does not preclude SARS-Cov-2 infection and should not be used as the sole basis for treatment or other patient management decisions. A negative result may occur with  improper specimen collection/handling, submission of specimen other than nasopharyngeal swab, presence of viral mutation(s) within the areas targeted by this assay, and inadequate number of viral copies(<138 copies/mL). A  negative result must be combined with clinical observations, patient history, and epidemiological information. The expected result is Negative.  Fact Sheet for Patients:  EntrepreneurPulse.com.au  Fact Sheet for Healthcare Providers:  IncredibleEmployment.be  This test is no t yet approved or cleared by the [place] FDA and  has been authorized for detection and/or diagnosis of SARS-CoV-2 by FDA under an Emergency Use Authorization (EUA). This EUA will remain  in effect (meaning this test can be used) for the duration of the COVID-19 declaration under Section 564(b)(1) of the Act, 21 U.S.C.section 360bbb-3(b)(1), unless the authorization is terminated  or revoked sooner.       Influenza A by PCR NEGATIVE NEGATIVE Final   Influenza B by PCR NEGATIVE NEGATIVE Final    Comment: (NOTE) The Xpert Xpress SARS-CoV-2/FLU/RSV plus assay is intended as an aid in the diagnosis of influenza from Nasopharyngeal swab specimens and should not be used as a sole basis for treatment. Nasal washings and aspirates are unacceptable for Xpert Xpress SARS-CoV-2/FLU/RSV testing.  Fact Sheet for Patients: EntrepreneurPulse.com.au  Fact Sheet for Healthcare Providers: IncredibleEmployment.be  This test is not yet approved or cleared by the [place] FDA and has been authorized for detection and/or diagnosis of SARS-CoV-2 by FDA under an Emergency Use Authorization (EUA). This EUA will remain in effect (meaning this test can be used) for the duration of the COVID-19 declaration under Section 564(b)(1) of the Act, 21 U.S.C. section 360bbb-3(b)(1), unless the authorization is terminated or revoked.  Performed at [facility], [address] [place], [place] [address]   Blood Culture ID Panel (Reflexed)     Status: Abnormal   Collection Time: 09/08/21  1:35 PM  Result Value Ref Range Status   Enterococcus  faecalis NOT DETECTED NOT DETECTED Final   Enterococcus Faecium NOT DETECTED NOT DETECTED Final   Listeria monocytogenes NOT DETECTED NOT DETECTED Final   Staphylococcus species NOT DETECTED NOT DETECTED Final   Staphylococcus aureus (BCID) NOT DETECTED NOT DETECTED Final   Staphylococcus epidermidis NOT DETECTED NOT DETECTED Final   Staphylococcus lugdunensis NOT DETECTED NOT DETECTED Final   Streptococcus species DETECTED (A) NOT DETECTED Final    Comment: CRITICAL RESULT CALLED TO, READ BACK BY AND VERIFIED WITH: JASON ROBBINS  PHARMD [id number] 09/09/21 HNM    Streptococcus agalactiae NOT DETECTED NOT DETECTED Final   Streptococcus pneumoniae DETECTED (A) NOT DETECTED Final    Comment: CRITICAL RESULT CALLED TO, READ BACK BY AND VERIFIED WITH: Violeta Gelinas PHARMD [id number] 09/09/21 HNM    Streptococcus pyogenes NOT DETECTED NOT DETECTED Final   A.calcoaceticus-baumannii NOT DETECTED NOT DETECTED Final   Bacteroides fragilis NOT DETECTED NOT DETECTED Final   Enterobacterales NOT DETECTED NOT DETECTED Final   Enterobacter cloacae complex NOT DETECTED NOT DETECTED Final   Escherichia coli NOT DETECTED NOT DETECTED Final   Klebsiella aerogenes NOT DETECTED NOT DETECTED Final   Klebsiella oxytoca NOT DETECTED NOT DETECTED Final   Klebsiella pneumoniae NOT DETECTED NOT DETECTED Final   Proteus species NOT DETECTED NOT DETECTED Final   Salmonella species NOT DETECTED NOT DETECTED Final   Serratia marcescens NOT DETECTED NOT DETECTED Final   Haemophilus influenzae NOT DETECTED NOT DETECTED Final   Neisseria meningitidis NOT DETECTED NOT DETECTED Final   Pseudomonas aeruginosa NOT DETECTED NOT DETECTED Final   Stenotrophomonas maltophilia NOT DETECTED NOT DETECTED Final   Candida albicans NOT DETECTED NOT DETECTED Final   Candida auris NOT DETECTED NOT DETECTED Final   Candida glabrata NOT DETECTED NOT DETECTED Final   Candida krusei NOT DETECTED NOT DETECTED Final   Candida parapsilosis NOT  DETECTED NOT DETECTED Final   Candida tropicalis NOT DETECTED NOT DETECTED Final   Cryptococcus neoformans/gattii NOT DETECTED NOT DETECTED Final    Comment: Performed at [facility], [address] [place], [place] [address]  CULTURE, BLOOD (ROUTINE X 2) w Reflex to ID Panel     Status: None   Collection Time: 09/10/21  5:34 AM   Specimen: BLOOD  Result Value Ref Range Status   Specimen Description BLOOD LHAND  Final   Special Requests   Final    BOTTLES DRAWN AEROBIC AND ANAEROBIC Blood Culture adequate volume   Culture   Final    NO GROWTH 5 DAYS Performed at [facility], [address] [place], [place] [address]    Report Status 09/15/2021 FINAL  Final  CULTURE, BLOOD (ROUTINE X 2) w Reflex to ID Panel     Status: None   Collection Time: 09/10/21  5:38 AM   Specimen: BLOOD  Result Value Ref Range Status   Specimen Description BLOOD RTHAND  Final   Special Requests   Final    BOTTLES DRAWN AEROBIC AND ANAEROBIC Blood Culture adequate volume   Culture   Final    NO GROWTH 5 DAYS Performed at [facility], [address] [place], [place] [address]    Report Status 09/15/2021 FINAL  Final  SARS CORONAVIRUS 2 (TAT 6-24 HRS) Nasopharyngeal Nasopharyngeal Swab     Status: None   Collection Time: 09/13/21  3:42 PM   Specimen: Nasopharyngeal Swab  Result Value Ref Range Status   SARS Coronavirus 2 NEGATIVE NEGATIVE Final    Comment: (NOTE) SARS-CoV-2 target nucleic acids are NOT DETECTED.  The SARS-CoV-2 RNA is generally detectable in upper and lower respiratory specimens during the acute phase of infection. Negative results do not preclude SARS-CoV-2 infection, do not rule out co-infections with other pathogens, and should not be used as the sole basis for treatment or other patient management decisions. Negative results must be combined with clinical observations, patient history, and epidemiological information. The expected result is  Negative.  Fact Sheet for Patients: SugarRoll.be  Fact Sheet for Healthcare Providers: https://www.woods-mathews.com/  This test is  not yet approved or cleared by the [place] and  has been authorized for detection and/or diagnosis of SARS-CoV-2 by FDA under an Emergency Use Authorization (EUA). This EUA will remain  in effect (meaning this test can be used) for the duration of the COVID-19 declaration under Se ction 564(b)(1) of the Act, 21 U.S.C. section 360bbb-3(b)(1), unless the authorization is terminated or revoked sooner.  Performed at [facility], [place] [address], [place], [place] [address]      Labs: BNP (last 3 results) Recent Labs    09/08/21 1335  BNP 213.0*   Basic Metabolic Panel: Recent Labs  Lab 09/11/21 0508 09/12/21 0404 09/13/21 0427  NA 138 138 137  K 4.1 4.2 4.3  CL 107 105 105  CO2 27 28 29   GLUCOSE 125* 114* 96  BUN 39* 35* 34*  CREATININE 1.28* 1.05 1.05  CALCIUM 8.5* 8.8* 8.6*  MG 2.1 2.4 2.3   Liver Function Tests: No results for input(s): AST, ALT, ALKPHOS, BILITOT, PROT, ALBUMIN in the last 168 hours. No results for input(s): LIPASE, AMYLASE in the last 168 hours. No results for input(s): AMMONIA in the last 168 hours. CBC: Recent Labs  Lab 09/11/21 0508 09/12/21 0404 09/13/21 0427 09/17/21 0502  WBC 12.6* 9.8 9.8 9.6  NEUTROABS 10.8* 7.9* 6.5  --   HGB 10.3* 10.8* 11.0* 10.5*  HCT 31.3* 32.8* 32.9* 31.2*  MCV 88.7 88.4 87.3 87.4  PLT 237 264 251 264   Cardiac Enzymes: No results for input(s): CKTOTAL, CKMB, CKMBINDEX, TROPONINI in the last 168 hours. BNP: Invalid input(s): POCBNP CBG: No results for input(s): GLUCAP in the last 168 hours. D-Dimer No results for input(s): DDIMER in the last 72 hours. Hgb A1c No results for input(s): HGBA1C in the last 72 hours. Lipid Profile No results for input(s): CHOL, HDL, LDLCALC, TRIG, CHOLHDL, LDLDIRECT in the last 72  hours. Thyroid function studies No results for input(s): TSH, T4TOTAL, T3FREE, THYROIDAB in the last 72 hours.  Invalid input(s): FREET3 Anemia work up No results for input(s): VITAMINB12, FOLATE, FERRITIN, TIBC, IRON, RETICCTPCT in the last 72 hours. Urinalysis    Component Value Date/Time   COLORURINE YELLOW (A) 09/08/2021 1335   APPEARANCEUR CLEAR (A) 09/08/2021 1335   LABSPEC 1.010 09/08/2021 1335   PHURINE 5.0 09/08/2021 1335   GLUCOSEU NEGATIVE 09/08/2021 1335   HGBUR NEGATIVE 09/08/2021 1335   BILIRUBINUR NEGATIVE 09/08/2021 1335   KETONESUR NEGATIVE 09/08/2021 1335   PROTEINUR NEGATIVE 09/08/2021 1335   NITRITE NEGATIVE 09/08/2021 1335   LEUKOCYTESUR NEGATIVE 09/08/2021 1335   Sepsis Labs Invalid input(s): PROCALCITONIN,  WBC,  LACTICIDVEN Microbiology Recent Results (from the past 240 hour(s))  Blood Culture (routine x 2)     Status: Abnormal   Collection Time: 09/08/21  1:35 PM   Specimen: BLOOD  Result Value Ref Range Status   Specimen Description   Final    BLOOD LEFT ANTECUBITAL Performed at [facility], [address] [place], [place] [place] [id number]    Special Requests   Final    BOTTLES DRAWN AEROBIC AND ANAEROBIC Blood Culture results may not be optimal due to an excessive volume of blood received in culture bottles Performed at [facility], [address], [place], [place] [address]    Culture  Setup Time   Final    GRAM POSITIVE COCCI AEROBIC BOTTLE ONLY CRITICAL VALUE NOTED.  VALUE IS CONSISTENT WITH PREVIOUSLY REPORTED AND CALLED VALUE. Performed at [facility], [address] [place], [place] [address]    Culture (A)  Final    STREPTOCOCCUS PNEUMONIAE SUSCEPTIBILITIES PERFORMED ON PREVIOUS CULTURE WITHIN THE LAST 5 DAYS. Performed at [facility], [place] [address] [place], [place] [address]    Report Status 09/11/2021 FINAL  Final  Blood Culture (routine x 2)     Status: Abnormal   Collection Time: 09/08/21   1:35 PM   Specimen: BLOOD  Result Value Ref Range Status   Specimen Description   Final    BLOOD RIGHT ANTECUBITAL Performed at [facility], [address], [place], [place] [address]    Special Requests   Final    BOTTLES DRAWN AEROBIC AND ANAEROBIC Blood Culture adequate volume Performed at [facility], [address] [place], [place] [address]    Culture  Setup Time   Final    GRAM POSITIVE COCCI IN BOTH AEROBIC AND ANAEROBIC BOTTLES Organism ID to follow CRITICAL RESULT CALLED TO, READ BACK BY AND VERIFIED WITHVioleta Gelinas [facility] [id number] 09/09/21 HNM Performed at [facility], [address] [place], [place] [address]    Culture STREPTOCOCCUS PNEUMONIAE (A)  Final   Report Status 09/11/2021 FINAL  Final   Organism ID, Bacteria STREPTOCOCCUS PNEUMONIAE  Final      Susceptibility   Streptococcus pneumoniae - MIC*    ERYTHROMYCIN >=8 RESISTANT Resistant     LEVOFLOXACIN 1 SENSITIVE Sensitive     VANCOMYCIN 0.5 SENSITIVE Sensitive     PENICILLIN (meningitis) <=0.06 SENSITIVE Sensitive     PENO - penicillin <=0.06      PENICILLIN (non-meningitis) <=0.06 SENSITIVE Sensitive     PENICILLIN (oral) <=0.06 SENSITIVE Sensitive     CEFTRIAXONE (non-meningitis) <=0.12 SENSITIVE Sensitive     CEFTRIAXONE (meningitis) <=0.12 SENSITIVE Sensitive     * STREPTOCOCCUS PNEUMONIAE  Urine Culture     Status: Abnormal   Collection Time: 09/08/21  1:35 PM   Specimen: In/Out Cath Urine  Result Value Ref Range Status   Specimen Description   Final    IN/OUT CATH URINE Performed at [facility], [address], [place], [place] [address]    Special Requests   Final    NONE Performed at [facility], [address] [place], [place] [address]    Culture MULTIPLE SPECIES PRESENT, SUGGEST RECOLLECTION (A)  Final   Report Status 09/10/2021 FINAL  Final  Resp Panel by RT-PCR (Flu A&B, Covid) Nasopharyngeal Swab     Status: None   Collection  Time: 09/08/21  1:35 PM   Specimen: Nasopharyngeal Swab; Nasopharyngeal(NP) swabs in vial transport medium  Result Value Ref Range Status   SARS Coronavirus 2 by RT PCR NEGATIVE NEGATIVE Final    Comment: (NOTE) SARS-CoV-2 target nucleic acids are NOT DETECTED.  The SARS-CoV-2 RNA is generally detectable in upper respiratory specimens during the acute phase of infection. The lowest concentration of SARS-CoV-2 viral copies this assay can detect is 138 copies/mL. A negative result does not preclude SARS-Cov-2 infection and should not be used as the sole basis for treatment or other patient management decisions. A negative result may occur with  improper specimen collection/handling, submission of specimen other than nasopharyngeal swab, presence of viral mutation(s) within the areas targeted by this assay, and inadequate number of viral copies(<138 copies/mL). A negative result must be combined with clinical observations, patient history, and epidemiological information. The expected result is Negative.  Fact Sheet for Patients:  EntrepreneurPulse.com.au  Fact Sheet for Healthcare Providers:  IncredibleEmployment.be  This test is no t yet approved or cleared by the [place] and  has been authorized for detection and/or diagnosis of SARS-CoV-2 by FDA under an Emergency Use Authorization (EUA). This EUA will remain  in effect (meaning this test can be used) for the duration of the COVID-19 declaration under Section 564(b)(1) of the Act, 21 U.S.C.section 360bbb-3(b)(1), unless the authorization is terminated  or revoked sooner.       Influenza A by PCR NEGATIVE NEGATIVE Final   Influenza B by PCR NEGATIVE NEGATIVE Final    Comment: (NOTE) The Xpert Xpress SARS-CoV-2/FLU/RSV plus assay is intended as an aid in the diagnosis of influenza from Nasopharyngeal swab specimens and should not be used as a sole basis for treatment. Nasal washings  and aspirates are unacceptable for Xpert Xpress SARS-CoV-2/FLU/RSV testing.  Fact Sheet for Patients: EntrepreneurPulse.com.au  Fact Sheet for Healthcare Providers: IncredibleEmployment.be  This test is not yet approved or cleared by the [place] FDA and has been authorized for detection and/or diagnosis of SARS-CoV-2 by FDA under an Emergency Use Authorization (EUA). This EUA will remain in effect (meaning this test can be used) for the duration of the COVID-19 declaration under Section 564(b)(1) of the Act, 21 U.S.C. section 360bbb-3(b)(1), unless the authorization is terminated or revoked.  Performed at [facility], [address] [place], [place] [address]   Blood Culture ID Panel (Reflexed)     Status: Abnormal   Collection Time: 09/08/21  1:35 PM  Result Value Ref Range Status   Enterococcus faecalis NOT DETECTED NOT DETECTED Final   Enterococcus Faecium NOT DETECTED NOT DETECTED Final   Listeria monocytogenes NOT DETECTED NOT DETECTED Final   Staphylococcus species NOT DETECTED NOT DETECTED Final   Staphylococcus aureus (BCID) NOT DETECTED NOT DETECTED Final   Staphylococcus epidermidis NOT DETECTED NOT DETECTED Final   Staphylococcus lugdunensis NOT DETECTED NOT DETECTED Final   Streptococcus species DETECTED (A) NOT DETECTED Final    Comment: CRITICAL RESULT CALLED TO, READ BACK BY AND VERIFIED WITH: JASON ROBBINS PHARMD [id number] 09/09/21 HNM    Streptococcus agalactiae NOT DETECTED NOT DETECTED Final   Streptococcus pneumoniae DETECTED (A) NOT DETECTED Final    Comment: CRITICAL RESULT CALLED TO, READ BACK BY AND VERIFIED WITH: Violeta Gelinas PHARMD [id number] 09/09/21 HNM    Streptococcus pyogenes NOT DETECTED NOT DETECTED Final   A.calcoaceticus-baumannii NOT DETECTED NOT DETECTED Final   Bacteroides fragilis NOT DETECTED NOT DETECTED Final   Enterobacterales NOT DETECTED NOT DETECTED Final   Enterobacter cloacae complex  NOT DETECTED NOT DETECTED Final   Escherichia coli NOT DETECTED NOT DETECTED Final   Klebsiella aerogenes NOT DETECTED NOT DETECTED Final   Klebsiella oxytoca NOT DETECTED NOT DETECTED Final   Klebsiella pneumoniae NOT DETECTED NOT DETECTED Final   Proteus species NOT DETECTED NOT DETECTED Final   Salmonella species NOT DETECTED NOT DETECTED Final   Serratia marcescens NOT DETECTED NOT DETECTED Final   Haemophilus influenzae NOT DETECTED NOT DETECTED Final   Neisseria meningitidis NOT DETECTED NOT DETECTED Final   Pseudomonas aeruginosa NOT DETECTED NOT DETECTED Final   Stenotrophomonas maltophilia NOT DETECTED NOT DETECTED Final   Candida albicans NOT DETECTED NOT DETECTED Final   Candida auris NOT DETECTED NOT DETECTED Final   Candida glabrata NOT DETECTED NOT DETECTED Final   Candida krusei NOT DETECTED NOT DETECTED Final   Candida parapsilosis NOT DETECTED NOT DETECTED Final   Candida tropicalis NOT DETECTED NOT DETECTED Final   Cryptococcus neoformans/gattii NOT DETECTED NOT  DETECTED Final    Comment: Performed at [facility], [address] [place], [place] [address]  CULTURE, BLOOD (ROUTINE X 2) w Reflex to ID Panel     Status: None   Collection Time: 09/10/21  5:34 AM   Specimen: BLOOD  Result Value Ref Range Status   Specimen Description BLOOD LHAND  Final   Special Requests   Final    BOTTLES DRAWN AEROBIC AND ANAEROBIC Blood Culture adequate volume   Culture   Final    NO GROWTH 5 DAYS Performed at [facility], [address] [place], [place] [address]    Report Status 09/15/2021 FINAL  Final  CULTURE, BLOOD (ROUTINE X 2) w Reflex to ID Panel     Status: None   Collection Time: 09/10/21  5:38 AM   Specimen: BLOOD  Result Value Ref Range Status   Specimen Description BLOOD RTHAND  Final   Special Requests   Final    BOTTLES DRAWN AEROBIC AND ANAEROBIC Blood Culture adequate volume   Culture   Final    NO GROWTH 5 DAYS Performed at [facility], [address] [place], [place] [address]    Report Status 09/15/2021 FINAL  Final  SARS CORONAVIRUS 2 (TAT 6-24 HRS) Nasopharyngeal Nasopharyngeal Swab     Status: None   Collection Time: 09/13/21  3:42 PM   Specimen: Nasopharyngeal Swab  Result Value Ref Range Status   SARS Coronavirus 2 NEGATIVE NEGATIVE Final    Comment: (NOTE) SARS-CoV-2 target nucleic acids are NOT DETECTED.  The SARS-CoV-2 RNA is generally detectable in upper and lower respiratory specimens during the acute phase of infection. Negative results do not preclude SARS-CoV-2 infection, do not rule out co-infections with other pathogens, and should not be used as the sole basis for treatment or other patient management decisions. Negative results must be combined with clinical observations, patient history, and epidemiological information. The expected result is Negative.  Fact Sheet for Patients: SugarRoll.be  Fact Sheet for Healthcare Providers: https://www.woods-mathews.com/  This test is not yet approved or cleared by the [place] FDA and  has been authorized for detection and/or diagnosis of SARS-CoV-2 by FDA under an Emergency Use Authorization (EUA). This EUA will remain  in effect (meaning this test can be used) for the duration of the COVID-19 declaration under Se ction 564(b)(1) of the Act, 21 U.S.C. section 360bbb-3(b)(1), unless the authorization is terminated or revoked sooner.  Performed at [facility], [place] [address], [place], [place] [address]      Time coordinating discharge: Over 30 minutes  SIGNED:   Sidney Ace, MD  [facility] 09/17/2021, 2:19 PM Pager   If 7PM-7AM, please contact night-coverage

## 2021-09-17 NOTE — Consult Note (Signed)
[place] for warfarin  Indication: atrial fibrillation and DVT  Allergies  Allergen Reactions   Celecoxib Rash    Patient Measurements: Height: 5\' 8"  (172.7 cm) Weight: 86.1 kg (189 lb 13.1 oz) IBW/kg (Calculated) : 68.4  Vital Signs: Temp: 98.5 F (36.9 C) (12/01 0608) Temp Source: Oral (11/30 2004) BP: 142/63 (12/01 0608) Pulse Rate: 76 (12/01 0608)  Labs: Recent Labs    09/15/21 0608 09/16/21 0430 09/17/21 0502  HGB  --   --  10.5*  HCT  --   --  31.2*  PLT  --   --  264  LABPROT 24.0* 25.5* 26.4*  INR 2.1* 2.3* 2.4*    Estimated Creatinine Clearance (by C-G formula based on SCr of 1.05 mg/dL) Male: 41.7 mL/min Male: 50.9 mL/min  Medical History: Past Medical History:  Diagnosis Date   BPH (benign prostatic hyperplasia)    Chronic kidney disease    DVT (deep venous thrombosis) ([place]) 2014   right leg, IVC filter   Dysrhythmia 2019   Paroxysmal atrial fibrillation   Hypertension     Medications:  Scheduled:   furosemide  20 mg Oral Daily   irbesartan  150 mg Oral Daily   pantoprazole  40 mg Oral BID   simvastatin  20 mg Oral q1800   terazosin  5 mg Oral QHS   warfarin  5 mg Oral q1600   Warfarin - Pharmacist Dosing Inpatient   Does not apply q1600    Assessment: 87yo male with PMH of BPH, CKD, DVT, PAF, and HTN who was admitted to the hospital for cough and SOB. Patient is on warfarin for hx of DVT and PAF. Pharmacy was consulted for warfarin management.   PTA warfarin regimen: 5mg  daily (35mg  weekly)  Drug interactions: ceftriaxone > amoxicillin > ampicillin (new DDI with warfarin)  Date  INR Interpretation/dose 11/22  1.6 Subthera, 5 mg 11/23  1.7 Subthera, 5 mg 11/24  1.7 Subthera, 6 mg  11/25  1.9 Subthera, 5 mg 11/26 1.9  Subthera, 6 mg  11/27 2.2 Therapeutic, 5 mg  11/28   2.2 Therapeutic, 5mg  daily 11/29 2.1 Therapeutic, 5mg  11/30 2.3 Therapeutic 12/1 2.4 Therapeutic   Goal of Therapy:   INR 2-3 Monitor platelets by anticoagulation protocol: Yes   Plan:  INR remains therapeutic Continue warfarin 5mg  daily Continue daily INR and CBC at least every 3 days  Darnelle Bos, PharmD 09/17/2021 6:58 AM

## 2021-09-17 NOTE — TOC Progression Note (Addendum)
Transition of Care [facility]) - Progression Note    Patient Details  Name: Alexander Lane MRN: [id number] Date of Birth: 1930/07/12  Transition of Care [facility]) CM/SW [place], RN Phone Number: 09/17/2021, 9:06 AM  Clinical Narrative:   Re-sent SNF inquiry to [facility] and [facility], as their decisions are still listed as pending, Awaiting response.TOC to follow  Addendum:  As per Otila Kluver at [facility], they are able to accept patient.  Patient's wife is amenable to transfer to [facility].  Auth submitted with [place] test ordered, TOC to follow  Expected Discharge Plan: [place] Barriers to Discharge: Continued Medical Work up  Expected Discharge Plan and Services Expected Discharge Plan: [place]   Discharge Planning Services: CM Consult Post Acute Care Choice: [place] arrangements for the past 2 months: Single Family Home                 DME Arranged: N/A DME Agency: NA       HH Arranged: NA HH Agency: NA         Social Determinants of Health (SDOH) Interventions    Readmission Risk Interventions Readmission Risk Prevention Plan 09/11/2021  Transportation Screening Complete  PCP or Specialist Appt within 3-5 Days Complete  HRI or [place] Complete  Social Work Consult for [place] Planning/Counseling Complete  Palliative Care Screening Not Applicable  Medication Review Press photographer) Complete  Some recent data might be hidden

## 2021-09-30 ENCOUNTER — Other Ambulatory Visit: Payer: Self-pay

## 2021-09-30 ENCOUNTER — Non-Acute Institutional Stay: Payer: Medicare Other | Admitting: Primary Care

## 2021-09-30 VITALS — Ht 68.0 in

## 2021-09-30 DIAGNOSIS — Z515 Encounter for palliative care: Secondary | ICD-10-CM

## 2021-09-30 DIAGNOSIS — D320 Benign neoplasm of cerebral meninges: Secondary | ICD-10-CM

## 2021-09-30 DIAGNOSIS — R7881 Bacteremia: Secondary | ICD-10-CM

## 2021-09-30 DIAGNOSIS — U071 COVID-19: Secondary | ICD-10-CM

## 2021-09-30 DIAGNOSIS — I480 Paroxysmal atrial fibrillation: Secondary | ICD-10-CM

## 2021-09-30 NOTE — Progress Notes (Signed)
[facility] [facility] Palliative Care Consult Note Telephone: [phone]  Fax: [phone]   Date of encounter: 09/30/21 12:27 PM PATIENT NAME: Alexander Lane [place] [id number]   [phone] (home)  DOB: 11/21/1929 MRN: [id number] PRIMARY CARE PROVIDER:    Rusty Aus, MD,  [place] [address] [phone]  REFERRING PROVIDER:  Cephas Darby, [address] [place],  [place] [address] [phone]  RESPONSIBLE PARTY:    Contact Information     Name Relation Home Work Mobile   NACHUM, DEROSSETT [phone]  [phone]   [place] Daughter   [phone]       I met face to face with patient in [facility] facility. Palliative Care was asked to follow this patient by consultation request of Cephas Darby, FNP   to address advance care planning and complex medical decision making. This is the initial visit.                                     ASSESSMENT AND PLAN / RECOMMENDATIONS:   Advance Care Planning/Goals of Care: Goals include to maximize quality of life and symptom management. Patient/health care surrogate gave his/her permission to discuss.Our advance care planning conversation included a discussion about:     Exploration of personal, cultural or spiritual beliefs that might influence medical decisions  Exploration of goals of care in the event of a sudden injury or illness  Identification  of a healthcare agent - wife CODE STATUS: tbd MOST on file in SNF chart with DNR and comfort measures, but not signed by representative. Will f/u with wife when we connect.   Symptom Management/Plan:  Patient has been in snf for rehab and anticipates going home. Will need supportive care in community as he has meningioma which will increase in size and will worsen in symptoms.  [facility] had encouraged PC consult. Reached out to wife who has told SNF staff she  desires consultation with PC. No answer, message left. Will continue to follow in community per request.  Patient states he is feeling well, eating well. Needs help with mobility. Very much would like to go home.   Follow up Palliative Care Visit: Palliative care will continue to follow for complex medical decision making, advance care planning, and clarification of goals. Return 3-4 weeks or prn.  I spent 35 minutes providing this consultation. More than 50% of the time in this consultation was spent in counseling and care coordination.  PPS: 40%  HOSPICE ELIGIBILITY/DIAGNOSIS: TBD  Chief Complaint: debility  HISTORY OF PRESENT ILLNESS:  Alexander Lane is a 87 y.o. year old adult  with h/o a fib, cerebral meningioma increasing in size, debility and immobility. Was in hospital for pneumonia and sepsis, then to SNF for rehab. Has cough still but no sob at rest..   History obtained from review of EMR, discussion with primary team, and interview with family, facility staff/caregiver and/or Mr. Moxey.  I reviewed available labs, medications, imaging, studies and related documents from the EMR.  Records reviewed and summarized above.   ROS   General: NAD EYES: denies vision changes ENMT: denies dysphagia Cardiovascular: denies chest pain, denies DOE Pulmonary: endorses cough, denies increased SOB Abdomen: endorses good appetite, denies constipation, endorses incontinence of bowel GU: denies dysuria, endorses incontinence of urine MSK:   endorses  weakness,  no falls  reported Skin: denies rashes or wounds Neurological: denies pain, denies insomnia Psych: Endorses positive mood Heme/lymph/immuno: denies bruises, abnormal bleeding  Physical Exam: Current and past weights: 185 lbs stable Constitutional: NAD General: frail appearing, wnwd EYES: anicteric sclera, lids intact, no discharge  ENMT: intact hearing, oral mucous membranes moist, dentition intact CV: S1S2, RRR, slight  LE  edema Pulmonary: LCTA, no increased work of breathing, no cough, room air Abdomen: intake 75-100%, normo-active BS + 4 quadrants, soft and non tender, no ascites MSK: mild sarcopenia, moves all extremities, ambulatory with walker and stand by Skin: warm and dry, no rashes or wounds on visible skin Neuro:  + generalized weakness,  mild  cognitive impairment CN 2-12 grossly intact today Psych: non-anxious affect, A and O x 2 Hem/lymph/immuno: no widespread bruising  CURRENT PROBLEM LIST:  Patient Active Problem List   Diagnosis Date Noted   Bacteremia due to Gram-positive bacteria 09/09/2021   CAP (community acquired pneumonia) 09/08/2021   Severe sepsis ([place]) 09/08/2021   Physical deconditioning 09/08/2021   Acute respiratory failure with hypoxia ([place]) 03/18/2021   COVID-19 virus infection 03/08/2021   Elevated CK 03/08/2021   BPH (benign prostatic hyperplasia)    Acute renal failure superimposed on stage 3a chronic kidney disease (HCC)    Hypertension    PAF (paroxysmal atrial fibrillation) (HCC)    Meningioma, cerebral (HCC)    Hypertensive urgency    Fall at home, initial encounter    GERD (gastroesophageal reflux disease)    HLD (hyperlipidemia)    DVT (deep venous thrombosis) ([place]) 2014   PAST MEDICAL HISTORY:  Active Ambulatory Problems    Diagnosis Date Noted   COVID-19 virus infection 03/08/2021   BPH (benign prostatic hyperplasia)    Acute renal failure superimposed on stage 3a chronic kidney disease ([place])    DVT (deep venous thrombosis) ([place]) 2014   Hypertension    PAF (paroxysmal atrial fibrillation) (HCC)    Meningioma, cerebral (HCC)    Hypertensive urgency    Fall at home, initial encounter    GERD (gastroesophageal reflux disease)    HLD (hyperlipidemia)    Elevated CK 03/08/2021   Acute respiratory failure with hypoxia ([place]) 03/18/2021   CAP (community acquired pneumonia) 09/08/2021   Severe sepsis ([place]) 09/08/2021   Physical deconditioning 09/08/2021    Bacteremia due to Gram-positive bacteria 09/09/2021   Resolved Ambulatory Problems    Diagnosis Date Noted   Acute metabolic encephalopathy    Past Medical History:  Diagnosis Date   Chronic kidney disease    Dysrhythmia 2019   SOCIAL HX:  Social History   Tobacco Use   Smoking status: Former   Smokeless tobacco: Never  Substance Use Topics   Alcohol use: Never   FAMILY HX:  Family History  Problem Relation Age of Onset   Melanoma Brother       ALLERGIES:  Allergies  Allergen Reactions   Celecoxib Rash     PERTINENT MEDICATIONS:  Outpatient Encounter Medications as of 09/30/2021  Medication Sig   acetaminophen (TYLENOL) 500 MG tablet Take 500-1,000 mg by mouth every 6 (six) hours as needed for mild pain or moderate pain.   amLODipine (NORVASC) 2.5 MG tablet Take 2.5 mg by mouth at bedtime.   furosemide (LASIX) 20 MG tablet Take 20 mg by mouth daily.   levETIRAcetam (KEPPRA) 250 MG tablet Take 1 tablet (250 mg total) by mouth 2 (two) times daily.   olmesartan (BENICAR) 20 MG tablet Take 20 mg by mouth daily.  pantoprazole (PROTONIX) 40 MG tablet Take 40 mg by mouth 2 (two) times daily.   simvastatin (ZOCOR) 20 MG tablet Take 20 mg by mouth at bedtime.   terazosin (HYTRIN) 5 MG capsule Take 5 mg by mouth at bedtime.   warfarin (COUMADIN) 5 MG tablet Take 5 mg by mouth every evening.   No facility-administered encounter medications on file as of 09/30/2021.   Thank you for the opportunity to participate in the care of Mr. Soden.  The palliative care team will continue to follow. Please call our office at [phone] if we can be of additional assistance.   Jason Coop, NP , DNP, AGPCNP-BC  COVID-19 PATIENT SCREENING TOOL Asked and negative response unless otherwise noted:  Have you had symptoms of covid, tested positive or been in contact with someone with symptoms/positive test in the past 5-10 days?

## 2021-10-06 ENCOUNTER — Telehealth: Payer: Self-pay | Admitting: Student

## 2021-10-06 NOTE — Telephone Encounter (Signed)
Spoke with patient's wife [name], regarding In-home Palliative referral/services and all questions were answered and she was in agreement with scheduling visit.  I have scheduled an In-home Consult for 10/15/21 @ 12:30 PM

## 2021-10-15 ENCOUNTER — Other Ambulatory Visit: Payer: Medicare Other | Admitting: Student

## 2021-10-15 ENCOUNTER — Other Ambulatory Visit: Payer: Self-pay

## 2021-10-15 DIAGNOSIS — I824Y9 Acute embolism and thrombosis of unspecified deep veins of unspecified proximal lower extremity: Secondary | ICD-10-CM

## 2021-10-15 DIAGNOSIS — R059 Cough, unspecified: Secondary | ICD-10-CM

## 2021-10-15 DIAGNOSIS — R531 Weakness: Secondary | ICD-10-CM

## 2021-10-15 DIAGNOSIS — D320 Benign neoplasm of cerebral meninges: Secondary | ICD-10-CM

## 2021-10-15 DIAGNOSIS — Z9189 Other specified personal risk factors, not elsewhere classified: Secondary | ICD-10-CM

## 2021-10-15 DIAGNOSIS — Z515 Encounter for palliative care: Secondary | ICD-10-CM

## 2021-10-15 NOTE — Progress Notes (Signed)
[facility] Palliative Care Consult Note Telephone: [phone]  Fax: [phone]    Date of encounter: 10/15/21 5:06 PM PATIENT NAME: Alexander Lane [id number]   [phone] (home)  DOB: 1930-08-21 MRN: [id number] PRIMARY CARE PROVIDER:    Rusty Aus, MD,  [place] [address] [phone]  REFERRING PROVIDER:   Rusty Aus, MD [facility] [place],  [place] [address] [phone]  RESPONSIBLE PARTY:    Contact Information     Name Relation Home Work Mobile   SHANN, MERRICK [phone]  [phone]   Lynder Parents Daughter   [phone]        I met face to face with patient and family in the home. Palliative Care was asked to follow this patient by consultation request of  Rusty Aus, MD to address advance care planning and complex medical decision making. This is a follow up visit.                                   ASSESSMENT AND PLAN / RECOMMENDATIONS:   Advance Care Planning/Goals of Care: Goals include to maximize quality of life and symptom management. Our advance care planning conversation included a discussion about:    The value and importance of advance care planning  Experiences with loved ones who have been seriously ill or have died  Exploration of personal, cultural or spiritual beliefs that might influence medical decisions  Exploration of goals of care in the event of a sudden injury or illness  Daughter [name] Kalman Shan has healthcare power of attorney. Re reviewed MOST form; family would like to discuss further to [place] with patient's wishes designated on his living will. CODE STATUS: Full Code  Patient would like to remain in the home.  Patient would like to complete therapy as directed.  Patient will need additional support in the home.  Family is given resources on  in-home caregivers.  Palliative medicine will continue to provide supportive care.  Patient having increased difficulty getting out of the home.  Will check to see if home health nurse can be added regarding Coumadin/warfarin checks.  Discussed in-home device to check INR.  We also discussed other options; wife expresses cost being a factor on switching to another agent.  Symptom Management/Plan:  Generalized weakness-continue therapy as directed. Use walker for ambulation. Family would like to see if patient can get bath aid. Also discussed private caregivers in the home for additional support. Monitor for falls/safety.   Cerebral meningioma-patient to continue on Keppra; refill sent to pharmacy. Monitor for worsening symptoms; provide supportive care.   DVT-currently on coumadin. Will check with [place] to see if SN can be added for INR checks.   Cough-secondary to recent pneumonia. Mucinex 600 mg BID.   Risk for aspiration-patient would benefit from ST; ST to evaluate and treat as directed due to aspiration. Education provided on softer foods, aspiration precautions.   Follow up Palliative Care Visit: Palliative care will continue to follow for complex medical decision making, advance care planning, and clarification of goals. Return 4-6 weeks or prn.  I spent 60 minutes providing this consultation. More than 50% of the time in this consultation was spent in counseling and care coordination.   PPS: 40%  HOSPICE ELIGIBILITY/DIAGNOSIS: TBD  Chief Complaint: Palliative medicine follow-up visit  HISTORY OF PRESENT ILLNESS:  Alexander Lane is a 87 y.o. year old adult  with cerebral meningioma, paroxysmal atrial fibrillation, hypertension, hyperlipidemia, BPH, debility, DVT-on Coumadin, CKD 3.  Hospitalized 11/ 22-09/17/21 due to pneumonia, sepsis; during hospitalization meningioma found to have increased in size per MRI on 09/15/21. Wife states it was decided to have no further  intervention regarding cerebral meningioma.  Patient is at home, status post SNF stay. He is receiving HH PT due to debility, weakness. Patient reports still having cough. Patient denies any headaches, vision changes, hearing loss, seizures. There is a concern that patient is aspirating. Wife expresses need for additional support in the home. Also would like to see if he can get a bath aid with home health that is coming out currently. He has not had a warfarin check in over a month. He is having increased difficulty getting out of the home. Family would like for patient to remain in the home. A 10-point review of systems is negative, except for the pertinent positives and negatives detailed in the HPI.   History obtained from review of EMR, discussion with primary team, and interview with family, facility staff/caregiver and/or Mr. Estelle.  I reviewed available labs, medications, imaging, studies and related documents from the EMR.  Records reviewed and summarized above.    Physical Exam: Pulse 78, resp 16, b/p 112/60, sats 98% on room air Constitutional: NAD General: frail appearing EYES: anicteric sclera, lids intact, no discharge  ENMT: intact hearing, oral mucous membranes moist, dentition intact CV: S1S2, RRR, no LE edema Pulmonary: no adventitious lung sounds, slightly diminished bilaterally, no increased work of breathing, occasional cough, room air Abdomen: normo-active BS + 4 quadrants, soft and non tender, no ascites GU: deferred YQM:GNOIB all extremities, ambulatory with walker Skin: warm and dry, no rashes or wounds on visible skin Neuro: generalized weakness, A & O x 3 Psych: non-anxious affect Hem/lymph/immuno: no widespread bruising   Thank you for the opportunity to participate in the care of [name].  The palliative care team will continue to follow. Please call our office at [phone] if we can be of additional assistance.   Ezekiel Slocumb, NP   COVID-19  PATIENT SCREENING TOOL Asked and negative response unless otherwise noted:   Have you had symptoms of covid, tested positive or been in contact with someone with symptoms/positive test in the past 5-10 days? No

## 2021-10-21 ENCOUNTER — Telehealth: Payer: Self-pay

## 2021-10-21 NOTE — Telephone Encounter (Signed)
NP order to add on SN, [place] and ST services with documentation faxed to [facility].

## 2021-10-22 ENCOUNTER — Telehealth: Payer: Self-pay

## 2021-10-22 NOTE — Telephone Encounter (Signed)
1045 am.  Request received for PT/INR check from [facility], NP for Palliative Care.  Phone call made to patient to schedule a home visit for tomorrow.  Patient is agreeable to 1230 pm.

## 2021-10-23 ENCOUNTER — Other Ambulatory Visit: Payer: Medicare Other

## 2021-10-23 ENCOUNTER — Other Ambulatory Visit: Payer: Self-pay

## 2021-10-23 DIAGNOSIS — Z515 Encounter for palliative care: Secondary | ICD-10-CM

## 2021-10-23 NOTE — Progress Notes (Addendum)
1240 pm.  Request from [facility], NP to obtain a PT/INR on patient as he has not been able to see his PCP due to limited mobility.  INR check completed today.  INR 2.7 and PT 28.7 sec.  Patient continues on Coumdain 5 mg daily with no doses missed. Spoke with Olivia Mackie with PCP office and results provided.  Advised patient that PCP would either contact this writer or him with any adjustments.  No other concerns voiced at this time.   1255 pm.  Message received from [place] with [facility].  Home Health Aide services will not be provided to patient.  Belva Chimes states all of other services will be provided with therapy and nursing.  Advised that I had completed the PT/INR.  Results provided and advised that I have contacted PCP with results and waiting for call back.  Belva Chimes stated if additional testing was needed to please let them know.  Update provided to [facility], NP.

## 2021-10-26 ENCOUNTER — Telehealth: Payer: Self-pay

## 2021-10-26 NOTE — Telephone Encounter (Signed)
256 pm.  Phone call made to Jordana with [facility].  Advised of PT/INR recheck in 1 month.  If team is still in place they are able to obtain INR in the home.  Spoke with wife and if patient is able to travel to PCP office she will transport him for blood work to be done.  If patient is unable to go to PCP office, either [place] or PC is able to obtain INR.  Wife will let us know if she is needing assistance with this.

## 2021-11-10 ENCOUNTER — Other Ambulatory Visit: Payer: Self-pay

## 2021-11-10 ENCOUNTER — Other Ambulatory Visit: Payer: Medicare Other

## 2021-11-10 DIAGNOSIS — Z515 Encounter for palliative care: Secondary | ICD-10-CM

## 2021-11-12 NOTE — Progress Notes (Addendum)
PATIENT NAME: Alexander Lane DOB: 06/28/1930 MRN: [id number]  PRIMARY CARE PROVIDER: Rusty Aus, MD  RESPONSIBLE PARTY:  Acct ID - Guarantor Home Phone Work Phone Relationship Acct Type  [id number] REEF, [phone]  Self P/F     [address], [place], [place] [address]  COMMUNITY PALLIATIVE CARE RN NOTE  PRIMARY CARE PROVIDER: Dr. Emily Filbert RESPONSIBLE PARTY: Theressa Stamps  PLAN OF CARE and INTERVENTION:  ADVANCE CARE PLANNING/GOALS OF CARE: Goal is to maintain comfort, safety, remain in home PATIENT/CAREGIVER EDUCATION: Palliative Care services, provided contact information, encouraged to call with questions and concerns. DISEASE STATUS: RN Palliative care telephonic encounter completed. Spoke with patient's wife, Alexander Lane. Declined need for in person visit. Alexander Lane shared that patient has been doing somewhat better. Patient displays no evidence of pain or shortness of breath currently. Although, since COVID Dx and subsequent pneumonia and hospitalization, patient has not returned to baseline. He continues to receive home health services- OT.PT and SN. Patient has an appointment with his PCP this week. Alexander Lane requested a phone call in about a month. Encouraged Alexander Lane to call with any concerns.   HISTORY OF PRESENT ILLNESS: This is an 88 year old male residing at home with his wife. Diagnosis includes but not be limited to Cerebral Meningioma, A-fib, Hypertension, DVT- on coumadin.  Palliative care team to continue to follow to support with goals of care and complex decision making.  CODE STATUS: DNR MOST FORM: No PPS: 40%  PHYSICAL EXAM: Deferred     (Duration of Visit and documentation: 35 minutes)     Cornelius Moras, RN

## 2021-11-16 ENCOUNTER — Other Ambulatory Visit: Payer: Self-pay

## 2021-11-16 ENCOUNTER — Emergency Department: Payer: Medicare Other

## 2021-11-16 ENCOUNTER — Encounter: Payer: Self-pay | Admitting: Emergency Medicine

## 2021-11-16 ENCOUNTER — Emergency Department
Admission: EM | Admit: 2021-11-16 | Discharge: 2021-11-16 | Disposition: A | Discharge status: Home / Self Care | Payer: Medicare Other | Attending: Emergency Medicine | Admitting: Emergency Medicine

## 2021-11-16 DIAGNOSIS — S61412A Laceration without foreign body of left hand, initial encounter: Secondary | ICD-10-CM | POA: Diagnosis not present

## 2021-11-16 DIAGNOSIS — I480 Paroxysmal atrial fibrillation: Secondary | ICD-10-CM | POA: Insufficient documentation

## 2021-11-16 DIAGNOSIS — S0083XA Contusion of other part of head, initial encounter: Secondary | ICD-10-CM | POA: Insufficient documentation

## 2021-11-16 DIAGNOSIS — W19XXXA Unspecified fall, initial encounter: Secondary | ICD-10-CM

## 2021-11-16 DIAGNOSIS — Y9301 Activity, walking, marching and hiking: Secondary | ICD-10-CM | POA: Diagnosis not present

## 2021-11-16 DIAGNOSIS — Z8616 Personal history of COVID-19: Secondary | ICD-10-CM | POA: Insufficient documentation

## 2021-11-16 DIAGNOSIS — W01198A Fall on same level from slipping, tripping and stumbling with subsequent striking against other object, initial encounter: Secondary | ICD-10-CM | POA: Diagnosis not present

## 2021-11-16 DIAGNOSIS — Z23 Encounter for immunization: Secondary | ICD-10-CM | POA: Insufficient documentation

## 2021-11-16 DIAGNOSIS — N189 Chronic kidney disease, unspecified: Secondary | ICD-10-CM | POA: Insufficient documentation

## 2021-11-16 DIAGNOSIS — S0990XA Unspecified injury of head, initial encounter: Secondary | ICD-10-CM

## 2021-11-16 DIAGNOSIS — Z7901 Long term (current) use of anticoagulants: Secondary | ICD-10-CM | POA: Insufficient documentation

## 2021-11-16 DIAGNOSIS — S6991XA Unspecified injury of right wrist, hand and finger(s), initial encounter: Secondary | ICD-10-CM | POA: Diagnosis present

## 2021-11-16 DIAGNOSIS — S61411A Laceration without foreign body of right hand, initial encounter: Secondary | ICD-10-CM | POA: Insufficient documentation

## 2021-11-16 DIAGNOSIS — I129 Hypertensive chronic kidney disease with stage 1 through stage 4 chronic kidney disease, or unspecified chronic kidney disease: Secondary | ICD-10-CM | POA: Insufficient documentation

## 2021-11-16 LAB — CBC WITH DIFFERENTIAL/PLATELET
Abs Immature Granulocytes: 0.01 10*3/uL (ref 0.00–0.07)
Basophils Absolute: 0.1 10*3/uL (ref 0.0–0.1)
Basophils Relative: 1 %
Eosinophils Absolute: 0.2 10*3/uL (ref 0.0–0.5)
Eosinophils Relative: 3 %
HCT: 34.4 % — ABNORMAL LOW (ref 39.0–52.0)
Hemoglobin: 11.2 g/dL — ABNORMAL LOW (ref 13.0–17.0)
Immature Granulocytes: 0 %
Lymphocytes Relative: 19 %
Lymphs Abs: 1.3 10*3/uL (ref 0.7–4.0)
MCH: 30 pg (ref 26.0–34.0)
MCHC: 32.6 g/dL (ref 30.0–36.0)
MCV: 92.2 fL (ref 80.0–100.0)
Monocytes Absolute: 0.4 10*3/uL (ref 0.1–1.0)
Monocytes Relative: 7 %
Neutro Abs: 4.8 10*3/uL (ref 1.7–7.7)
Neutrophils Relative %: 70 %
Platelets: 246 10*3/uL (ref 150–400)
RBC: 3.73 MIL/uL — ABNORMAL LOW (ref 4.22–5.81)
RDW: 13.4 % (ref 11.5–15.5)
WBC: 6.8 10*3/uL (ref 4.0–10.5)
nRBC: 0 % (ref 0.0–0.2)

## 2021-11-16 LAB — COMPREHENSIVE METABOLIC PANEL
ALT: 8 U/L (ref 0–44)
AST: 16 U/L (ref 15–41)
Albumin: 3.6 g/dL (ref 3.5–5.0)
Alkaline Phosphatase: 62 U/L (ref 38–126)
Anion gap: 7 (ref 5–15)
BUN: 29 mg/dL — ABNORMAL HIGH (ref 8–23)
CO2: 29 mmol/L (ref 22–32)
Calcium: 8.9 mg/dL (ref 8.9–10.3)
Chloride: 104 mmol/L (ref 98–111)
Creatinine, Ser: 1.81 mg/dL — ABNORMAL HIGH (ref 0.61–1.24)
GFR, Estimated: 35 mL/min — ABNORMAL LOW (ref >60)
Glucose, Bld: 110 mg/dL — ABNORMAL HIGH (ref 70–99)
Potassium: 3.9 mmol/L (ref 3.5–5.1)
Sodium: 140 mmol/L (ref 135–145)
Total Bilirubin: 0.6 mg/dL (ref 0.3–1.2)
Total Protein: 6.1 g/dL — ABNORMAL LOW (ref 6.5–8.1)

## 2021-11-16 LAB — PROTIME-INR
INR: 1.8 — ABNORMAL HIGH (ref 0.8–1.2)
Prothrombin Time: 20.6 seconds — ABNORMAL HIGH (ref 11.4–15.2)

## 2021-11-16 MED ORDER — TETANUS-DIPHTH-ACELL PERTUSSIS 5-2.5-18.5 LF-MCG/0.5 IM SUSY
0.5000 mL | PREFILLED_SYRINGE | Freq: Once | INTRAMUSCULAR | Status: AC
  Administered 2021-11-16: 0.5 mL via INTRAMUSCULAR
  Filled 2021-11-16: qty 0.5

## 2021-11-16 NOTE — Discharge Instructions (Addendum)
Your CAT scans did not show any traumatic injury to your head or neck.  Your blood work was overall reassuring but your INR was 1.8, this is mildly subtherapeutic, please follow-up with your primary care provider regarding your warfarin dosing.  You can apply bacitracin to your skin tears.

## 2021-11-16 NOTE — ED Triage Notes (Signed)
Pt ems from home s/p fall. Pt with skin tears on bilateral top of hands, forehead, bleeding controlled. Per pt no other injuries.

## 2021-11-16 NOTE — ED Provider Notes (Signed)
[facility] Provider Note    Event Date/Time   First MD Initiated Contact with Patient 11/16/21 1302     (approximate)   History   Fall   HPI  Alexander Lane is a 88 y.o. adult  with pmh DVT, paroxysmal atrial fibrillation on Coumadin, CKD, known meningioma who presents after a fall.  Patient tells me he was walking to the mailbox when he fell.  He does not think he lost consciousness but is not able to tell me exactly why he fell.  Did not have any preceding lightheadedness palpitations shortness of breath or chest pain.  He hit his head.  He currently denies any pain and feels back to baseline.  He has chronic right-sided lower extremity edema laded to his prior DVT.     Past Medical History:  Diagnosis Date   BPH (benign prostatic hyperplasia)    Chronic kidney disease    DVT (deep venous thrombosis) ([place]) 2014   right leg, IVC filter   Dysrhythmia 2019   Paroxysmal atrial fibrillation   Hypertension     Patient Active Problem List   Diagnosis Date Noted   Bacteremia due to Gram-positive bacteria 09/09/2021   CAP (community acquired pneumonia) 09/08/2021   Severe sepsis ([place]) 09/08/2021   Physical deconditioning 09/08/2021   Acute respiratory failure with hypoxia ([place]) 03/18/2021   COVID-19 virus infection 03/08/2021   Elevated CK 03/08/2021   BPH (benign prostatic hyperplasia)    Acute renal failure superimposed on stage 3a chronic kidney disease (HCC)    Hypertension    PAF (paroxysmal atrial fibrillation) ([place])    Meningioma, cerebral ([place])    Hypertensive urgency    Fall at home, initial encounter    GERD (gastroesophageal reflux disease)    HLD (hyperlipidemia)    DVT (deep venous thrombosis) ([place]) 2014     Physical Exam  Triage Vital Signs: ED Triage Vitals  Enc Vitals Group     BP 11/16/21 1309 139/67     Pulse Rate 11/16/21 1309 79     Resp 11/16/21 1309 18     Temp 11/16/21 1309 97.7 F (36.5 C)     Temp Source  11/16/21 1309 Oral     SpO2 11/16/21 1309 100 %     Weight 11/16/21 1321 166 lb 10.7 oz (75.6 kg)     Height --      Head Circumference --      [place] Flow --      Pain Score 11/16/21 1310 3     Pain Loc --      Pain Edu? --      Excl. in [place]? --     Most recent vital signs: Vitals:   11/16/21 1309  BP: 139/67  Pulse: 79  Resp: 18  Temp: 97.7 F (36.5 C)  SpO2: 100%     General: Awake, no distress.  Appears chronically ill CV:  Good peripheral perfusion.  Resp:  Normal effort.  Abd:  No distention.  Soft and nontender Neuro:             Awake, Alert, Oriented x 3  Other:  Abrasion over the left forehead with some surrounding ecchymosis and swelling, no midface tenderness or instability, PERRLA, EOMI Skin tears over the bilateral dorsal surfaces of the hands No focal bony tenderness or swelling of the bilateral upper or lower extremities Pelvis is stable, able to range the hips bilaterally   ED Results / Procedures / Treatments  Labs (all labs ordered are listed, but only abnormal results are displayed) Labs Reviewed  CBC WITH DIFFERENTIAL/PLATELET - Abnormal; Notable for the following components:      Result Value   RBC 3.73 (*)    Hemoglobin 11.2 (*)    HCT 34.4 (*)    All other components within normal limits  COMPREHENSIVE METABOLIC PANEL - Abnormal; Notable for the following components:   Glucose, Bld 110 (*)    BUN 29 (*)    Creatinine, Ser 1.81 (*)    Total Protein 6.1 (*)    GFR, Estimated 35 (*)    All other components within normal limits  PROTIME-INR - Abnormal; Notable for the following components:   Prothrombin Time 20.6 (*)    INR 1.8 (*)    All other components within normal limits     EKG  EKG interpretation performed by myself: NSR, nml axis, nml intervals, no acute ischemic changes    RADIOLOGY I reviewed the CT scan of the brain which does not show any acute intracranial process; agree with radiology report   I reviewed the CT of the  cervical spine which does not show any acute fracture or misalignment; agree with radiology report     PROCEDURES:  Critical Care performed: No  Procedures  The patient is on the cardiac monitor to evaluate for evidence of arrhythmia and/or significant heart rate changes.   MEDICATIONS ORDERED IN ED: Medications  Tdap (BOOSTRIX) injection 0.5 mL (0.5 mLs Intramuscular Given 11/16/21 1347)     IMPRESSION / MDM / ASSESSMENT AND PLAN / ED COURSE  I reviewed the triage vital signs and the nursing notes.                              Differential diagnosis includes, but is not limited to, traumatic intracranial hemorrhage, scalp hematoma, syncope, mechanical fall   88 year old male on Coumadin who presents after a fall.  Somewhat unclear if this was syncope versus mechanical fall as he cannot really describe likely why he fell although he denies any lightheadedness or loss of consciousness.  He has an abrasion and ecchymosis on the forehead without any other midface swelling or tenderness.  No C-spine tenderness no chest wall or abdominal or extremity tenderness.  [place] was updated.  INR obtained which is 1.8.  CT of the head and neck did not show any acute traumatic findings.  I did obtain basic labs including a CBC and CMP given somewhat unclear whether he had true syncope and these were all reassuring.  His creatinine is mildly increased from baseline at 1.8.  I reviewed his EKG which has no acute concerning changes.  Given patient feels back to baseline has no injuries I think he is stable for discharge.   FINAL CLINICAL IMPRESSION(S) / ED DIAGNOSES   Final diagnoses:  Fall, initial encounter  Injury of head, initial encounter     Rx / DC Orders   ED Discharge Orders     None        Note:  This document was prepared using Dragon voice recognition software and may include unintentional dictation errors.   Rada Hay, MD 11/16/21 [phone]

## 2021-11-17 ENCOUNTER — Other Ambulatory Visit: Payer: Medicare Other | Admitting: Student

## 2021-11-23 ENCOUNTER — Other Ambulatory Visit: Payer: Medicare Other

## 2021-11-23 ENCOUNTER — Other Ambulatory Visit: Payer: Self-pay

## 2021-11-23 DIAGNOSIS — Z515 Encounter for palliative care: Secondary | ICD-10-CM

## 2021-11-24 NOTE — Progress Notes (Signed)
PATIENT NAME: Alexander Lane DOB: 04/14/1930 MRN: [id number]  PRIMARY CARE PROVIDER: Rusty Aus, MD  RESPONSIBLE PARTY:  Acct ID - Guarantor Home Phone Work Phone Relationship Acct Type  [id number] MAKEL, [id number]  Self P/F     [address], [place], [place] [address]    Due to the COVID-19 crisis, this visit was done via telemedicine from my office and it was initiated and consent by this patient and or family.  I connected with  Orlene Erm OR PROXY on 11/24/21 by telephone and verified that I am speaking with the correct person using two identifiers.   I discussed the limitations of evaluation and management by telemedicine. The patient expressed understanding and agreed to proceed.    Connected with Mrs.Wolaver by telephone to complete a check-in call.  Patient will follow up with PCP this week and have PT/INR completed.  Per wife, nursing continues to come out with home health but patient has not been able to participate in therapy.  She feels patient is weaker and has declined since my last visit in January.  We discussed a home visit for next week.  I will contact wife again at the end of the week to schedule a visit for next week.    Charlann Boxer, NP updated.     Lorenza Burton, RN

## 2021-12-02 ENCOUNTER — Other Ambulatory Visit: Payer: Medicare Other

## 2021-12-02 ENCOUNTER — Other Ambulatory Visit: Payer: Self-pay

## 2021-12-02 DIAGNOSIS — Z515 Encounter for palliative care: Secondary | ICD-10-CM

## 2021-12-07 NOTE — Progress Notes (Signed)
PATIENT NAME: Alexander Lane DOB: 29-Mar-1930 MRN: [id number]  PRIMARY CARE PROVIDER: Rusty Aus, MD  RESPONSIBLE PARTY:  Acct ID - Guarantor Home Phone Work Phone Relationship Acct Type  [id number] GIBBS, [phone]  Self P/F     [address] [place],  [address]  VIRTUAL CHECK-IN VISIT  STATEMENT Due to the COVID-19 crisis, this virtual check-in visit was done via telephone from my office and it was initiated and consent by this patient and or family. I connected with  [employer] by telephone and verified that I am speaking with the correct person using two identifiers.   I discussed the limitations of evaluation and management by telemedicine. The patient expressed understanding and agreed to proceed.       Spoke with [name] to check in on patient. She stated that patient has been doing a little better and home health plans on staying in for a while longer. Patient does not demonstrate any evidence of pain or shortness of breath. She declines need for a visit from Palliative care at this time. Encouraged patient to call with any questions or concerns.  Cornelius Moras, RN

## 2022-02-16 ENCOUNTER — Other Ambulatory Visit: Payer: Medicare Other

## 2022-02-16 DIAGNOSIS — Z515 Encounter for palliative care: Secondary | ICD-10-CM

## 2022-02-16 NOTE — Progress Notes (Signed)
PATIENT NAME: Alexander Lane ?DOB: 02-Feb-1930 ?MRN: [id number] ? ?PRIMARY CARE PROVIDER: Rusty Aus, MD ? ?RESPONSIBLE PARTY:  ?Acct ID - Guarantor Home Phone Work Phone Relationship Acct Type  ?[id number] ATHEL, MERRIWEATHER* [phone]  Self P/F  ?   [address] [place], [place] [address]  ? ?I connected with Shirley-wife of  NATION CRADLE on 02/16/22 by telephone and verified that I am speaking with the correct person using two identifiers. ?  ?I discussed the limitations of evaluation and management by telemedicine. The patient expressed understanding and agreed to proceed.  ? ?Debility:  Wife endorses this has improved.  Home Health is no longer involved.  Patient is using a walker for safe ambulation and is moving around the home well.  Now able to go to [facility] for INR checks.   ? ?Incontinence:  Wife endorses urinary incontinence has been difficult to manage.  Patient is wearing depends now. ? ?Respite:  Wife is in need of respite care.  Her desire is to have a person stay with the patient so she may leave the home for a few days to rest.  We discussed agencies like [facility] and estimated hourly cost.  I will also have [place] contact wife and provide additional information.  ? ? ?Update provided to [facility], NP.  ? ? ? ? ? ? ?Lorenza Burton, RN ? ?

## 2022-02-17 NOTE — Progress Notes (Signed)
PC SW spoke with patients spouse, Alexander Lane, to f/u with her post PC RN discussion. ? ?Spouse shared that she was interested in respite care services for patient as she would like to take trip soon.  ? ?SW and spouse discussed in home care agencies vs SNF respite options, as she shared patient would require 24/7 care rather than just time or night time care. ? ?SW advised that SW will mail options for in home care agencies that can offer overnight caregivers such as [place], [place] instead or [facility] [employer]  ?As well as local SNF's such as [facility], [facility] or [facility] that may offer respite stay so that she can compare services and prices. ? ?Spouse appreciative of feedback. Spouse has no other questions/concerns at this time for SW. ?

## 2022-04-11 IMAGING — CT CT HEAD W/O CM
4 series · 15 of 47 positions shown, 17 images · non-contrast
Comparison: Head CT 09/08/2021, head MRI 09/15/2021, cervical spine
CT 01/21/2020, and cervical spine MRI 01/22/2020.

CLINICAL DATA: Head and neck trauma.  Fall.



[Series 2: head wo · axial · 0.44mm/px · z∈[-181,-61]mm · 7 of 33 slices shown, 9 images]
[im 5/33  brain]
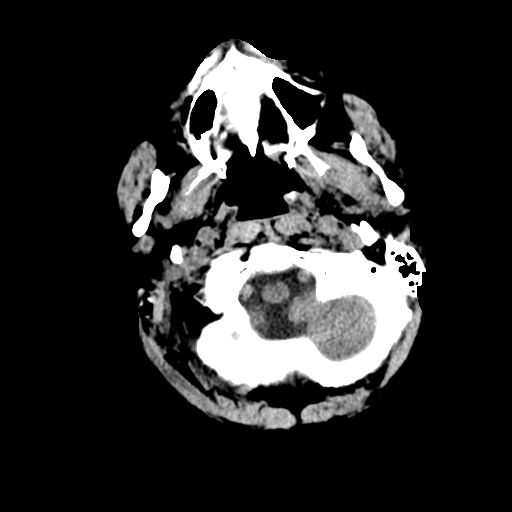
[im 5/33  bone]
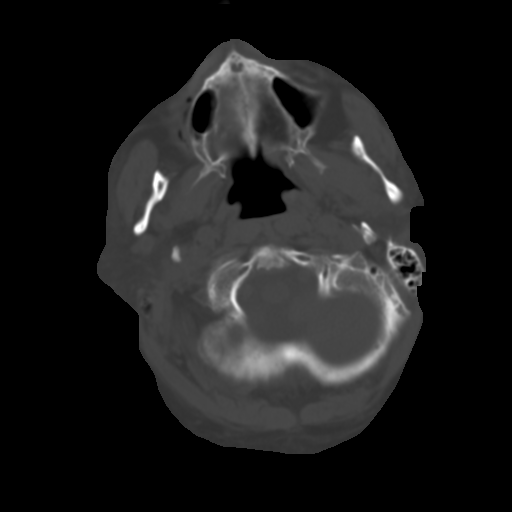
[im 9/33  brain]
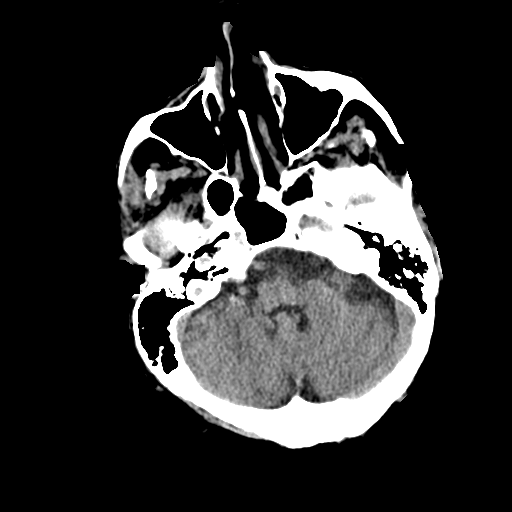
[im 13/33  brain]
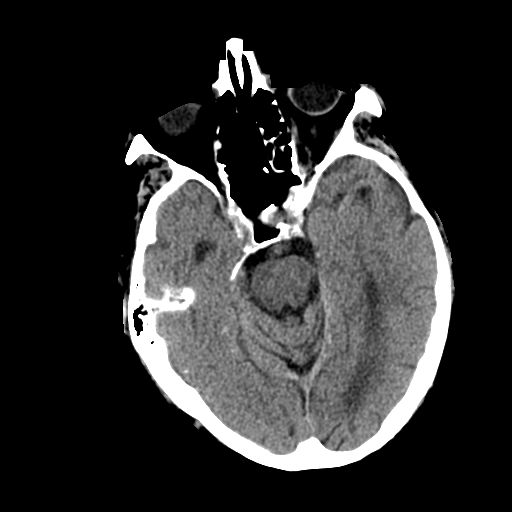
[im 17/33  brain]
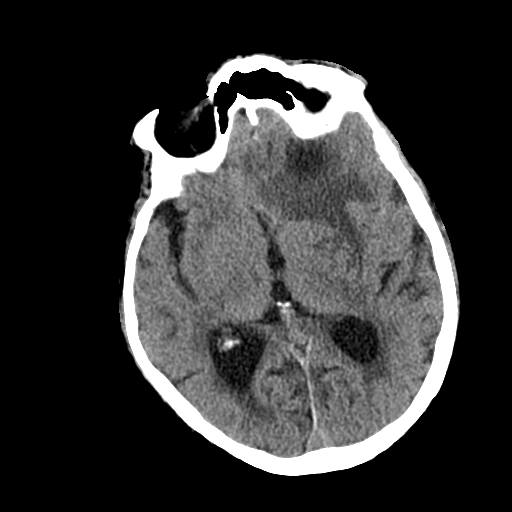
[im 21/33  brain]
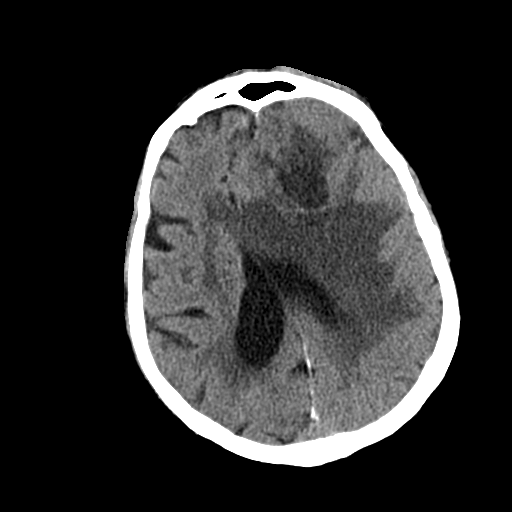
[im 21/33  bone]
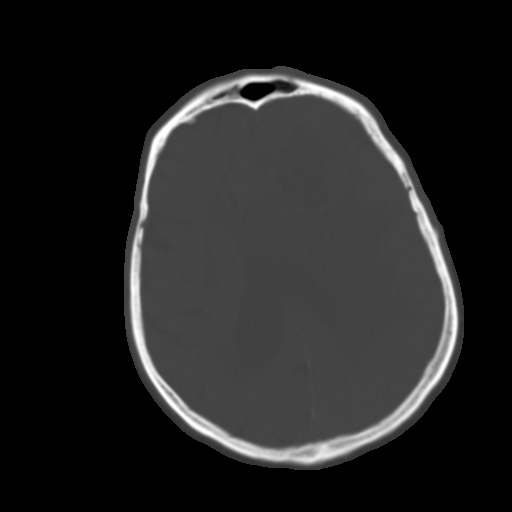
[im 25/33  brain]
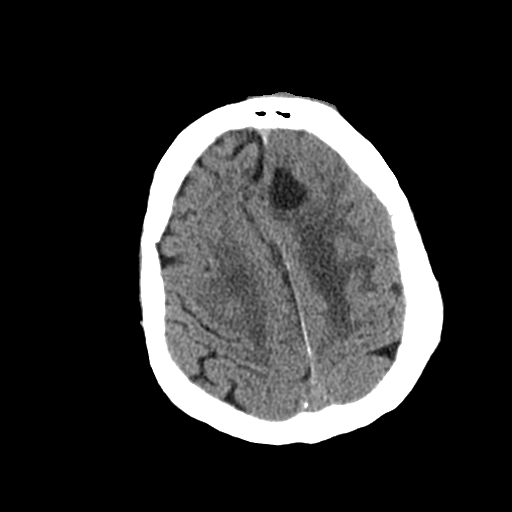
[im 29/33  brain]
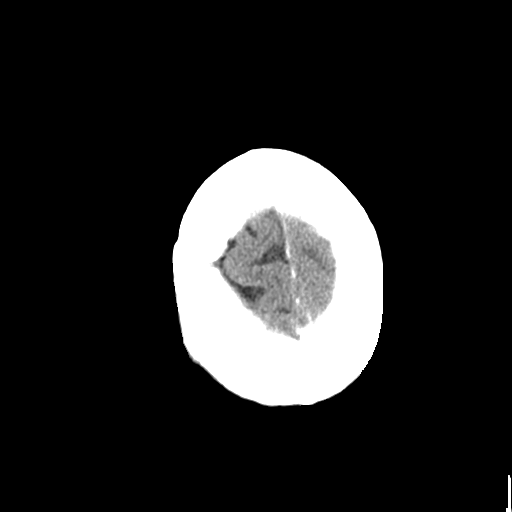

[Series 3: head bone · axial · 0.44mm/px · z∈[-185,-169]mm · 2 of 82 slices shown]
[im 9/82  bone]
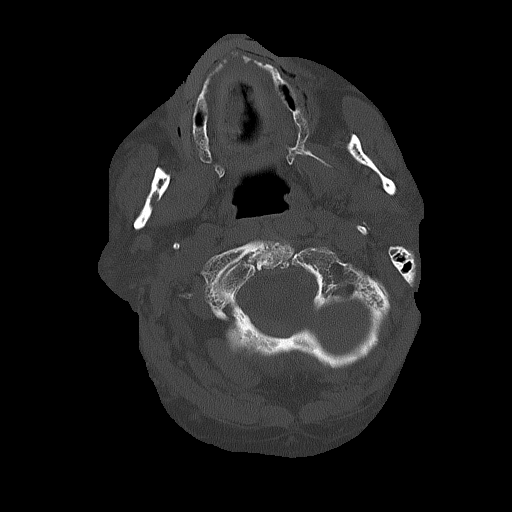
[im 17/82  bone]
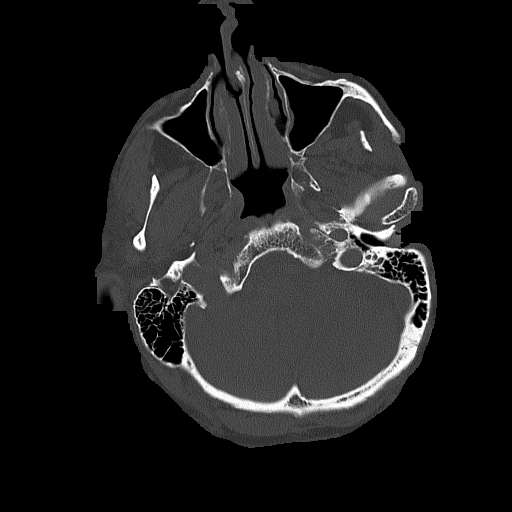

[Series 4: coronal soft tissue · coronal · 0.37mm/px · 3 of 73 slices shown]
[im 25/73  brain]
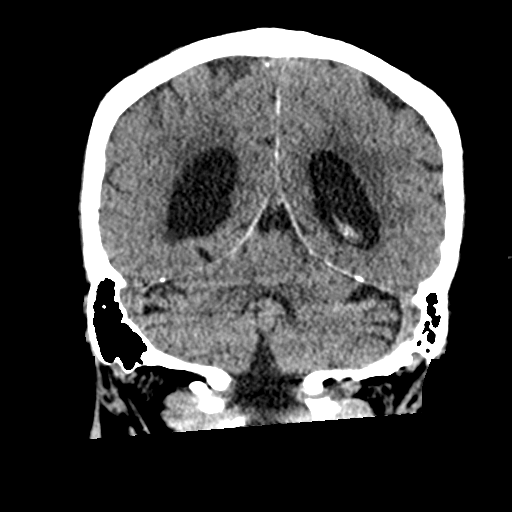
[im 33/73  brain]
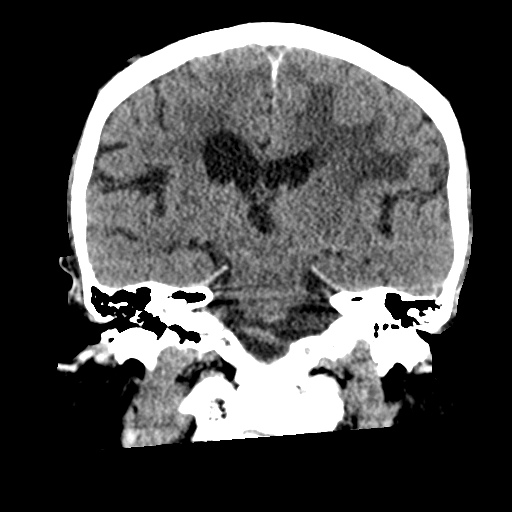
[im 41/73  brain]
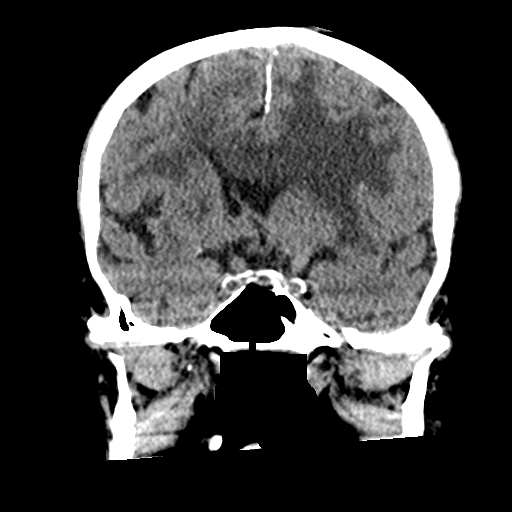

[Series 5: sagittal soft tissue · sagittal · 0.38mm/px · 3 of 63 slices shown]
[im 21/63  brain]
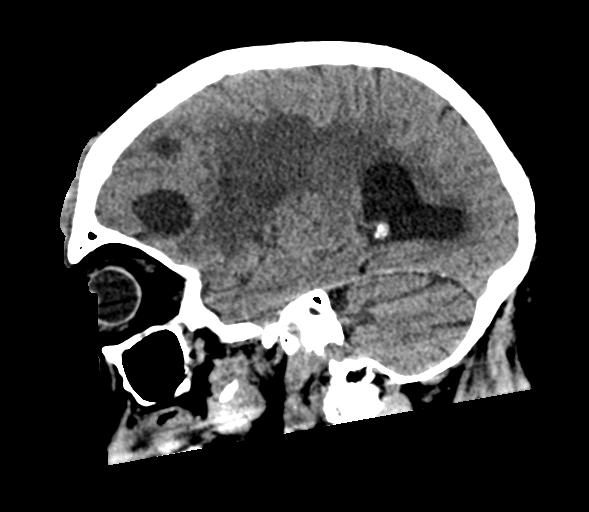
[im 32/63  brain]
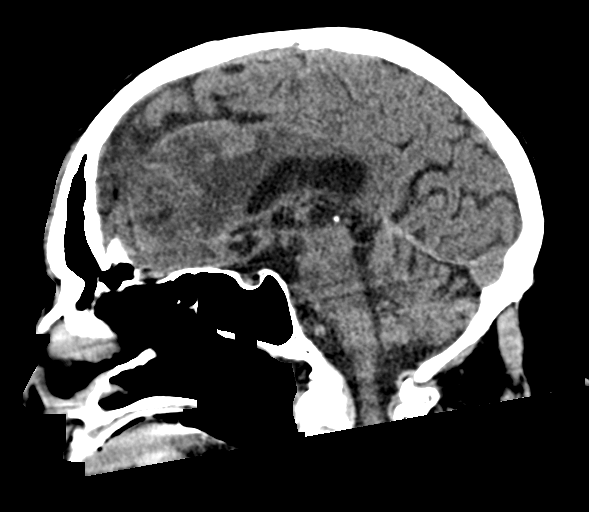
[im 42/63  brain]
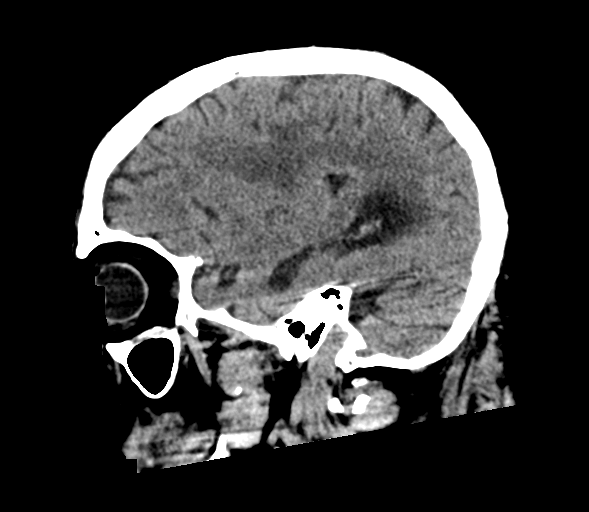

[15 of 47 positions shown; findings below may reference images not displayed]

FINDINGS: CT HEAD FINDINGS

Brain: An approximately 6 cm mixed solid and cystic extra-axial mass
over the anterior left frontal convexity as well as associated
extensive regional vasogenic edema extending into the corpus
callosum are unchanged. 1.8 cm of rightward midline shift is
unchanged. The ventricles are unchanged in size. There is a
background of moderate chronic small vessel ischemic disease in the
cerebral white matter. No acute infarct, intracranial hemorrhage, or
extra-axial fluid collection is identified.

Vascular: Calcified atherosclerosis at the skull base. No hyperdense
vessel.

Skull: No acute fracture or suspicious osseous lesion.

Sinuses/Orbits: Mild posterior left ethmoid air cell mucosal
thickening. Clear mastoid air cells. Unremarkable orbits.

Other: Moderate left frontal scalp hematoma.

CT CERVICAL SPINE FINDINGS

Alignment: Chronic cervical spine straightening. No acute traumatic
malalignment.

Skull base and vertebrae: Chronic nonunited type 2 dens fracture and
chronic nonunited left-sided posterior C1 arch fracture. Anterior C1
arch and right posterior C1 arch fractures in 4244 have healed. The
dens has fused to the anterior C1 arch. Mild chronic T2 superior
endplate compression fracture, unchanged. No acute fracture or
suspicious osseous lesion.

Soft tissues and spinal canal: No prevertebral fluid or swelling. No
visible canal hematoma.

Disc levels: Advanced disc degeneration at C5-6 and C6-7 with
interbody and right facet ankylosis at the former. Partial interbody
and facet ankylosis is also noted at C2-3, and there is severe facet
arthrosis at C3-4 and C4-5. Moderate right neural foraminal stenosis
at C3-4 due to uncovertebral and facet spurring.

Upper chest: Clear lung apices.

Other: Mild calcific atherosclerosis at the carotid bifurcations.
IMPRESSION: 1. No acute intracranial abnormality.
2. Left frontal scalp hematoma.
3. Unchanged 6 cm left frontal convexity meningioma with extensive
regional vasogenic edema and 1.8 cm of rightward midline shift.
4. No acute fracture or traumatic malalignment of the cervical
spine.
5. Chronic nonunited type 2 dens fracture and partially healed
chronic C1 fractures.

## 2022-04-21 ENCOUNTER — Other Ambulatory Visit: Payer: Medicare Other

## 2022-04-21 DIAGNOSIS — Z515 Encounter for palliative care: Secondary | ICD-10-CM

## 2022-04-21 NOTE — Progress Notes (Signed)
COMMUNITY PALLIATIVE CARE SW NOTE  PATIENT NAME: Alexander Lane DOB: 1930/04/13 MRN: [id number]  PRIMARY CARE PROVIDER: Rusty Aus, MD  RESPONSIBLE PARTY:  Acct ID - Guarantor Home Phone Work Phone Relationship Acct Type  [id number] Alexander Lane, Alexander [phone]  Self P/F     [address], [place], [place] [address]    Palliative care SW outreached patient to complete telephonic visit.    Palliative care SW outreached patient's daughter, Lenda Kelp, to complete telephonic visit. Daughter provided update on medical condition and/or changes.   Daughter endorses that patient is doing well. Daughter shares that she does have some eye pain due to a recent eye procedure but has been told by ophthalmology that the pain will subside soon. Continues to be independent with ADL's other than incontinence of B/B. No falls reported.  PCP: Spouse shared that she received a letter stating that patients PCP will no longer be Dr. Sabra Heck, due to insurance. Spouse to outreach Dr. Caryl Comes to inquire of their new patient waitilist and try to schedule an ppt.   Hospitalizations: none recent.   Appetite: patients appetite remains good. No significant weight changes noted.    No medication needs or adjustments noted. However, spouse had questions about whether some of patients medications may be causing him to have more bowel movements than normal. Patient is compliant with medications.    Psychosocial assessment: completed. No financial, food insecurities or issues with transportation noted during call. No other psychosocial needs. No S/S of depression or anxiety noted.   Spouse declined to schedule an in person visit at this time as she is hoping to schedule an appointment with new PCP Dr. Caryl Comes in the next week or two.     Palliative care will continue to monitor and assist with long term care planning as needed.     SOCIAL HX:  Social History   Tobacco Use   Smoking status: Former   Smokeless  tobacco: Never  Substance Use Topics   Alcohol use: Never    CODE STATUS: full code.      Doreene Eland, [place]

## 2022-06-09 ENCOUNTER — Telehealth: Payer: Self-pay | Admitting: Student

## 2022-06-09 ENCOUNTER — Other Ambulatory Visit: Payer: Medicare Other

## 2022-06-09 VITALS — BP 120/70 | HR 80 | Temp 97.5°F | Ht 67.0 in | Wt 155.0 lb

## 2022-06-09 DIAGNOSIS — Z515 Encounter for palliative care: Secondary | ICD-10-CM

## 2022-06-09 NOTE — Telephone Encounter (Signed)
Received call from patients wife. She reports she is having difficulty getting patient to office for INR check. He has had some declines. Will see if palliative RN can check on patient, discussed possibility of hospice.

## 2022-06-09 NOTE — Progress Notes (Signed)
PATIENT NAME: Alexander Lane DOB: Feb 20, 1930 MRN: [id number]  PRIMARY CARE PROVIDER: Rusty Aus, MD  RESPONSIBLE PARTY:  Acct ID - Guarantor Home Phone Work Phone Relationship Acct Type  [id number] HARLEE, [phone]  Self P/F     [address] [place], [place] [address]   Confusion:  Wife advised when she woke this morning, she found patient fully dresses outside walking towards the road.  Patient was uncertain where he was going.  Wife endorses patient is becoming more confused.  Fall:  Patient sustained a fall on Monday evening.  Wife found patient on the floor in urine.  She states he was laying on his right side but did not believe he hit his head.  She offered to call EMS but patient declined.     Functional Status:  Patient is found sitting on the sofa in the den.  He needs extensive assistance from sitting to standing. He uses a rolling walker but ambulation is very slow.   Has lift chair in the den that he sleeps in at night.  Patient declines to sit in the chair during the daytime.  Becoming more incontinent of bowel and bladder.  Currently has a depends brief in place. Wife endorses patient is requiring more assistance with ADL's.     Goals of Care:  Discussed code status and hospitalization.  Wife would like a DNR status and advises patient does not wish to be hospitalized any more.  Wife also endorses during the last hospitalization it was advised nothing more could be done for patient.  We discussed hospice support and wife is open to having a hospice evaluation if PC NP believes this is appropriate.  Discussed case with Charlann Boxer, NP and will proceed with hospice referral.  Advised that I would contact PCP for an order.   Weight loss:  34 lb weight loss noted over the last 9 months.  Wife endorses po intake is typically very good but patient's appetite has declined over the last couple of days.   06/09/22 weight 155 lbs  11/2021 weight 161 lbs  11/22 weight  189 lbs.      CODE STATUS: Full-desires DNR ADVANCED DIRECTIVES: yes-not on file MOST FORM: No PPS: 40%   PHYSICAL EXAM:   VITALS: Today's Vitals   06/09/22 1306  BP: 120/70  Pulse: 80  Temp: (!) 97.5 F (36.4 C)  SpO2: 96%         Lorenza Burton, RN

## 2022-06-15 ENCOUNTER — Other Ambulatory Visit: Payer: Medicare Other | Admitting: Student

## 2022-07-18 DEATH — deceased
